# Patient Record
Sex: Female | Born: 1970 | Race: White | Hispanic: No | Marital: Married
Health system: Midwestern US, Community
[De-identification: ages and names within clinical notes are randomized; demographics above are authoritative.]

## PROBLEM LIST (undated history)

## (undated) DIAGNOSIS — E282 Polycystic ovarian syndrome: Secondary | ICD-10-CM

## (undated) DIAGNOSIS — G8929 Other chronic pain: Secondary | ICD-10-CM

## (undated) DIAGNOSIS — G4700 Insomnia, unspecified: Secondary | ICD-10-CM

## (undated) DIAGNOSIS — K219 Gastro-esophageal reflux disease without esophagitis: Secondary | ICD-10-CM

## (undated) DIAGNOSIS — N200 Calculus of kidney: Secondary | ICD-10-CM

## (undated) DIAGNOSIS — E039 Hypothyroidism, unspecified: Secondary | ICD-10-CM

## (undated) DIAGNOSIS — R519 Headache, unspecified: Secondary | ICD-10-CM

## (undated) DIAGNOSIS — C50919 Malignant neoplasm of unspecified site of unspecified female breast: Secondary | ICD-10-CM

## (undated) DIAGNOSIS — R51 Headache: Secondary | ICD-10-CM

## (undated) DIAGNOSIS — N879 Dysplasia of cervix uteri, unspecified: Secondary | ICD-10-CM

## (undated) DIAGNOSIS — C801 Malignant (primary) neoplasm, unspecified: Secondary | ICD-10-CM

## (undated) DIAGNOSIS — Z8489 Family history of other specified conditions: Secondary | ICD-10-CM

## (undated) DIAGNOSIS — Z170 Estrogen receptor positive status [ER+]: Secondary | ICD-10-CM

## (undated) DIAGNOSIS — E1165 Type 2 diabetes mellitus with hyperglycemia: Principal | ICD-10-CM

## (undated) DIAGNOSIS — R059 Cough, unspecified: Principal | ICD-10-CM

## (undated) DIAGNOSIS — L680 Hirsutism: Secondary | ICD-10-CM

## (undated) DIAGNOSIS — C50911 Malignant neoplasm of unspecified site of right female breast: Principal | ICD-10-CM

## (undated) DIAGNOSIS — R079 Chest pain, unspecified: Secondary | ICD-10-CM

## (undated) DIAGNOSIS — K2970 Gastritis, unspecified, without bleeding: Secondary | ICD-10-CM

## (undated) DIAGNOSIS — N858 Other specified noninflammatory disorders of uterus: Secondary | ICD-10-CM

## (undated) DIAGNOSIS — R739 Hyperglycemia, unspecified: Secondary | ICD-10-CM

## (undated) HISTORY — DX: Headache: R51

## (undated) HISTORY — DX: Gastro-esophageal reflux disease without esophagitis: K21.9

## (undated) HISTORY — PX: COLPOSCOPY: SHX161

## (undated) HISTORY — PX: BREAST BIOPSY: SHX20

## (undated) HISTORY — PX: EYE SURGERY: SHX253

## (undated) HISTORY — DX: Dysplasia of cervix uteri, unspecified: N87.9

## (undated) HISTORY — DX: Headache, unspecified: R51.9

## (undated) HISTORY — DX: Hypothyroidism, unspecified: E03.9

## (undated) HISTORY — DX: Polycystic ovarian syndrome: E28.2

## (undated) HISTORY — DX: Other chronic pain: G89.29

---

## 2000-09-14 HISTORY — PX: CERVICAL BIOPSY  W/ LOOP ELECTRODE EXCISION: SUR135

## 2003-09-23 ENCOUNTER — Emergency Department (HOSPITAL_COMMUNITY): Admission: EM | Admit: 2003-09-23 | Discharge: 2003-09-23 | Payer: Self-pay | Admitting: Emergency Medicine

## 2003-09-25 ENCOUNTER — Emergency Department (HOSPITAL_COMMUNITY): Admission: EM | Admit: 2003-09-25 | Discharge: 2003-09-25 | Payer: Self-pay

## 2004-02-01 ENCOUNTER — Other Ambulatory Visit: Admission: RE | Admit: 2004-02-01 | Discharge: 2004-02-01 | Payer: Self-pay | Admitting: Obstetrics and Gynecology

## 2004-12-29 ENCOUNTER — Emergency Department (HOSPITAL_COMMUNITY): Admission: EM | Admit: 2004-12-29 | Discharge: 2004-12-29 | Payer: Self-pay | Admitting: Emergency Medicine

## 2005-02-03 ENCOUNTER — Other Ambulatory Visit: Admission: RE | Admit: 2005-02-03 | Discharge: 2005-02-03 | Payer: Self-pay | Admitting: Obstetrics and Gynecology

## 2006-02-11 ENCOUNTER — Other Ambulatory Visit: Admission: RE | Admit: 2006-02-11 | Discharge: 2006-02-11 | Payer: Self-pay | Admitting: Obstetrics and Gynecology

## 2006-06-28 ENCOUNTER — Encounter: Admission: RE | Admit: 2006-06-28 | Discharge: 2006-06-28 | Payer: Self-pay | Admitting: Gastroenterology

## 2007-02-25 ENCOUNTER — Other Ambulatory Visit: Admission: RE | Admit: 2007-02-25 | Discharge: 2007-02-25 | Payer: Self-pay | Admitting: Addiction Medicine

## 2007-06-29 ENCOUNTER — Encounter: Admission: RE | Admit: 2007-06-29 | Discharge: 2007-06-29 | Payer: Self-pay | Admitting: Endocrinology

## 2007-10-21 ENCOUNTER — Encounter: Admission: RE | Admit: 2007-10-21 | Discharge: 2007-10-21 | Payer: Self-pay | Admitting: Obstetrics and Gynecology

## 2008-02-28 ENCOUNTER — Other Ambulatory Visit: Admission: RE | Admit: 2008-02-28 | Discharge: 2008-02-28 | Payer: Self-pay | Admitting: Obstetrics and Gynecology

## 2008-10-11 ENCOUNTER — Ambulatory Visit: Payer: Self-pay | Admitting: Obstetrics and Gynecology

## 2008-10-12 ENCOUNTER — Ambulatory Visit: Payer: Self-pay | Admitting: Obstetrics and Gynecology

## 2009-03-07 ENCOUNTER — Ambulatory Visit: Payer: Self-pay | Admitting: Obstetrics and Gynecology

## 2009-03-07 ENCOUNTER — Other Ambulatory Visit: Admission: RE | Admit: 2009-03-07 | Discharge: 2009-03-07 | Payer: Self-pay | Admitting: Obstetrics and Gynecology

## 2009-03-07 ENCOUNTER — Encounter: Payer: Self-pay | Admitting: Obstetrics and Gynecology

## 2010-03-21 ENCOUNTER — Other Ambulatory Visit: Admission: RE | Admit: 2010-03-21 | Discharge: 2010-03-21 | Payer: Self-pay | Admitting: Obstetrics and Gynecology

## 2010-03-21 ENCOUNTER — Ambulatory Visit: Payer: Self-pay | Admitting: Obstetrics and Gynecology

## 2010-10-05 ENCOUNTER — Encounter: Payer: Self-pay | Admitting: Endocrinology

## 2010-12-25 ENCOUNTER — Other Ambulatory Visit: Payer: Self-pay | Admitting: Family Medicine

## 2010-12-26 ENCOUNTER — Ambulatory Visit
Admission: RE | Admit: 2010-12-26 | Discharge: 2010-12-26 | Disposition: A | Discharge status: Home / Self Care | Payer: BC Managed Care – PPO | Source: Ambulatory Visit | Attending: Family Medicine | Admitting: Family Medicine

## 2011-03-24 ENCOUNTER — Encounter (INDEPENDENT_AMBULATORY_CARE_PROVIDER_SITE_OTHER): Payer: BC Managed Care – PPO | Admitting: Obstetrics and Gynecology

## 2011-03-24 ENCOUNTER — Other Ambulatory Visit: Payer: Self-pay | Admitting: Obstetrics and Gynecology

## 2011-03-24 ENCOUNTER — Other Ambulatory Visit (HOSPITAL_COMMUNITY)
Admission: RE | Admit: 2011-03-24 | Discharge: 2011-03-24 | Disposition: A | Discharge status: Home / Self Care | Payer: BC Managed Care – PPO | Source: Ambulatory Visit | Attending: Obstetrics and Gynecology | Admitting: Obstetrics and Gynecology

## 2011-03-24 DIAGNOSIS — Z01419 Encounter for gynecological examination (general) (routine) without abnormal findings: Secondary | ICD-10-CM

## 2011-03-24 DIAGNOSIS — B373 Candidiasis of vulva and vagina: Secondary | ICD-10-CM

## 2011-03-24 DIAGNOSIS — N912 Amenorrhea, unspecified: Secondary | ICD-10-CM

## 2011-03-24 DIAGNOSIS — Z124 Encounter for screening for malignant neoplasm of cervix: Secondary | ICD-10-CM | POA: Insufficient documentation

## 2011-03-27 ENCOUNTER — Other Ambulatory Visit: Payer: BC Managed Care – PPO

## 2011-03-27 ENCOUNTER — Ambulatory Visit (INDEPENDENT_AMBULATORY_CARE_PROVIDER_SITE_OTHER): Payer: BC Managed Care – PPO | Admitting: Obstetrics and Gynecology

## 2011-03-27 DIAGNOSIS — B373 Candidiasis of vulva and vagina: Secondary | ICD-10-CM

## 2011-03-27 DIAGNOSIS — N912 Amenorrhea, unspecified: Secondary | ICD-10-CM

## 2011-03-27 DIAGNOSIS — N949 Unspecified condition associated with female genital organs and menstrual cycle: Secondary | ICD-10-CM

## 2011-03-27 DIAGNOSIS — N915 Oligomenorrhea, unspecified: Secondary | ICD-10-CM

## 2011-04-20 ENCOUNTER — Other Ambulatory Visit: Payer: Self-pay | Admitting: *Deleted

## 2011-04-20 DIAGNOSIS — R609 Edema, unspecified: Secondary | ICD-10-CM

## 2011-04-20 MED ORDER — SPIRONOLACTONE 50 MG PO TABS: ORAL_TABLET | ORAL | Status: DC

## 2011-09-29 ENCOUNTER — Encounter: Payer: Self-pay | Admitting: Pulmonary Disease

## 2011-09-30 ENCOUNTER — Encounter: Payer: Self-pay | Admitting: Pulmonary Disease

## 2011-09-30 ENCOUNTER — Ambulatory Visit (INDEPENDENT_AMBULATORY_CARE_PROVIDER_SITE_OTHER): Payer: BC Managed Care – PPO | Admitting: Pulmonary Disease

## 2011-09-30 VITALS — BP 140/92 | HR 103 | Temp 98.4°F | Ht 62.0 in | Wt 213.0 lb

## 2011-09-30 DIAGNOSIS — R053 Chronic cough: Secondary | ICD-10-CM | POA: Insufficient documentation

## 2011-09-30 DIAGNOSIS — R059 Cough, unspecified: Secondary | ICD-10-CM

## 2011-09-30 DIAGNOSIS — R05 Cough: Secondary | ICD-10-CM

## 2011-09-30 MED ORDER — BENZONATATE 100 MG PO CAPS: 200.0000 mg | ORAL_CAPSULE | Freq: Four times a day (QID) | ORAL | Status: AC | PRN

## 2011-09-30 MED ORDER — HYDROCOD POLST-CPM POLST ER 10-8 MG PO CP12: 1.0000 | ORAL_CAPSULE | Freq: Two times a day (BID) | ORAL | Status: DC | PRN

## 2011-09-30 NOTE — Patient Instructions (Signed)
Will treat with the cyclical cough protocol.  See handout. No throat clearing, hard candy (no menthol or mint), minimal voice use, etc. Please call us about 3 days after protocol completed, to give update. Remember, once protocol is done, need to limit voice use as much as possible

## 2011-09-30 NOTE — Assessment & Plan Note (Signed)
The patient is describing a classic cyclical cough.  There is nothing to suggest cough variant asthma, and she really does not have a lot of postnasal drip or reflux.  These factors could be contributing to the cough, but I do not think is the main driving force.  Treatment for this will require total cough suppression with narcotics, complete voice rest, and behavioral therapies which will limit stimulation to the back of the throat.  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  I think this is one reason why this has not improved over time.  We'll start the patient on the cyclical cough protocol, and have her call me with her progress after this is completed.

## 2011-09-30 NOTE — Progress Notes (Signed)
  Subjective:    Patient ID: Joan Valdez, female    DOB: 01/02/1971, 41 y.o.   MRN: [id number]  HPI The patient is a 41 year old female who is referred today for a chronic cough.  She was in her usual state of health until November of last year, when she awakened from sleep one day with chest discomfort and cough.  She had no upper respiratory infection symptoms, and no mucus production.  She never really felt sick.  The cough then escalated and became very severe, and developed cough paroxysms.  She was treated with a course of Levaquin, and then another antibiotic with some improvement.  She was tried on a steroid inhaler, topical nasal steroids, and b.i.d. Proton pump inhibitor for approximately 4 weeks.  She had no improvement with this.  She has had a Bordetella screen done which was unremarkable, and her chest x-ray in December was clear.  She has had from a tree in the distant past which was normal.  The patient's cough is dry and hacking in nature, and she describes a classic globus sensation.  She has had continuous throat clearing that is from a tickle, as well as a lot of hoarseness.  The cough is definitely worse with increased conversation.  The patient most recently has had some postnasal drip, but it has not been significant.  She denies any significant reflux symptoms, but has had some regurgitation at times during her cough paroxysms.   Review of Systems  Constitutional: Negative for fever and unexpected weight change.  HENT: Negative for ear pain, nosebleeds, congestion, sore throat, rhinorrhea, sneezing, trouble swallowing, dental problem, postnasal drip and sinus pressure.   Eyes: Negative for redness and itching.  Respiratory: Positive for cough. Negative for chest tightness, shortness of breath and wheezing.   Cardiovascular: Negative for palpitations and leg swelling.  Gastrointestinal: Negative for nausea and vomiting.  Genitourinary: Negative for dysuria.  Musculoskeletal:  Negative for joint swelling.  Skin: Negative for rash.  Neurological: Positive for headaches.  Hematological: Does not bruise/bleed easily.  Psychiatric/Behavioral: Negative for dysphoric mood. The patient is not nervous/anxious.        Objective:   Physical Exam Constitutional:  Overweight female, no acute distress  HENT:  Nares patent without discharge  Oropharynx without exudate, palate and uvula are normal  Eyes:  Perrla, eomi, no scleral icterus  Neck:  No JVD, no TMG  Cardiovascular:  Normal rate, regular rhythm, no rubs or gallops.  No murmurs        Intact distal pulses  Pulmonary :  Normal breath sounds, no stridor or respiratory distress   No rales, rhonchi, or wheezing  Abdominal:  Soft, nondistended, bowel sounds present.  No tenderness noted.   Musculoskeletal:  No lower extremity edema noted.  Lymph Nodes:  No cervical lymphadenopathy noted  Skin:  No cyanosis noted  Neurologic:  Alert, appropriate, moves all 4 extremities without obvious deficit.         Assessment & Plan:

## 2011-10-07 ENCOUNTER — Telehealth: Payer: Self-pay | Admitting: Pulmonary Disease

## 2011-10-07 NOTE — Telephone Encounter (Signed)
I spoke with the pt and she states she finished the 3 days of rest on Monday fro cyclical cough and was told to call with an update. She states on Monday she would rate her cough at 90% better, but the longer she has been off the complete voice rest her cough is gradually getting worse again. She states she would rate it today at 70% better, which I advised was still a great improvement.  She states she is still taking tessalon perles as directed, resting her voice as much as possible, keeping hard candy in her mouth, and all the other cyclical cough instructions. She wanted to let [facility] know where she was and if he has any other recs at this time.  Please advise. Carron Curie, CMA Allergies  Allergen Reactions  . Amoxicillin Swelling    TONGUE/THROAT SWELLING  . Bactrim     TONGUE/THROAT SWELLING.  . Erythromycin     TONGUE/THROAT SWELLING.  . Imitrex ([employer])   . Keflex     TONGUE/THROAT SWELLING.  Marland Kitchen Penicillins     TONGUE/THROAT SWELLING.  . Sulfa Antibiotics     TONGUE/THROAT SWELLING.  Marland Kitchen Zithromax (Azithromycin Dihydrate)     TONGUE/THROAT SWELLING.

## 2011-10-07 NOTE — Telephone Encounter (Signed)
Let her know this is very good improvement in a short period of time.  We had discussed how hard this will be with her occupation.  She needs to continue exactly what she is doing now, and see how things go for the next 2-3 weeks.  The longer she can keep her cough from escalating, the better it should get.  Give [place] feedback again in 2-3 weeks,  But call if it suddenly begins to increase again.

## 2011-10-08 NOTE — Telephone Encounter (Signed)
I spoke with pt and she states that at first she was 90% better, then the second day she was 70% better, and today she is back down to 50%. She states she is afraid she is going down hill fast. Pt is still using the tessalon pearls, trying to rest her voice as much as possible, using hard candy to bathe the back of her throat, she is drinking water when she feels the urge to cough and she is not clearing her throat. Pt is requesting recs from Dr. Shelle Iron. Please advise thanks

## 2011-10-08 NOTE — Telephone Encounter (Signed)
The only thing we can do is to try tramadol during the day instead of tessalon pearls, but with the understanding it may make her sleepy or feel funny.  If she wishes to do this, tramadol 50mg  one every 6 hrs as needed for cough. (30, one fill)  The other thing we can do is to do the cyclical cough protocol again with 3 days of voice rest.  Let me know what she wishes to do.

## 2011-10-08 NOTE — Telephone Encounter (Signed)
I spoke with pt and she states she is going to try the cyclical cough protocol again w/ 3 days voice rest. Pt states she has plenty of tessalon pearls left but will need rx for the tussicaps. Pt aware KC is out of the office until tomorrow and was fine with it being addressed then. Please advise the quantity for pt's tussicaps, Dr. Shelle Iron, thanks  [facility] [place]

## 2011-10-08 NOTE — Telephone Encounter (Signed)
Pt called back. Joan Valdez

## 2011-10-09 MED ORDER — HYDROCOD POLST-CPM POLST ER 10-8 MG PO CP12: 1.0000 | ORAL_CAPSULE | Freq: Two times a day (BID) | ORAL | Status: DC | PRN

## 2011-10-09 NOTE — Telephone Encounter (Signed)
Pt called to make sure rx will be called in asap this morning (due to weather). Pt # [id number]. Joan Valdez

## 2011-10-09 NOTE — Telephone Encounter (Signed)
Ok to call in tussicaps 8mg  and put on prescription q12h as needed.  #12, no fills But, remind her to take according to protocol, not what is on the prescription.  Continue behavioral treatments as well.  Give me feedback after she has been off meds for a week or so.

## 2011-10-09 NOTE — Telephone Encounter (Signed)
Called spoke with patient, advised of KC's recs regarding the tussicaps and to call with update after being off meds x1 week or so.  Pt verbalized her understanding.  rx telephoned to verified pharmacy.  Nothing further needed - will sign off.

## 2011-10-19 ENCOUNTER — Telehealth: Payer: Self-pay | Admitting: Pulmonary Disease

## 2011-10-19 MED ORDER — BENZONATATE 100 MG PO CAPS: 100.0000 mg | ORAL_CAPSULE | Freq: Four times a day (QID) | ORAL | Status: AC | PRN

## 2011-10-19 NOTE — Telephone Encounter (Signed)
Called and spoke with pt. Pt states she was to call and give update on her cough.  She states she is doing "much better."  States cough is 75% to 80% better. Pt finished Tussicaps and only has approx 3 tabs left of the [employer].  Pt states she is still using the Tessalon perles to help with her cough and would like an rx sent to pharmacy for this. Pt states she is trying to limit voice as much as possible and is still sucking on hard candy to bathe back of throat.  Pt states cough is non productive.  KC, please advise if ok to refill Tessalon perles.  Thanks!

## 2011-10-19 NOTE — Telephone Encounter (Signed)
Called and spoke with pt. Pt aware of KC's response/ recs.  And rx sent to pharmacy.   

## 2011-10-19 NOTE — Telephone Encounter (Signed)
Ok to fill tessalon pearls. She is to call if the cough does not totally resolve.

## 2011-10-21 ENCOUNTER — Emergency Department (HOSPITAL_COMMUNITY)
Admission: EM | Admit: 2011-10-21 | Discharge: 2011-10-21 | Disposition: A | Discharge status: Home / Self Care | Payer: BC Managed Care – PPO | Attending: Emergency Medicine | Admitting: Emergency Medicine

## 2011-10-21 ENCOUNTER — Emergency Department (HOSPITAL_COMMUNITY)
Admission: EM | Admit: 2011-10-21 | Discharge: 2011-10-21 | Disposition: A | Discharge status: Nursing Home (Includes ICF) | Payer: BC Managed Care – PPO | Source: Home / Self Care

## 2011-10-21 ENCOUNTER — Encounter (HOSPITAL_COMMUNITY): Payer: Self-pay | Admitting: *Deleted

## 2011-10-21 ENCOUNTER — Encounter (HOSPITAL_COMMUNITY): Payer: Self-pay | Admitting: Emergency Medicine

## 2011-10-21 DIAGNOSIS — K5289 Other specified noninfective gastroenteritis and colitis: Secondary | ICD-10-CM | POA: Insufficient documentation

## 2011-10-21 DIAGNOSIS — Z79899 Other long term (current) drug therapy: Secondary | ICD-10-CM | POA: Insufficient documentation

## 2011-10-21 DIAGNOSIS — R1915 Other abnormal bowel sounds: Secondary | ICD-10-CM | POA: Insufficient documentation

## 2011-10-21 DIAGNOSIS — E860 Dehydration: Secondary | ICD-10-CM

## 2011-10-21 DIAGNOSIS — K529 Noninfective gastroenteritis and colitis, unspecified: Secondary | ICD-10-CM

## 2011-10-21 DIAGNOSIS — R197 Diarrhea, unspecified: Secondary | ICD-10-CM | POA: Insufficient documentation

## 2011-10-21 DIAGNOSIS — R109 Unspecified abdominal pain: Secondary | ICD-10-CM | POA: Insufficient documentation

## 2011-10-21 DIAGNOSIS — E039 Hypothyroidism, unspecified: Secondary | ICD-10-CM | POA: Insufficient documentation

## 2011-10-21 DIAGNOSIS — R1110 Vomiting, unspecified: Secondary | ICD-10-CM | POA: Insufficient documentation

## 2011-10-21 LAB — URINALYSIS, ROUTINE W REFLEX MICROSCOPIC
Glucose, UA: NEGATIVE mg/dL
Leukocytes, UA: NEGATIVE
Protein, ur: NEGATIVE mg/dL
Specific Gravity, Urine: 1.026 (ref 1.005–1.030)
pH: 5.5 (ref 5.0–8.0)

## 2011-10-21 LAB — POCT URINALYSIS DIP (DEVICE)
Glucose, UA: NEGATIVE mg/dL
Leukocytes, UA: NEGATIVE
Nitrite: NEGATIVE
Specific Gravity, Urine: 1.02 (ref 1.005–1.030)
Urobilinogen, UA: 0.2 mg/dL (ref 0.0–1.0)

## 2011-10-21 LAB — BASIC METABOLIC PANEL
CO2: 20 mEq/L (ref 19–32)
Chloride: 103 mEq/L (ref 96–112)
Potassium: 3.6 mEq/L (ref 3.5–5.1)
Sodium: 138 mEq/L (ref 135–145)

## 2011-10-21 LAB — CBC
HCT: 46.1 % — ABNORMAL HIGH (ref 36.0–46.0)
Platelets: 204 10*3/uL (ref 150–400)
RBC: 5.42 MIL/uL — ABNORMAL HIGH (ref 3.87–5.11)
RDW: 12.5 % (ref 11.5–15.5)
WBC: 8.9 10*3/uL (ref 4.0–10.5)

## 2011-10-21 LAB — POCT PREGNANCY, URINE
Preg Test, Ur: NEGATIVE
Preg Test, Ur: NEGATIVE

## 2011-10-21 LAB — DIFFERENTIAL
Basophils Absolute: 0 10*3/uL (ref 0.0–0.1)
Lymphocytes Relative: 9 % — ABNORMAL LOW (ref 12–46)
Neutro Abs: 7.6 10*3/uL (ref 1.7–7.7)

## 2011-10-21 MED ORDER — SODIUM CHLORIDE 0.9 % IV SOLN: INTRAVENOUS | Status: DC

## 2011-10-21 MED ORDER — ONDANSETRON 4 MG PO TBDP
8.0000 mg | ORAL_TABLET | Freq: Once | ORAL | Status: AC
  Administered 2011-10-21: 8 mg via ORAL

## 2011-10-21 MED ORDER — ONDANSETRON HCL 4 MG/2ML IJ SOLN
4.0000 mg | Freq: Once | INTRAMUSCULAR | Status: AC
  Administered 2011-10-21: 4 mg via INTRAVENOUS
  Filled 2011-10-21: qty 2

## 2011-10-21 MED ORDER — SODIUM CHLORIDE 0.9 % IV BOLUS (SEPSIS)
1000.0000 mL | Freq: Once | INTRAVENOUS | Status: AC
  Administered 2011-10-21: 1000 mL via INTRAVENOUS

## 2011-10-21 MED ORDER — ONDANSETRON HCL 4 MG PO TABS: 4.0000 mg | ORAL_TABLET | Freq: Four times a day (QID) | ORAL | Status: AC

## 2011-10-21 MED ORDER — ONDANSETRON 4 MG PO TBDP
ORAL_TABLET | ORAL | Status: AC
  Filled 2011-10-21: qty 2

## 2011-10-21 NOTE — ED Provider Notes (Signed)
History     CSN: [id number]  Arrival date & time 10/21/11  9562   None     Chief Complaint  Patient presents with  . GI Problem  . Emesis    (Consider location/radiation/quality/duration/timing/severity/associated sxs/prior treatment) HPI Comments: Pt states she awoke at approx 11:00 pm last night from sleep with N/V/D and stomach cramping pain. She has had at least 20-30 episodes of vomiting and diarrhea simultaneously. She has been unable to drink fluids due to nausea and vomiting. She is uncertain her last void. The diarrhea is watery and rust colored. No recent abx or travel. 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 She also c/o HA and dizziness.    Past Medical History  Diagnosis Date  . PCO (polycystic ovaries)   . Hypothyroid   . Allergic rhinitis   . Chronic headache     Past Surgical History  Procedure Date  . Cesarean section 2001  . Cervical biopsy  w/ loop electrode excision     Family History  Problem Relation Age of Onset  . Hypertension Mother   . Diabetes Paternal Grandmother   . Hypertension Paternal Grandmother   . Heart disease Maternal Grandmother   . Rheum arthritis Paternal Grandmother     History  Substance Use Topics  . Smoking status: Never Smoker   . Smokeless tobacco: Not on file  . Alcohol Use: Yes     socially.    OB History    Grav Para Term Preterm Abortions TAB SAB Ect Mult Living                  Review of Systems  Constitutional: Positive for appetite change. Negative for fever and chills.  Gastrointestinal: Positive for nausea, vomiting, abdominal pain and diarrhea. Negative for constipation.  Genitourinary: Positive for decreased urine volume. Negative for dysuria and frequency.  Neurological: Positive for dizziness and headaches.    Allergies  Amoxicillin; Bactrim; Erythromycin; Imitrex; Keflex; Penicillins; Sulfa antibiotics; and Zithromax  Home Medications   Current Outpatient Rx  Name Route Sig  Dispense Refill  . LEVOTHYROXINE SODIUM 100 MCG PO TABS Oral Take 100 mcg by mouth daily.      Marland Kitchen LIOTHYRONINE SODIUM 25 MCG PO TABS Oral Take 12.5 mcg by mouth 2 (two) times daily.     Marland Kitchen OMEPRAZOLE 20 MG PO CPDR Oral Take 20 mg by mouth daily.      Marland Kitchen BENZONATATE 100 MG PO CAPS Oral Take 1 capsule (100 mg total) by mouth every 6 (six) hours as needed for cough. 30 capsule 0  . DESOGESTREL-ETHINYL ESTRADIOL 0.15-0.02/0.01 MG (21/5) PO TABS Oral Take 1 tablet by mouth daily.      Marland Kitchen HYDROCOD POLST-CPM POLST ER 10-8 MG PO CP12 Oral Take 1 capsule by mouth every 12 (twelve) hours as needed. 12 each 0  . SPIRONOLACTONE 50 MG PO TABS  Take one tablet twice a day 60 tablet 12  . TOPIRAMATE 50 MG PO TABS Oral Take 75 mg by mouth daily.      BP 132/88  Pulse 142  Temp(Src) 98.2 F (36.8 C) (Oral)  Resp 20  SpO2 98%  Physical Exam  Constitutional: She appears well-developed and well-nourished. No distress.  HENT:  Head: Normocephalic and atraumatic.  Right Ear: Tympanic membrane, external ear and ear canal normal.  Left Ear: Tympanic membrane, external ear and ear canal normal.  Nose: Nose normal.  Mouth/Throat: Uvula is midline and oropharynx is clear and moist. Mucous membranes  are dry. No oropharyngeal exudate, posterior oropharyngeal edema or posterior oropharyngeal erythema.  Neck: Neck supple.  Cardiovascular: Regular rhythm and normal heart sounds.  Tachycardia present.   Pulmonary/Chest: Effort normal and breath sounds normal. No respiratory distress.  Abdominal: Normal appearance and bowel sounds are normal. She exhibits no mass. There is no hepatosplenomegaly. There is tenderness (mild diffuse TTP). There is no rebound, no guarding and no CVA tenderness.  Lymphadenopathy:    She has no cervical adenopathy.  Neurological: She is alert.  Skin: Skin is warm and dry.  Psychiatric: She has a normal mood and affect.    ED Course  Procedures (including critical care time)  Labs  Reviewed  POCT URINALYSIS DIP (DEVICE) - Abnormal; Notable for the following:    Bilirubin Urine SMALL (*)    Ketones, ur 15 (*)    All other components within normal limits  POCT PREGNANCY, URINE  POCT PREGNANCY, URINE   No results found.   1. Dehydration   2. Gastroenteritis       MDM  Pt transferred to [facility].         Melody Comas, [place] 10/21/11 [phone]

## 2011-10-21 NOTE — ED Notes (Signed)
Pt states n/v/d started at 2300 last night. States "I threw up every 10 minutes". Has not thrown up since 0600 today. [facility] and given Zofran 8mg  PO, pt states did not help with the nausea. Pt is a 2nd grade teacher.

## 2011-10-21 NOTE — ED Notes (Signed)
Pt reports n/v/d associated with generalized abdominal pain that started last night. Reports unable to keep anything down. States she feels very thirsty. Pt was sent from urgent care.

## 2011-10-21 NOTE — ED Provider Notes (Signed)
History     CSN: [id number]  Arrival date & time 10/21/11  4098   First MD Initiated Contact with Patient 10/21/11 1058      Chief Complaint  Patient presents with  . Emesis  . Diarrhea  . Abdominal Pain    (Consider location/radiation/quality/duration/timing/severity/associated sxs/prior treatment) HPI Joan Valdez is a 41 y.o. female who has been ill since yesterday with vomiting and diarrhea. The emesis is persistent, and appears greenish in color, and is liquid in consistency. Her stool is dark green color, but no evidence of blood. She's not been having a fever. 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 She denies chest pain, weakness, dizziness.      Past Medical History  Diagnosis Date  . PCO (polycystic ovaries)   . Hypothyroid   . Allergic rhinitis   . Chronic headache     Past Surgical History  Procedure Date  . Cesarean section 2001  . Cervical biopsy  w/ loop electrode excision     Family History  Problem Relation Age of Onset  . Hypertension Mother   . Diabetes Paternal Grandmother   . Hypertension Paternal Grandmother   . Heart disease Maternal Grandmother   . Rheum arthritis Paternal Grandmother     History  Substance Use Topics  . Smoking status: Never Smoker   . Smokeless tobacco: Not on file  . Alcohol Use: Yes     socially.    OB History    Grav Para Term Preterm Abortions TAB SAB Ect Mult Living                  Review of Systems  All other systems reviewed and are negative.    Allergies  Imitrex; Amoxicillin; Bactrim; Erythromycin; Keflex; Penicillins; Strawberry; Sulfa antibiotics; and Zithromax  Home Medications   Current Outpatient Rx  Name Route Sig Dispense Refill  . BENZONATATE 100 MG PO CAPS Oral Take 1 capsule (100 mg total) by mouth every 6 (six) hours as needed for cough. 30 capsule 0  . DESOGESTREL-ETHINYL ESTRADIOL 0.15-0.02/0.01 MG (21/5) PO TABS Oral Take 1 tablet by mouth daily.     Marland Kitchen  DIPHENHYDRAMINE HCL 25 MG PO TABS Oral Take 50 mg by mouth every 6 (six) hours as needed. Sinus headache    . HYDROCOD POLST-CPM POLST ER 10-8 MG PO CP12 Oral Take 1 capsule by mouth every 12 (twelve) hours as needed. 12 each 0  . IBUPROFEN 200 MG PO TABS Oral Take 400 mg by mouth every 8 (eight) hours as needed. For pain    . LEVOTHYROXINE SODIUM 100 MCG PO TABS Oral Take 100 mcg by mouth daily.     Marland Kitchen LIOTHYRONINE SODIUM 25 MCG PO TABS Oral Take 12.5 mcg by mouth 2 (two) times daily.     Marland Kitchen OMEPRAZOLE 20 MG PO CPDR Oral Take 20 mg by mouth daily.      Marland Kitchen SPIRONOLACTONE 50 MG PO TABS Oral Take 50 mg by mouth 2 (two) times daily. Take one tablet twice a day    . TOPIRAMATE 50 MG PO TABS Oral Take 75 mg by mouth daily.    Marland Kitchen ONDANSETRON HCL 4 MG PO TABS Oral Take 1 tablet (4 mg total) by mouth every 6 (six) hours. 12 tablet 0    BP 112/77  Pulse 107  Temp(Src) 99.4 F (37.4 C) (Oral)  Resp 20  SpO2 100%  Physical Exam  Nursing note and vitals reviewed. Constitutional: She is oriented to  person, place, and time. She appears well-developed and well-nourished.  HENT:  Head: Normocephalic and atraumatic.  Eyes: Conjunctivae and EOM are normal. Pupils are equal, round, and reactive to light.  Neck: Normal range of motion and phonation normal. Neck supple.  Cardiovascular: Normal rate, regular rhythm and intact distal pulses.   Pulmonary/Chest: Effort normal and breath sounds normal. She exhibits no tenderness.  Abdominal: Soft. She exhibits no distension. There is no tenderness. There is no guarding.       Hyperactive bowel sounds  Musculoskeletal: Normal range of motion.  Neurological: She is alert and oriented to person, place, and time. She has normal strength. She exhibits normal muscle tone.  Skin: Skin is warm and dry.  Psychiatric: She has a normal mood and affect. Her behavior is normal. Judgment and thought content normal.    ED Course  Procedures (including critical care  time)  Emergency department treatment: IV fluids, bolus, and drip. IV Zofran.  4:19 PM Reevaluation with update and discussion. After initial assessment and treatment, an updated evaluation reveals  she is tolerating oral fluids. She has not vomited since 7 AM. . Aubreyanna Dorrough L      Labs Reviewed  CBC - Abnormal; Notable for the following:    RBC 5.42 (*)    Hemoglobin 17.0 (*)    HCT 46.1 (*)    MCHC 36.9 (*)    All other components within normal limits  DIFFERENTIAL - Abnormal; Notable for the following:    Neutrophils Relative 86 (*)    Lymphocytes Relative 9 (*)    All other components within normal limits  BASIC METABOLIC PANEL - Abnormal; Notable for the following:    Glucose, Bld 105 (*)    All other components within normal limits  URINALYSIS, ROUTINE W REFLEX MICROSCOPIC - Abnormal; Notable for the following:    Bilirubin Urine SMALL (*)    Ketones, ur 40 (*)    All other components within normal limits  URINE CULTURE   No results found.   1. Gastroenteritis       MDM   Labs c/w mild dehydration. She is improved in the ED with IVF and Zofran. Suspect acute Gastroenteritis.           Flint Melter, MD 10/21/11 1620

## 2011-10-21 NOTE — ED Notes (Signed)
PT HERE WITH POSS STOMACH VIRUS THAT STARTED LAST NIGHT WITH CONSTANT VOMITING AND DIARRHEA AT THE SAME TIME AND ABD CRAMPING.LAST EMESIS 4 HRS AGO.PT UNABLE TO KEEP FLUIDS OR FOOD DOWN.WEAKNESS ALSO REPORTED.AFEBRILE

## 2011-10-23 LAB — URINE CULTURE
Colony Count: >=100000
Culture  Setup Time: 201302061346

## 2011-10-26 NOTE — ED Provider Notes (Signed)
Medical screening examination/treatment/procedure(s) were performed by non-physician practitioner and as supervising physician I was immediately available for consultation/collaboration.  LANEY,RONNIE   Ronnie Laney, MD 10/26/11 1434 

## 2012-03-22 ENCOUNTER — Other Ambulatory Visit: Payer: Self-pay | Admitting: Obstetrics and Gynecology

## 2012-03-24 ENCOUNTER — Ambulatory Visit (INDEPENDENT_AMBULATORY_CARE_PROVIDER_SITE_OTHER): Payer: BC Managed Care – PPO | Admitting: Obstetrics and Gynecology

## 2012-03-24 ENCOUNTER — Encounter: Payer: Self-pay | Admitting: Obstetrics and Gynecology

## 2012-03-24 VITALS — BP 124/78 | Ht 63.0 in | Wt 204.0 lb

## 2012-03-24 DIAGNOSIS — K219 Gastro-esophageal reflux disease without esophagitis: Secondary | ICD-10-CM | POA: Insufficient documentation

## 2012-03-24 DIAGNOSIS — N879 Dysplasia of cervix uteri, unspecified: Secondary | ICD-10-CM | POA: Insufficient documentation

## 2012-03-24 DIAGNOSIS — Z01419 Encounter for gynecological examination (general) (routine) without abnormal findings: Secondary | ICD-10-CM

## 2012-03-24 MED ORDER — SPIRONOLACTONE 50 MG PO TABS: 50.0000 mg | ORAL_TABLET | Freq: Two times a day (BID) | ORAL | Status: DC

## 2012-03-24 MED ORDER — SPIRONOLACTONE 50 MG PO TABS: ORAL_TABLET | ORAL | Status: DC

## 2012-03-24 MED ORDER — MIRCETTE 0.15-0.02/0.01 MG (21/5) PO TABS: 1.0000 | ORAL_TABLET | Freq: Every day | ORAL | Status: DC

## 2012-03-24 NOTE — Progress Notes (Signed)
Patient came to see me today for her annual GYN exam. We are treating her with birth control pills for polycystic ovarian syndrome, contraception, and hair growth. She is also using spironolactone 100 mgm daily. She is still having a little bit of hair growth. She sees Dr. Juleen China who  Treats  her hypothyroidism. Last year she missed some cycles on her pills and had a normal FSH of 3. She started to have regular cycles again but has not had any withdrawal the last 2 cycles. She had a LEEP for cervical dysplasia in 2002. She has had normal Pap smears since then with her last Pap July, 2012. She is having no pelvic pain.  Physical examination: kim Julian Reil present. HEENT within normal limits. Neck: Thyroid not large. No masses. Supraclavicular nodes: not enlarged. Breasts: Examined in both sitting and lying  position. No skin changes and no masses. Abdomen: Soft no guarding rebound or masses or hernia. Pelvic: External: Within normal limits. BUS: Within normal limits. Vaginal:within normal limits. Good estrogen effect. No evidence of cystocele rectocele or enterocele. Cervix: clean. Uterus: Normal size and shape. Adnexa: No masses. Rectovaginal exam: Confirmatory and negative. Extremities: Within normal limits.  Assessment: #1. Cervical dysplasia #2. Recent amenorrhea #3. Polycystic ovarian syndrome  Plan: Continue Mircette. Patient will let me know if she continues to miss her cycle. Increased her spironolactone to  150 mg daily due to some hair growth on her face.The new Pap smear guidelines were discussed with the patient. No pap done.

## 2012-03-24 NOTE — Patient Instructions (Signed)
Call me if you miss anymore periods.

## 2012-03-25 LAB — URINALYSIS W MICROSCOPIC + REFLEX CULTURE
Bilirubin Urine: NEGATIVE
Casts: NONE SEEN
Crystals: NONE SEEN
Glucose, UA: NEGATIVE mg/dL
Hgb urine dipstick: NEGATIVE
Ketones, ur: NEGATIVE mg/dL
Leukocytes, UA: NEGATIVE
Nitrite: NEGATIVE
Protein, ur: NEGATIVE mg/dL
Specific Gravity, Urine: 1.026 (ref 1.005–1.030)
Urobilinogen, UA: 0.2 mg/dL (ref 0.0–1.0)
pH: 5.5 (ref 5.0–8.0)

## 2012-03-26 LAB — URINE CULTURE

## 2012-05-15 ENCOUNTER — Other Ambulatory Visit: Payer: Self-pay | Admitting: Obstetrics and Gynecology

## 2012-09-14 DIAGNOSIS — N200 Calculus of kidney: Secondary | ICD-10-CM

## 2012-09-14 HISTORY — DX: Calculus of kidney: N20.0

## 2012-12-12 ENCOUNTER — Encounter: Payer: Self-pay | Admitting: Family Medicine

## 2012-12-12 ENCOUNTER — Other Ambulatory Visit: Payer: Self-pay | Admitting: Family Medicine

## 2012-12-12 ENCOUNTER — Ambulatory Visit
Admission: RE | Admit: 2012-12-12 | Discharge: 2012-12-12 | Disposition: A | Discharge status: Home / Self Care | Payer: BC Managed Care – PPO | Source: Ambulatory Visit | Attending: Family Medicine | Admitting: Family Medicine

## 2012-12-12 ENCOUNTER — Ambulatory Visit (INDEPENDENT_AMBULATORY_CARE_PROVIDER_SITE_OTHER): Payer: BC Managed Care – PPO | Admitting: Family Medicine

## 2012-12-12 VITALS — BP 128/74 | HR 90 | Temp 98.7°F | Resp 16 | Wt 210.0 lb

## 2012-12-12 DIAGNOSIS — M549 Dorsalgia, unspecified: Secondary | ICD-10-CM

## 2012-12-12 DIAGNOSIS — M546 Pain in thoracic spine: Secondary | ICD-10-CM

## 2012-12-12 DIAGNOSIS — R0781 Pleurodynia: Secondary | ICD-10-CM

## 2012-12-12 DIAGNOSIS — R079 Chest pain, unspecified: Secondary | ICD-10-CM

## 2012-12-12 MED ORDER — HYDROCORTISONE 2.5 % RE CREA: TOPICAL_CREAM | Freq: Two times a day (BID) | RECTAL | Status: DC

## 2012-12-12 MED ORDER — OXYCODONE-ACETAMINOPHEN 5-325 MG PO TABS: 1.0000 | ORAL_TABLET | Freq: Three times a day (TID) | ORAL | Status: DC | PRN

## 2012-12-12 MED ORDER — CARISOPRODOL 350 MG PO TABS: 350.0000 mg | ORAL_TABLET | Freq: Four times a day (QID) | ORAL | Status: DC | PRN

## 2012-12-12 NOTE — Progress Notes (Signed)
Subjective:     Patient ID: Joan Valdez, female   DOB: 1971/08/28, 41 y.o.   MRN: [id number]  HPI 42 year old here for followup of her mid to lower back pain she was originally seen in March 10. She had one week of pleuritic pain in her left lower ribs to left-sided lower back. This pain followed an upper respiratory infection.  I assumed it was an intercostal muscle strain due to coughing. She was prescribed soma and Percocet. She states now that she is no better. She is an almost constant pain. It is worsened by movement. Any bending or twisting makes it worse. She denies any fever or pleurisy at the present time. She denies any hematuria, dysuria, frequency, urgency, nausea vomiting or diarrhea. Past Medical History  Diagnosis Date  . PCO (polycystic ovaries)   . Hypothyroid   . Allergic rhinitis   . Chronic headache   . Acid reflux   . Cervical dysplasia    Current Outpatient Prescriptions on File Prior to Visit  Medication Sig Dispense Refill  . Cetirizine HCl (ZYRTEC ALLERGY PO) Take by mouth.      . diphenhydrAMINE (BENADRYL) 25 MG tablet Take 50 mg by mouth every 6 (six) hours as needed. Sinus headache      . Eflornithine HCl (VANIQA) 13.9 % cream Apply topically 2 (two) times daily with a meal.      . ibuprofen (ADVIL,MOTRIN) 200 MG tablet Take 400 mg by mouth every 8 (eight) hours as needed. For pain      . levothyroxine (SYNTHROID, LEVOTHROID) 100 MCG tablet Take 100 mcg by mouth daily.       Marland Kitchen liothyronine (CYTOMEL) 25 MCG tablet Take 12.5 mcg by mouth 2 (two) times daily.       Marland Kitchen MIRCETTE 0.15-0.02/0.01 MG (21/5) tablet Take 1 tablet by mouth daily.  28 tablet  15  . mometasone (NASONEX) 50 MCG/ACT nasal spray Place 2 sprays into the nose daily.      Marland Kitchen spironolactone (ALDACTONE) 50 MG tablet TAKE 1 TABLET BY MOUTH TWICE DAILY  60 tablet  10   No current facility-administered medications on file prior to visit.     Review of Systems Review of systems is otherwise  negative    Objective:   Physical Exam  Cardiovascular: Normal rate, regular rhythm and normal heart sounds.   No murmur heard. Pulmonary/Chest: Effort normal and breath sounds normal. No respiratory distress. She has no wheezes.  Musculoskeletal:       Thoracic back: She exhibits decreased range of motion, tenderness and spasm.       Back:   pain is located in the area circled on the diagram. It is tender to palpation there. Pain is over the lower ribs as well as the mid left-back. She has normal reflexes and negative straight leg raise     Assessment:     Low back pain Rib pain    Plan:     I still suspect an intercostal muscle strain versus a lumbar paraspinal muscle strain. We'll obtain a chest x-ray and T-spine x-ray to rule out vertebral or rib pathology.  If x-rays are negative we'll proceed with a physical therapy consult. I refilled her Percocet 5/325 and soma 350 mg.  I gave her 30 tablets of each. Also gave her prescription for Anusol cream due to some hemorrhoids and she's having.

## 2012-12-27 ENCOUNTER — Ambulatory Visit: Payer: BC Managed Care – PPO | Attending: Family Medicine | Admitting: Physical Therapy

## 2012-12-27 DIAGNOSIS — R293 Abnormal posture: Secondary | ICD-10-CM | POA: Insufficient documentation

## 2012-12-27 DIAGNOSIS — M546 Pain in thoracic spine: Secondary | ICD-10-CM | POA: Insufficient documentation

## 2012-12-27 DIAGNOSIS — R079 Chest pain, unspecified: Secondary | ICD-10-CM | POA: Insufficient documentation

## 2012-12-27 DIAGNOSIS — IMO0001 Reserved for inherently not codable concepts without codable children: Secondary | ICD-10-CM | POA: Insufficient documentation

## 2013-01-11 ENCOUNTER — Other Ambulatory Visit: Payer: Self-pay | Admitting: Family Medicine

## 2013-01-12 ENCOUNTER — Ambulatory Visit: Payer: BC Managed Care – PPO | Attending: Family Medicine | Admitting: Physical Therapy

## 2013-01-12 DIAGNOSIS — M546 Pain in thoracic spine: Secondary | ICD-10-CM | POA: Insufficient documentation

## 2013-01-12 DIAGNOSIS — R079 Chest pain, unspecified: Secondary | ICD-10-CM | POA: Insufficient documentation

## 2013-01-12 DIAGNOSIS — R293 Abnormal posture: Secondary | ICD-10-CM | POA: Insufficient documentation

## 2013-01-12 DIAGNOSIS — IMO0001 Reserved for inherently not codable concepts without codable children: Secondary | ICD-10-CM | POA: Insufficient documentation

## 2013-01-12 MED ORDER — CARISOPRODOL 350 MG PO TABS: 350.0000 mg | ORAL_TABLET | Freq: Three times a day (TID) | ORAL | Status: DC | PRN

## 2013-01-12 NOTE — Telephone Encounter (Signed)
?   OK to Refill  

## 2013-01-12 NOTE — Telephone Encounter (Signed)
Rx Refilled  

## 2013-01-12 NOTE — Telephone Encounter (Signed)
Ok to refill 

## 2013-01-19 ENCOUNTER — Ambulatory Visit: Payer: BC Managed Care – PPO | Admitting: Physical Therapy

## 2013-01-25 ENCOUNTER — Encounter: Payer: BC Managed Care – PPO | Admitting: Physical Therapy

## 2013-01-25 ENCOUNTER — Ambulatory Visit: Payer: BC Managed Care – PPO | Admitting: Physical Therapy

## 2013-02-16 ENCOUNTER — Ambulatory Visit: Payer: BC Managed Care – PPO | Admitting: Physician Assistant

## 2013-02-16 ENCOUNTER — Ambulatory Visit: Payer: BC Managed Care – PPO | Admitting: Family Medicine

## 2013-02-21 ENCOUNTER — Encounter: Payer: Self-pay | Admitting: Family Medicine

## 2013-02-21 ENCOUNTER — Ambulatory Visit (INDEPENDENT_AMBULATORY_CARE_PROVIDER_SITE_OTHER): Payer: BC Managed Care – PPO | Admitting: Family Medicine

## 2013-02-21 VITALS — BP 120/80 | HR 96 | Temp 98.0°F | Resp 18 | Wt 206.0 lb

## 2013-02-21 DIAGNOSIS — R319 Hematuria, unspecified: Secondary | ICD-10-CM

## 2013-02-21 LAB — URINALYSIS, ROUTINE W REFLEX MICROSCOPIC
Bilirubin Urine: NEGATIVE
Glucose, UA: NEGATIVE mg/dL
Protein, ur: NEGATIVE mg/dL
Specific Gravity, Urine: 1.025 (ref 1.005–1.030)
pH: 6 (ref 5.0–8.0)

## 2013-02-21 MED ORDER — HYDROCODONE-ACETAMINOPHEN 10-325 MG PO TABS: 1.0000 | ORAL_TABLET | Freq: Three times a day (TID) | ORAL | Status: DC | PRN

## 2013-02-21 MED ORDER — EFLORNITHINE HCL 13.9 % EX CREA: TOPICAL_CREAM | CUTANEOUS | Status: DC

## 2013-02-21 NOTE — Progress Notes (Signed)
Subjective:    Patient ID: Joan Valdez, female    DOB: January 07, 1971, 42 y.o.   MRN: [id number]  HPI One week ago the patient had an episode of hematuria. She was having some left flank pain at the time. She experienced dysuria at the moment when it happened. Since then she continues to have occasional left flank pain but she has not witnessed any further episodes of hematuria. She denies dysuria, frequency, urgency, or fevers. She denies nausea vomiting. Past Medical History  Diagnosis Date  . PCO (polycystic ovaries)   . Hypothyroid   . Allergic rhinitis   . Chronic headache   . Acid reflux   . Cervical dysplasia    Current Outpatient Prescriptions on File Prior to Visit  Medication Sig Dispense Refill  . Cetirizine HCl (ZYRTEC ALLERGY PO) Take by mouth.      . dexlansoprazole (DEXILANT) 60 MG capsule Take 60 mg by mouth daily.      . diphenhydrAMINE (BENADRYL) 25 MG tablet Take 50 mg by mouth every 6 (six) hours as needed. Sinus headache      . ibuprofen (ADVIL,MOTRIN) 200 MG tablet Take 400 mg by mouth every 8 (eight) hours as needed. For pain      . levothyroxine (SYNTHROID, LEVOTHROID) 100 MCG tablet Take 100 mcg by mouth daily.       Marland Kitchen liothyronine (CYTOMEL) 25 MCG tablet Take 12.5 mcg by mouth 2 (two) times daily.       Marland Kitchen MIRCETTE 0.15-0.02/0.01 MG (21/5) tablet Take 1 tablet by mouth daily.  28 tablet  15  . mometasone (NASONEX) 50 MCG/ACT nasal spray Place 2 sprays into the nose daily.      . ranitidine (ZANTAC) 300 MG tablet Take 300 mg by mouth at bedtime.      Marland Kitchen spironolactone (ALDACTONE) 50 MG tablet TAKE 1 TABLET BY MOUTH TWICE DAILY  60 tablet  10  . topiramate (TOPAMAX) 200 MG tablet Take 200 mg by mouth daily.       No current facility-administered medications on file prior to visit.   Allergies  Allergen Reactions  . Imitrex (Sumatriptan Base) Anaphylaxis  . Amoxicillin Hives  . Bactrim Hives  . Cephalexin Hives  . Erythromycin Hives  . Penicillins Hives  .  Strawberry Hives  . Sulfa Antibiotics Hives  . Zithromax (Azithromycin Dihydrate) Hives   History   Social History  . Marital Status: Married    Spouse Name: N/A    Number of Children: Y  . Years of Education: N/A   Occupational History  . teacher    Social History Main Topics  . Smoking status: Never Smoker   . Smokeless tobacco: Not on file  . Alcohol Use: 1.0 oz/week    2 drink(s) per week     Comment: socially.  . Drug Use: No  . Sexually Active: Yes    Birth Control/ Protection: Pill   Other Topics Concern  . Not on file   Social History Narrative  . No narrative on file      Review of Systems  All other systems reviewed and are negative.       Objective:   Physical Exam  Vitals reviewed. Cardiovascular: Normal rate, regular rhythm and normal heart sounds.   No murmur heard. Pulmonary/Chest: Effort normal and breath sounds normal. No respiratory distress. She has no wheezes. She has no rales.  Abdominal: Soft. Bowel sounds are normal. She exhibits no distension. There is no tenderness. There is no rebound.  Assessment & Plan:  1. Hematuria Urinalysis reveals no evidence of a urinary tract infection. I suspect she may have passed or is in the process of passing a small kidney stone. I gave her a prescription for Vicodin to use if needed for pain. I recommended she increase her fluid intake substantially. If hematuria returns or her pain worsen I would get a CT to evaluate further. - Urinalysis, Routine w reflex microscopic - HYDROcodone-acetaminophen (NORCO) 10-325 MG per tablet; Take 1 tablet by mouth every 8 (eight) hours as needed for pain.  Dispense: 30 tablet; Refill: 0

## 2013-03-10 ENCOUNTER — Other Ambulatory Visit: Payer: Self-pay | Admitting: Family Medicine

## 2013-03-28 ENCOUNTER — Encounter: Payer: Self-pay | Admitting: Family Medicine

## 2013-03-28 ENCOUNTER — Ambulatory Visit (INDEPENDENT_AMBULATORY_CARE_PROVIDER_SITE_OTHER): Payer: BC Managed Care – PPO | Admitting: Gynecology

## 2013-03-28 ENCOUNTER — Encounter: Payer: Self-pay | Admitting: Obstetrics and Gynecology

## 2013-03-28 ENCOUNTER — Encounter: Payer: Self-pay | Admitting: Gynecology

## 2013-03-28 ENCOUNTER — Ambulatory Visit (INDEPENDENT_AMBULATORY_CARE_PROVIDER_SITE_OTHER): Payer: BC Managed Care – PPO | Admitting: Family Medicine

## 2013-03-28 VITALS — BP 120/88 | HR 92 | Temp 98.4°F | Resp 18 | Wt 206.0 lb

## 2013-03-28 VITALS — BP 130/80 | Ht 63.0 in | Wt 206.0 lb

## 2013-03-28 DIAGNOSIS — M25572 Pain in left ankle and joints of left foot: Secondary | ICD-10-CM

## 2013-03-28 DIAGNOSIS — Z01419 Encounter for gynecological examination (general) (routine) without abnormal findings: Secondary | ICD-10-CM

## 2013-03-28 DIAGNOSIS — E282 Polycystic ovarian syndrome: Secondary | ICD-10-CM

## 2013-03-28 DIAGNOSIS — M25579 Pain in unspecified ankle and joints of unspecified foot: Secondary | ICD-10-CM

## 2013-03-28 MED ORDER — MIRCETTE 0.15-0.02/0.01 MG (21/5) PO TABS: 1.0000 | ORAL_TABLET | Freq: Every day | ORAL | Status: DC

## 2013-03-28 NOTE — Patient Instructions (Addendum)
Call to Schedule your mammogram this coming fall as you turn 42.  Facilities in [place]: 1)  [facility], [place] Phone: [phone] 2)  [facility], [address] [id number] Phone: [phone] 3)  Dr. Yolanda Bonine at [facility] [address] Phone: [phone]     Mammogram A mammogram is an X-ray test to find changes in a woman's breast. You should get a mammogram if:  You are 22 years of age or older  You have risk factors.   Your doctor recommends that you have one.  BEFORE THE TEST  Do not schedule the test the week before your period, especially if your breasts are sore during this time.  On the day of your mammogram:  Wash your breasts and armpits well. After washing, do not put on any deodorant or talcum powder on until after your test.   Eat and drink as you usually do.   Take your medicines as usual.   If you are diabetic and take insulin, make sure you:   Eat before coming for your test.   Take your insulin as usual.   If you cannot keep your appointment, call before the appointment to cancel. Schedule another appointment.  TEST  You will need to undress from the waist up. You will put on a hospital gown.   Your breast will be put on the mammogram machine, and it will press firmly on your breast with a piece of plastic called a compression paddle. This will make your breast flatter so that the machine can X-ray all parts of your breast.   Both breasts will be X-rayed. Each breast will be X-rayed from above and from the side. An X-ray might need to be taken again if the picture is not good enough.   The mammogram will last about 15 to 30 minutes.  AFTER THE TEST Finding out the results of your test Ask when your test results will be ready. Make sure you get your test results.  Document Released: 11/27/2008 Document Revised: 08/20/2011 Document Reviewed:  11/27/2008 [facility] Patient Information 2012 [place], [place].

## 2013-03-28 NOTE — Progress Notes (Signed)
Subjective:    Patient ID: Joan Valdez, female    DOB: April 02, 1971, 42 y.o.   MRN: [id number]  HPI Patient comes in with an interesting problem.  She has a painful area on the plantar aspect of her left foot.  It is in the middle of her arch there is a thickened area of skin.  Visibly the skin appears normal.  However the skin does feel thickened in that area.  The dermal ridges and lines are still present.  There are no capillary hemorrhages.  There is no evidence of foreign body or trauma.  This is NOT a plantar wart or corn.  The patient states that the area is tender to the touch. It has been there for 2 weeks. Past Medical History  Diagnosis Date  . PCO (polycystic ovaries)   . Hypothyroid   . Allergic rhinitis   . Chronic headache   . Acid reflux   . Cervical dysplasia    Current Outpatient Prescriptions on File Prior to Visit  Medication Sig Dispense Refill  . Cetirizine HCl (ZYRTEC ALLERGY PO) Take by mouth.      . dexlansoprazole (DEXILANT) 60 MG capsule Take 60 mg by mouth daily.      Marland Kitchen dihydroergotamine (MIGRANAL) 4 MG/ML nasal spray Place 1 spray into the nose as needed for migraine. Use in one nostril as directed.  No more than 4 sprays in one hour      . diphenhydrAMINE (BENADRYL) 25 MG tablet Take 50 mg by mouth every 6 (six) hours as needed. Sinus headache      . Eflornithine HCl (VANIQA) 13.9 % cream Apply pea-sized amount as directed  30 g  5  . ibuprofen (ADVIL,MOTRIN) 200 MG tablet Take 400 mg by mouth every 8 (eight) hours as needed. For pain      . levothyroxine (SYNTHROID, LEVOTHROID) 100 MCG tablet Take 100 mcg by mouth daily.       Marland Kitchen liothyronine (CYTOMEL) 25 MCG tablet Take 12.5 mcg by mouth 2 (two) times daily.       Marland Kitchen MIRCETTE 0.15-0.02/0.01 MG (21/5) tablet Take 1 tablet by mouth daily.  28 tablet  12  . mometasone (NASONEX) 50 MCG/ACT nasal spray Place 2 sprays into the nose daily.      . ranitidine (ZANTAC) 300 MG tablet Take 300 mg by mouth at bedtime.       . topiramate (TOPAMAX) 200 MG tablet Take 200 mg by mouth daily.       No current facility-administered medications on file prior to visit.   Allergies  Allergen Reactions  . Imitrex (Sumatriptan Base) Anaphylaxis  . Amoxicillin Hives  . Bactrim Hives  . Cephalexin Hives  . Erythromycin Hives  . Penicillins Hives  . Strawberry Hives  . Sulfa Antibiotics Hives  . Zithromax (Azithromycin Dihydrate) Hives   History   Social History  . Marital Status: Married    Spouse Name: N/A    Number of Children: Y  . Years of Education: N/A   Occupational History  . teacher    Social History Main Topics  . Smoking status: Never Smoker   . Smokeless tobacco: Not on file  . Alcohol Use: 1.0 oz/week    2 drink(s) per week     Comment: socially.  . Drug Use: No  . Sexually Active: Yes    Birth Control/ Protection: Pill   Other Topics Concern  . Not on file   Social History Narrative  . No narrative on file  Review of Systems  All other systems reviewed and are negative.       Objective:   Physical Exam  Vitals reviewed. Cardiovascular: Normal rate and regular rhythm.   Pulmonary/Chest: Effort normal and breath sounds normal.  Skin: Skin is warm. No rash noted. No erythema. No pallor.   1 cm area of thickened tender skin on the plantar aspect of her left foot in the middle of her arch.        Assessment & Plan:  1. Pain in joint, ankle and foot, left I am not sure what this is.  I believe it is likely a type of callus due to mechanical friction rather than a plantar wart.  I recommended flector patch.  I gave her a sample of 2 patches.  She is to cut the patch into small pieces they will only cover the tender area. She is to wear the patch for 12 hours and then replace. These patches should be enough for one week of therapy. If the pain and irritation goes away no further treatment is necessary. If the pain persists I would get an x-ray to rule out foreign body  versus podiatry consult.

## 2013-03-28 NOTE — Progress Notes (Signed)
Joan Valdez 09-17-1970 [id number]        42 y.o.  G1P1001 for annual exam.  Former patient of Dr. Eda Paschal. Doing well without complaints. Several issues noted below.  Past medical history,surgical history, medications, allergies, family history and social history were all reviewed and documented in the EPIC chart.  ROS:  Performed and pertinent positives and negatives are included in the history, assessment and plan .  Exam: Kim assistant Filed Vitals:   03/28/13 0934  BP: 130/80  Height: 5\' 3"  (1.6 m)  Weight: 206 lb (93.441 kg)   General appearance  Normal Skin grossly normal Head/Neck normal with no cervical or supraclavicular adenopathy thyroid normal Lungs  clear Cardiac RR, without RMG Abdominal  soft, nontender, without masses, organomegaly or hernia Breasts  examined lying and sitting without masses, retractions, discharge or axillary adenopathy. Pelvic  Ext/BUS/vagina  normal   Cervix  normal   Uterus  anteverted, normal size, shape and contour, midline and mobile nontender   Adnexa  Without masses or tenderness    Anus and perineum  normal   Rectovaginal  normal sphincter tone without palpated masses or tenderness.    Assessment/Plan:  42 y.o. G7P1001 female for annual exam.   1. PCOS. On Mircette BCPs doing well. Discussed possible increased risk of thrombosis such as stroke heart attack DVT with drospirenone containing products. Patient understands accepts and was to continue. 2. Hirsutism. Had spironolactone increased to 150 mg daily. Has not really noticed any difference. Recommended she go ahead and stop this for now and we'll see how she does. No significant overt hirsutism noted.  3. Pap smear 2012. History of LEEP 2002 with normal Pap smears afterwards. No Pap smear done today. Plan repeat next year at three-year interval. 4. Mammography. Recommended mammogram this coming fall as she turns 42. SBE monthly reviewed. 5. Health maintenance. Patient has all  of her blood work done through her primary physician's office. Followup in one year, sooner as needed.   Note: This document was prepared with digital dictation and possible smart phrase technology. Any transcriptional errors that result from this process are unintentional.   Dara Lords MD, 10:03 AM 03/28/2013

## 2013-04-11 ENCOUNTER — Other Ambulatory Visit: Payer: Self-pay | Admitting: Obstetrics and Gynecology

## 2013-05-20 ENCOUNTER — Other Ambulatory Visit: Payer: Self-pay | Admitting: Family Medicine

## 2013-05-26 ENCOUNTER — Telehealth: Payer: Self-pay | Admitting: Family Medicine

## 2013-05-26 DIAGNOSIS — G43909 Migraine, unspecified, not intractable, without status migrainosus: Secondary | ICD-10-CM

## 2013-05-26 MED ORDER — TOPIRAMATE 100 MG PO TABS: ORAL_TABLET | ORAL | Status: DC

## 2013-05-26 NOTE — Telephone Encounter (Signed)
Rx Refilled  

## 2013-05-26 NOTE — Telephone Encounter (Signed)
Topamax 100 mg 2 QHS #60

## 2013-06-28 ENCOUNTER — Ambulatory Visit (INDEPENDENT_AMBULATORY_CARE_PROVIDER_SITE_OTHER): Payer: BC Managed Care – PPO | Admitting: Family Medicine

## 2013-06-28 DIAGNOSIS — Z23 Encounter for immunization: Secondary | ICD-10-CM

## 2013-07-03 ENCOUNTER — Other Ambulatory Visit: Payer: Self-pay

## 2013-07-03 DIAGNOSIS — Z1231 Encounter for screening mammogram for malignant neoplasm of breast: Secondary | ICD-10-CM

## 2013-07-19 ENCOUNTER — Ambulatory Visit
Admission: RE | Admit: 2013-07-19 | Discharge: 2013-07-19 | Disposition: A | Discharge status: Home / Self Care | Payer: BC Managed Care – PPO | Source: Ambulatory Visit

## 2013-07-19 DIAGNOSIS — Z1231 Encounter for screening mammogram for malignant neoplasm of breast: Secondary | ICD-10-CM

## 2013-07-24 ENCOUNTER — Ambulatory Visit (INDEPENDENT_AMBULATORY_CARE_PROVIDER_SITE_OTHER): Payer: BC Managed Care – PPO | Admitting: Family Medicine

## 2013-07-24 ENCOUNTER — Encounter: Payer: Self-pay | Admitting: Family Medicine

## 2013-07-24 VITALS — BP 120/80 | HR 78 | Temp 99.4°F | Resp 18 | Ht 62.0 in | Wt 217.0 lb

## 2013-07-24 DIAGNOSIS — J069 Acute upper respiratory infection, unspecified: Secondary | ICD-10-CM | POA: Insufficient documentation

## 2013-07-24 MED ORDER — LEVOFLOXACIN 500 MG PO TABS: 500.0000 mg | ORAL_TABLET | Freq: Every day | ORAL | Status: DC

## 2013-07-24 MED ORDER — HYDROCOD POLST-CHLORPHEN POLST 10-8 MG/5ML PO LQCR: 5.0000 mL | Freq: Two times a day (BID) | ORAL | Status: DC | PRN

## 2013-07-24 NOTE — Progress Notes (Signed)
  Subjective:    Patient ID: Joan Valdez, female    DOB: [birth year], 42 y.o.   MRN: [id number]  HPI Pt here with cough with minimal production, low grade fever, sorethroat, body aches for past 5 days. OTC meds taken for cough with minimal improvement. Works as a early Journalist, newspaper.  Taking her regular allergy medications including nasonex. Feels her symptoms are worsening.     Review of Systems  GEN- denies fatigue, fever, weight loss,weakness, recent illness HEENT- denies eye drainage, change in vision,+ nasal discharge, CVS- denies chest pain, palpitations RESP- denies SOB,+ cough, wheeze ABD- denies N/V, change in stools, abd pain Neuro- denies headache, dizziness, syncope, seizure activity      Objective:   Physical Exam GEN- NAD, alert and oriented x3 HEENT- PERRL, EOMI, non injected sclera, pink conjunctiva, MMM, oropharynx mild injection, TM clear bilat no effusion, no maxillary sinus tenderness, + Nasal drainage  Neck- Supple, no LAD CVS- RRR, no murmur RESP-CTAB, upper airway congestion EXT- No edema Pulses- Radial 2+         Assessment & Plan:

## 2013-07-24 NOTE — Patient Instructions (Signed)
Continue flonase Mucinex during the day Cough medication at bedtime Levaquin , start if not improved by Wed or Thursday F/u as needed

## 2013-07-24 NOTE — Assessment & Plan Note (Signed)
Discussed likley viral etiology , she has been progressively worsening, advised not to start antibiotic unless not improved by Thurs/Friday Cough medication given

## 2013-07-31 ENCOUNTER — Other Ambulatory Visit: Payer: Self-pay | Admitting: Gynecology

## 2013-07-31 DIAGNOSIS — R928 Other abnormal and inconclusive findings on diagnostic imaging of breast: Secondary | ICD-10-CM

## 2013-08-01 ENCOUNTER — Telehealth: Payer: Self-pay | Admitting: Family Medicine

## 2013-08-01 NOTE — Telephone Encounter (Signed)
LMTRC

## 2013-08-01 NOTE — Telephone Encounter (Signed)
Please call and triage her. She is on a very broad spectrum antibiotic, therefore I would not change this. Cough can linger for a couple of weeks Make sure she is taking mucniex as well.

## 2013-08-01 NOTE — Telephone Encounter (Signed)
Patient was seen on Monday for a URI and the medicine is not helping . She was prescribe an antibiotic and cough medicine  Is not seem to be doing anything.    [facility]  [address]  [place]

## 2013-08-02 MED ORDER — METHYLPREDNISOLONE 4 MG PO KIT: PACK | ORAL | Status: DC

## 2013-08-02 MED ORDER — HYDROCOD POLST-CHLORPHEN POLST 10-8 MG/5ML PO LQCR: 5.0000 mL | Freq: Two times a day (BID) | ORAL | Status: DC | PRN

## 2013-08-02 NOTE — Telephone Encounter (Signed)
Patient office with her daughter today. Still has a lot of congestion and pain in her sinus region she also still has some cough. She is out of her cough medication which was helping.Addl Medrol Dosepak for inflammation which will help the sinuses, cough medicine was refilled

## 2013-08-02 NOTE — Telephone Encounter (Signed)
Pt aware, in office and still congested feels like how she did when she came in for her appt.

## 2013-08-14 HISTORY — PX: PORTACATH PLACEMENT: SHX2246

## 2013-08-16 ENCOUNTER — Ambulatory Visit
Admission: RE | Admit: 2013-08-16 | Discharge: 2013-08-16 | Disposition: A | Discharge status: Home / Self Care | Payer: BC Managed Care – PPO | Source: Ambulatory Visit | Attending: Gynecology | Admitting: Gynecology

## 2013-08-16 ENCOUNTER — Other Ambulatory Visit: Payer: Self-pay | Admitting: Gynecology

## 2013-08-16 ENCOUNTER — Telehealth: Payer: Self-pay | Admitting: *Deleted

## 2013-08-16 DIAGNOSIS — R928 Other abnormal and inconclusive findings on diagnostic imaging of breast: Secondary | ICD-10-CM

## 2013-08-16 DIAGNOSIS — C50919 Malignant neoplasm of unspecified site of unspecified female breast: Secondary | ICD-10-CM

## 2013-08-16 DIAGNOSIS — N63 Unspecified lump in unspecified breast: Secondary | ICD-10-CM

## 2013-08-16 HISTORY — DX: Malignant neoplasm of unspecified site of unspecified female breast: C50.919

## 2013-08-16 NOTE — Telephone Encounter (Signed)
Dr.Eagle called from breast center to inform you the breast result for this patient see report for today.

## 2013-08-17 ENCOUNTER — Ambulatory Visit
Admission: RE | Admit: 2013-08-17 | Discharge: 2013-08-17 | Disposition: A | Discharge status: Home / Self Care | Payer: BC Managed Care – PPO | Source: Ambulatory Visit | Attending: Gynecology | Admitting: Gynecology

## 2013-08-17 ENCOUNTER — Other Ambulatory Visit: Payer: Self-pay | Admitting: Gynecology

## 2013-08-17 DIAGNOSIS — D0510 Intraductal carcinoma in situ of unspecified breast: Secondary | ICD-10-CM

## 2013-08-17 DIAGNOSIS — N63 Unspecified lump in unspecified breast: Secondary | ICD-10-CM

## 2013-08-18 ENCOUNTER — Telehealth: Payer: Self-pay

## 2013-08-18 ENCOUNTER — Encounter: Payer: Self-pay | Admitting: Family Medicine

## 2013-08-18 NOTE — Telephone Encounter (Signed)
I spoke with [place] at [facility] that handle Prior Auth for [employer].  Auth # was provided for Bilat Br MRI with and w/o contrast. Auth# is [id number] and is good from today until Sep 16 2013. Note added to appt line.

## 2013-08-24 ENCOUNTER — Telehealth (INDEPENDENT_AMBULATORY_CARE_PROVIDER_SITE_OTHER): Payer: Self-pay | Admitting: General Surgery

## 2013-08-24 ENCOUNTER — Encounter (INDEPENDENT_AMBULATORY_CARE_PROVIDER_SITE_OTHER): Payer: Self-pay | Admitting: General Surgery

## 2013-08-24 ENCOUNTER — Ambulatory Visit (INDEPENDENT_AMBULATORY_CARE_PROVIDER_SITE_OTHER): Payer: BC Managed Care – PPO | Admitting: General Surgery

## 2013-08-24 VITALS — BP 135/84 | HR 71 | Temp 98.0°F | Resp 16 | Ht 62.0 in | Wt 214.6 lb

## 2013-08-24 DIAGNOSIS — C50519 Malignant neoplasm of lower-outer quadrant of unspecified female breast: Secondary | ICD-10-CM

## 2013-08-24 DIAGNOSIS — C50511 Malignant neoplasm of lower-outer quadrant of right female breast: Secondary | ICD-10-CM

## 2013-08-24 NOTE — Telephone Encounter (Signed)
Patient met with surgery scheduling, went over financial responsibilities, patient will call back

## 2013-08-24 NOTE — Progress Notes (Signed)
Patient ID: Joan Valdez, female   DOB: 12/30/1970, 42 y.o.   MRN: [id number]  No chief complaint on file.   HPI Joan Valdez is a 42 y.o. female.  We are asked to see the patient in consultation by Dr. Eagle to evaluate her for right breast cancer. The patient is a 42-year-old white female who recently went for her first routine screening mammogram. She has not been doing regular self checks. A large mass was seen in the 6:00 position of the right breast. It measured approximately 3 cm for the primary mass with another 2 cm of calcification coming off anteriorly. She also had an abnormal lymph node in the right axilla that was biopsied. Both of these came back as breast cancer. She is scheduled for her MRI tomorrow. Her main complaint is that she has had a cough for the last 2 years that is gradually getting worse. She has never been evaluated with chest x-ray. HPI  Past Medical History  Diagnosis Date  . PCO (polycystic ovaries)   . Hypothyroid   . Allergic rhinitis   . Chronic headache   . Acid reflux   . Cervical dysplasia     Past Surgical History  Procedure Laterality Date  . Cesarean section  2001  . Colposcopy    . Cervical biopsy  w/ loop electrode excision  2002    Family History  Problem Relation Age of Onset  . Hypertension Mother   . Migraines Mother   . Diabetes Paternal Grandmother   . Hypertension Paternal Grandmother   . Rheum arthritis Paternal Grandmother   . Heart disease Maternal Grandmother     Social History History  Substance Use Topics  . Smoking status: Never Smoker   . Smokeless tobacco: Not on file  . Alcohol Use: 1.0 oz/week    2 drink(s) per week     Comment: socially.    Allergies  Allergen Reactions  . Imitrex [Sumatriptan Base] Anaphylaxis  . Amoxicillin Hives  . Bactrim Hives  . Cephalexin Hives  . Erythromycin Hives  . Penicillins Hives  . Strawberry Hives  . Sulfa Antibiotics Hives  . Zithromax [Azithromycin Dihydrate]  Hives    Current Outpatient Prescriptions  Medication Sig Dispense Refill  . Cetirizine HCl (ZYRTEC ALLERGY PO) Take by mouth.      . chlorpheniramine-HYDROcodone (TUSSIONEX PENNKINETIC ER) 10-8 MG/5ML LQCR Take 5 mLs by mouth every 12 (twelve) hours as needed for cough.  180 mL  0  . dexlansoprazole (DEXILANT) 60 MG capsule Take 60 mg by mouth daily.      . dihydroergotamine (MIGRANAL) 4 MG/ML nasal spray Place 1 spray into the nose as needed for migraine. Use in one nostril as directed.  No more than 4 sprays in one hour      . diphenhydrAMINE (BENADRYL) 25 MG tablet Take 50 mg by mouth every 6 (six) hours as needed. Sinus headache      . Eflornithine HCl (VANIQA) 13.9 % cream Apply pea-sized amount as directed  30 g  5  . ibuprofen (ADVIL,MOTRIN) 200 MG tablet Take 400 mg by mouth every 8 (eight) hours as needed. For pain      . levofloxacin (LEVAQUIN) 500 MG tablet Take 1 tablet (500 mg total) by mouth daily.  7 tablet  0  . levothyroxine (SYNTHROID, LEVOTHROID) 100 MCG tablet Take 100 mcg by mouth daily.       . liothyronine (CYTOMEL) 25 MCG tablet Take 12.5 mcg by mouth 2 (two) times daily.       .   methylPREDNISolone (MEDROL DOSEPAK) 4 MG tablet follow package directions  21 tablet  0  . MIRCETTE 0.15-0.02/0.01 MG (21/5) tablet TAKE 1 TABLET BY MOUTH DAILY.  28 tablet  12  . mometasone (NASONEX) 50 MCG/ACT nasal spray Place 2 sprays into the nose daily.      . ranitidine (ZANTAC) 300 MG tablet Take 300 mg by mouth at bedtime.      . topiramate (TOPAMAX) 100 MG tablet TAKE 2 TABLETS BY MOUTH AT BEDTIME  60 tablet  4   No current facility-administered medications for this visit.    Review of Systems Review of Systems  Constitutional: Negative.   HENT: Negative.   Eyes: Negative.   Respiratory: Positive for cough.   Cardiovascular: Negative.   Gastrointestinal: Negative.   Endocrine: Negative.   Genitourinary: Negative.   Musculoskeletal: Negative.   Skin: Negative.    Allergic/Immunologic: Negative.   Neurological: Negative.   Hematological: Negative.   Psychiatric/Behavioral: Negative.     Blood pressure 135/84, pulse 71, temperature 98 F (36.7 C), temperature source Temporal, resp. rate 16, height 5' 2" (1.575 m), weight 214 lb 9.6 oz (97.342 kg).  Physical Exam Physical Exam  Constitutional: She is oriented to person, place, and time. She appears well-developed and well-nourished.  HENT:  Head: Normocephalic and atraumatic.  Eyes: Conjunctivae and EOM are normal. Pupils are equal, round, and reactive to light.  Neck: Normal range of motion. Neck supple.  Cardiovascular: Normal rate, regular rhythm and normal heart sounds.   Pulmonary/Chest: Effort normal and breath sounds normal.  There is a large mass in the 6:00 position of the right breast measuring about 4 cm across. It seems mobile and not tethered to the skin or the chest wall. There is no palpable mass in the left breast. There is no palpable axillary, supraclavicular, or cervical lymphadenopathy although she does have a positive right axillary lymph node by biopsy  Abdominal: Soft. Bowel sounds are normal.  Musculoskeletal: Normal range of motion.  Lymphadenopathy:    She has no cervical adenopathy.  Neurological: She is alert and oriented to person, place, and time.  Skin: Skin is warm and dry.  Psychiatric: She has a normal mood and affect. Her behavior is normal.    Data Reviewed As above  Assessment    The patient appears to have a locally advanced right breast cancer. I have discussed with her in detail the different options for surgical treatment. Because of the size of the tumor I think the most appropriate choice for her would be a mastectomy if she were to be operated on now. I think she may benefit from neoadjuvant treatment with chemotherapy. If she chooses this without she will probably need to have a Port-A-Cath placed. I've discussed with her in detail the risks and  benefits of placing a Port-A-Cath as well as some of the technical aspects and she understands and wishes to proceed     Plan    Plan for referral to medical oncology to consider neoadjuvant therapy and also plan for Port-A-Cath placement        TOTH III,Tallyn Holroyd S 08/24/2013, 4:36 PM    

## 2013-08-24 NOTE — Patient Instructions (Signed)
Plan for port and referral to medical oncology

## 2013-08-25 ENCOUNTER — Encounter: Payer: Self-pay | Admitting: *Deleted

## 2013-08-25 ENCOUNTER — Telehealth: Payer: Self-pay | Admitting: *Deleted

## 2013-08-25 ENCOUNTER — Encounter (HOSPITAL_COMMUNITY): Payer: Self-pay | Admitting: Pharmacy Technician

## 2013-08-25 ENCOUNTER — Ambulatory Visit
Admission: RE | Admit: 2013-08-25 | Discharge: 2013-08-25 | Disposition: A | Discharge status: Home / Self Care | Payer: BC Managed Care – PPO | Source: Ambulatory Visit | Attending: Gynecology | Admitting: Gynecology

## 2013-08-25 DIAGNOSIS — D0510 Intraductal carcinoma in situ of unspecified breast: Secondary | ICD-10-CM

## 2013-08-25 MED ORDER — GADOBENATE DIMEGLUMINE 529 MG/ML IV SOLN
20.0000 mL | Freq: Once | INTRAVENOUS | Status: AC | PRN
  Administered 2013-08-25: 20 mL via INTRAVENOUS

## 2013-08-25 NOTE — Telephone Encounter (Signed)
Received an appt back from Dr. Welton Flakes and call and confirmed 08/29/13 appt w/ pt.  Mailed before appt letter & packet to pt.  Emailed Music therapist at [facility] to make her aware.  Took paperwork to Med [facility] for chart.

## 2013-08-25 NOTE — Progress Notes (Signed)
Received referral from the [facility] and I have sent paperwork to Dr. Welton Flakes to see if she can see this pt or if I need to give to Dr. Darnelle Catalan.  I emailed Michelle at [facility] to make her aware.

## 2013-08-28 ENCOUNTER — Encounter: Payer: Self-pay | Admitting: *Deleted

## 2013-08-28 ENCOUNTER — Other Ambulatory Visit: Payer: Self-pay | Admitting: *Deleted

## 2013-08-28 ENCOUNTER — Other Ambulatory Visit: Payer: Self-pay | Admitting: Gynecology

## 2013-08-28 DIAGNOSIS — C50511 Malignant neoplasm of lower-outer quadrant of right female breast: Secondary | ICD-10-CM

## 2013-08-28 DIAGNOSIS — R928 Other abnormal and inconclusive findings on diagnostic imaging of breast: Secondary | ICD-10-CM

## 2013-08-28 NOTE — Progress Notes (Signed)
Completed chart and gave to [facility] to enter labs and return to me or place in Dr. Milta Deiters box.

## 2013-08-28 NOTE — Progress Notes (Signed)
Received chart back from Dawn and I placed it in Dr. Khan's box. 

## 2013-08-29 ENCOUNTER — Telehealth: Payer: Self-pay | Admitting: *Deleted

## 2013-08-29 ENCOUNTER — Other Ambulatory Visit (HOSPITAL_BASED_OUTPATIENT_CLINIC_OR_DEPARTMENT_OTHER): Payer: BC Managed Care – PPO

## 2013-08-29 ENCOUNTER — Telehealth: Payer: Self-pay | Admitting: Oncology

## 2013-08-29 ENCOUNTER — Encounter: Payer: Self-pay | Admitting: Oncology

## 2013-08-29 ENCOUNTER — Ambulatory Visit: Payer: BC Managed Care – PPO

## 2013-08-29 ENCOUNTER — Ambulatory Visit (HOSPITAL_BASED_OUTPATIENT_CLINIC_OR_DEPARTMENT_OTHER): Payer: BC Managed Care – PPO | Admitting: Oncology

## 2013-08-29 VITALS — BP 151/87 | HR 104 | Temp 98.6°F | Resp 20 | Ht 62.0 in | Wt 213.6 lb

## 2013-08-29 DIAGNOSIS — C50519 Malignant neoplasm of lower-outer quadrant of unspecified female breast: Secondary | ICD-10-CM

## 2013-08-29 DIAGNOSIS — C50511 Malignant neoplasm of lower-outer quadrant of right female breast: Secondary | ICD-10-CM

## 2013-08-29 DIAGNOSIS — C773 Secondary and unspecified malignant neoplasm of axilla and upper limb lymph nodes: Secondary | ICD-10-CM

## 2013-08-29 DIAGNOSIS — Z170 Estrogen receptor positive status [ER+]: Secondary | ICD-10-CM

## 2013-08-29 LAB — CBC WITH DIFFERENTIAL/PLATELET
BASO%: 0.7 % (ref 0.0–2.0)
Basophils Absolute: 0.1 10*3/uL (ref 0.0–0.1)
EOS%: 1.5 % (ref 0.0–7.0)
HGB: 15.5 g/dL (ref 11.6–15.9)
LYMPH%: 28.3 % (ref 14.0–49.7)
MCH: 30.4 pg (ref 25.1–34.0)
MCV: 88.4 fL (ref 79.5–101.0)
MONO%: 6.7 % (ref 0.0–14.0)
Platelets: 219 10*3/uL (ref 145–400)
RBC: 5.09 10*6/uL (ref 3.70–5.45)
RDW: 13.4 % (ref 11.2–14.5)
lymph#: 2.4 10*3/uL (ref 0.9–3.3)

## 2013-08-29 LAB — COMPREHENSIVE METABOLIC PANEL (CC13)
ALT: 12 U/L (ref 0–55)
AST: 11 U/L (ref 5–34)
Albumin: 3.4 g/dL — ABNORMAL LOW (ref 3.5–5.0)
Alkaline Phosphatase: 90 U/L (ref 40–150)
BUN: 10 mg/dL (ref 7.0–26.0)
Glucose: 125 mg/dl (ref 70–140)
Potassium: 3.9 mEq/L (ref 3.5–5.1)
Sodium: 139 mEq/L (ref 136–145)
Total Bilirubin: 0.5 mg/dL (ref 0.20–1.20)

## 2013-08-29 MED ORDER — LORAZEPAM 0.5 MG PO TABS: 0.5000 mg | ORAL_TABLET | Freq: Four times a day (QID) | ORAL | Status: DC | PRN

## 2013-08-29 MED ORDER — ONDANSETRON HCL 8 MG PO TABS: 8.0000 mg | ORAL_TABLET | Freq: Two times a day (BID) | ORAL | Status: DC

## 2013-08-29 MED ORDER — LIDOCAINE-PRILOCAINE 2.5-2.5 % EX CREA: TOPICAL_CREAM | CUTANEOUS | Status: DC | PRN

## 2013-08-29 MED ORDER — DEXAMETHASONE 4 MG PO TABS: 8.0000 mg | ORAL_TABLET | Freq: Two times a day (BID) | ORAL | Status: DC

## 2013-08-29 MED ORDER — PROCHLORPERAZINE MALEATE 10 MG PO TABS: 10.0000 mg | ORAL_TABLET | Freq: Four times a day (QID) | ORAL | Status: DC | PRN

## 2013-08-29 NOTE — Telephone Encounter (Signed)
Per staff message and POF I have scheduled appts.  JMW  

## 2013-08-29 NOTE — Pre-Procedure Instructions (Addendum)
Joan Valdez  08/29/2013   Your procedure is scheduled on: Monday, 09/04/13 @ 3:00 PM  Report to [facility] cone short stay admitting at 1:00 PM.  Call this number if you have problems the morning of surgery: [phone]   Remember:   Do not eat food or drink liquids after midnight.   Take these medicines the morning of surgery with A SIP OF WATER: dexlansoprazole (DEXILANT) 60 MG capsule, levothyroxine (SYNTHROID, LEVOTHROID) 100 MCG tablet,  liothyronine (CYTOMEL) 25 MCG tablet Stop taking Aspirin, vitamins and herbal medications. Do not take any NSAIDs ie: Ibuprofen, Advil, Naproxen or any medication containing Aspirin.   Do not wear jewelry, make-up or nail polish.  Do not wear lotions, powders, or perfumes. You may wear deodorant.  Do not shave 48 hours prior to surgery.   Do not bring valuables to the hospital.  [facility] is not responsible  for any belongings or valuables.               Contacts, dentures or bridgework may not be worn into surgery.  Leave suitcase in the car. After surgery it may be brought to your room.  For patients admitted to the hospital, discharge time is determined by your treatment team.               Patients discharged the day of surgery will not be allowed to drive  home.  Name and phone number of your driver:   Special Instructions: Shower using CHG 2 nights before surgery and the night before surgery.  If you shower the day of surgery use CHG.  Use special wash - you have one bottle of CHG for all showers.  You should use approximately 1/3 of the bottle for each shower.   Please read over the following fact sheets that you were given: Pain Booklet, Coughing and Deep Breathing and Surgical Site Infection Prevention

## 2013-08-29 NOTE — Telephone Encounter (Signed)
Pt returned my call and I confirmed 11/16/13 genetic appt w/ pt.

## 2013-08-29 NOTE — Progress Notes (Signed)
Checked in new pt with no financial concerns. °

## 2013-08-29 NOTE — Telephone Encounter (Signed)
gv and printed appt sched and avs for pt for Dec and Jan..Marland KitchenMarland KitchenClydie Braun will call pt with d.t for appt///LVM for Dr. Bensimhon///

## 2013-08-29 NOTE — Telephone Encounter (Signed)
Received a request from [facility] from Dr. Welton Flakes requesting a genetic appt.  Called and left a message for pt to return my call so I can schedule a genetic appt.

## 2013-08-30 ENCOUNTER — Encounter (HOSPITAL_COMMUNITY)
Admission: RE | Admit: 2013-08-30 | Discharge: 2013-08-30 | Disposition: A | Discharge status: Home / Self Care | Payer: BC Managed Care – PPO | Source: Ambulatory Visit | Attending: General Surgery | Admitting: General Surgery

## 2013-08-30 ENCOUNTER — Encounter (HOSPITAL_COMMUNITY): Payer: Self-pay

## 2013-08-30 DIAGNOSIS — Z01812 Encounter for preprocedural laboratory examination: Secondary | ICD-10-CM | POA: Insufficient documentation

## 2013-08-30 HISTORY — DX: Malignant (primary) neoplasm, unspecified: C80.1

## 2013-08-30 LAB — BASIC METABOLIC PANEL
CO2: 21 mEq/L (ref 19–32)
Calcium: 9.3 mg/dL (ref 8.4–10.5)
GFR calc non Af Amer: >90 mL/min (ref >90)
Glucose, Bld: 95 mg/dL (ref 70–99)
Potassium: 4 mEq/L (ref 3.5–5.1)
Sodium: 138 mEq/L (ref 135–145)

## 2013-08-30 LAB — CBC
HCT: 44.1 % (ref 36.0–46.0)
MCH: 31.2 pg (ref 26.0–34.0)
Platelets: 202 10*3/uL (ref 150–400)
RBC: 5.06 MIL/uL (ref 3.87–5.11)
WBC: 8.9 10*3/uL (ref 4.0–10.5)

## 2013-09-01 ENCOUNTER — Telehealth: Payer: Self-pay | Admitting: *Deleted

## 2013-09-01 ENCOUNTER — Inpatient Hospital Stay: Admission: RE | Admit: 2013-09-01 | Payer: BC Managed Care – PPO | Source: Ambulatory Visit

## 2013-09-01 ENCOUNTER — Encounter: Payer: Self-pay | Admitting: *Deleted

## 2013-09-01 NOTE — Telephone Encounter (Signed)
Left vm for pt to return call regarding care plan and f/u from Dr. Welton Flakes visit.

## 2013-09-01 NOTE — Progress Notes (Signed)
Faxed Care Plan to Leigh at [facility].  Took Care Plan to [facility] to scan.  

## 2013-09-01 NOTE — Telephone Encounter (Signed)
Spoke to pt and had f/u of Dr. Welton Flakes appt.  Pt denies questions or concerns regarding dx or treatment care plan.  Pt did ask about Dr. Welton Flakes placing her on Zoladex.  Informed pt it is currently being pre-authorized and we will schedule her soon.  Gave pt education on injection.  Pt denies further needs or concern.  Encourage pt to call with questions.  Received verbal understanding.  Contact information given.

## 2013-09-01 NOTE — Progress Notes (Signed)
Joan Valdez [id number] 03/24/1971 42 y.o. 09/01/2013 3:21 AM  CC  Leo Grosser, MD [address] [place] [address] Dr. Renae Fickle toth  REASON FOR CONSULTATION:  42 year old female with new diagnosis of triple positive invasive ductal carcinoma of the right breast diagnosed 08/16/2013  STAGE:   T2 N1 IDC, triple positive Right breast  REFERRING PHYSICIAN: Dr. Chevis Pretty  HISTORY OF PRESENT ILLNESS:  Joan Valdez is a 42 y.o. female.  Would medical history significant for hypothyroidism gastroesophageal reflux disease and migraine headaches.patient had a routine screening mammogram performed she was noted to have a large mass in the 6:00 position of the right breast measuring 3 cm for the primary mass with another 2 cm of calcifications anteriorly. She was also noted to have a enlarged lymph node in the right axilla. Patient underwent a needle core biopsy of the primary mass as well as axilla. The tumor was ER positive PR positive HER-2 positive with a proliferation marker Ki-67 59%. Patient had MRI of the breasts performed MRI showed right mass to measure 8.2 x 5.6 x 3.1 cm lymph node measured 2.5 cm on the left breast there was a 9 millimeter area of concern. Patient has been seen by Dr. Chevis Pretty who has recommended neoadjuvant treatment. Therefore she is being seen in medical oncology for further workup   Past Medical History: Past Medical History  Diagnosis Date  . PCO (polycystic ovaries)   . Hypothyroid   . Allergic rhinitis   . Chronic headache   . Acid reflux   . Cervical dysplasia   . Stone, kidney     Hx: of  . Cancer     ;Breast cancer; pre-cancerous cervical cells    Past Surgical History: Past Surgical History  Procedure Laterality Date  . Cesarean section  2001  . Colposcopy    . Cervical biopsy  w/ loop electrode excision  2002  . Eye surgery      Hx: of metal fragment removed    Family History: Family History  Problem Relation  Age of Onset  . Hypertension Mother   . Migraines Mother   . Diabetes Paternal Grandmother   . Hypertension Paternal Grandmother   . Rheum arthritis Paternal Grandmother   . Heart disease Maternal Grandmother     Social History History  Substance Use Topics  . Smoking status: Never Smoker   . Smokeless tobacco: Never Used  . Alcohol Use: 1.0 oz/week    2 drink(s) per week     Comment: socially.    Allergies: Allergies  Allergen Reactions  . Imitrex [Sumatriptan Base] Anaphylaxis  . Amoxicillin Hives  . Bactrim Hives  . Cephalexin Hives  . Erythromycin Hives  . Penicillins Hives  . Strawberry Hives  . Sulfa Antibiotics Hives  . Zithromax [Azithromycin Dihydrate] Hives    Current Medications: Current Outpatient Prescriptions  Medication Sig Dispense Refill  . cetirizine (ZYRTEC) 10 MG tablet Take 10 mg by mouth at bedtime.      . chlorpheniramine-HYDROcodone (TUSSIONEX) 10-8 MG/5ML LQCR       . desogestrel-ethinyl estradiol (MIRCETTE) 0.15-0.02/0.01 MG (21/5) tablet Take 1 tablet by mouth daily.      Marland Kitchen dexamethasone (DECADRON) 4 MG tablet Take 2 tablets (8 mg total) by mouth 2 (two) times daily with a meal. Take two times a day the day before Taxotere. Then take two times a day starting the day after chemo for 3 days.  30 tablet  1  . dexlansoprazole (DEXILANT)  60 MG capsule Take 60 mg by mouth daily.      Marland Kitchen dihydroergotamine (MIGRANAL) 4 MG/ML nasal spray Place 1 spray into the nose as needed for migraine. Use in one nostril as directed.  No more than 4 sprays in one hour      . Eflornithine HCl (VANIQA) 13.9 % cream Apply pea-sized amount as directed  30 g  5  . ibuprofen (ADVIL,MOTRIN) 200 MG tablet Take 400 mg by mouth every 8 (eight) hours as needed. For pain      . levofloxacin (LEVAQUIN) 500 MG tablet       . levothyroxine (SYNTHROID, LEVOTHROID) 100 MCG tablet Take 100 mcg by mouth daily.       Marland Kitchen lidocaine-prilocaine (EMLA) cream Apply topically as needed.  30 g  6   . liothyronine (CYTOMEL) 25 MCG tablet Take 12.5 mcg by mouth 2 (two) times daily.       Marland Kitchen LORazepam (ATIVAN) 0.5 MG tablet Take 1 tablet (0.5 mg total) by mouth every 6 (six) hours as needed (Nausea or vomiting).  30 tablet  0  . methylPREDNIsolone (MEDROL DOSPACK) 4 MG tablet       . mometasone (NASONEX) 50 MCG/ACT nasal spray Place 2 sprays into both nostrils daily.       . Nutritional Supplements (JUICE PLUS FIBRE PO) Take 1 tablet by mouth daily.      . ondansetron (ZOFRAN) 8 MG tablet Take 1 tablet (8 mg total) by mouth 2 (two) times daily. Take two times a day starting the day after chemo for 3 days. Then take two times a day as needed for nausea or vomiting.  30 tablet  1  . prochlorperazine (COMPAZINE) 10 MG tablet Take 1 tablet (10 mg total) by mouth every 6 (six) hours as needed (Nausea or vomiting).  30 tablet  1  . ranitidine (ZANTAC) 300 MG tablet Take 300 mg by mouth at bedtime.      . topiramate (TOPAMAX) 100 MG tablet Take 200 mg by mouth at bedtime.       No current facility-administered medications for this visit.    OB/GYN History:menarche at age 59 she is premenopausal last menstrual period was on 08/11/2013. First live birth at 22.  Fertility Discussion:patient has completed her family Prior History of Cancer: no  Health Maintenance:  Colonoscopy no Bone Densityno Last PAP smearup-to-date  ECOG PERFORMANCE STATUS: 0 - Asymptomatic  Genetic Counseling/testing: yes  REVIEW OF SYSTEMS:  A comprehensive review of systems was negative.  PHYSICAL EXAMINATION: Blood pressure 151/87, pulse 104, temperature 98.6 F (37 C), temperature source Oral, resp. rate 20, height 5\' 2"  (1.575 m), weight 213 lb 9.6 oz (96.888 kg).  ZOX:WRUEA, healthy, no distress, well nourished and well developed SKIN: skin color, texture, turgor are normal HEAD: Normocephalic EYES: PERRLA, EOMI, Conjunctiva are pink and non-injected EARS: External ears normal OROPHARYNX:no exudate and  lips, buccal mucosa, and tongue normal  NECK: no adenopathy LYMPH:  no hepatosplenomegaly BREAST:left breast normal without mass, skin or nipple changes or axillary nodes, right breast and right axilla palpable masses there is erythema ecchymosis secondary to the biopsy LUNGS: clear to auscultation and percussion HEART: regular rate & rhythm ABDOMEN:abdomen soft, non-tender, normal bowel sounds and no masses or organomegaly BACK: No CVA tenderness EXTREMITIES:less then 2 second capillary refill, no edema, no clubbing, no cyanosis  NEURO: alert & oriented x 3 with fluent speech, no focal motor/sensory deficits, gait normal     STUDIES/RESULTS: US Breast Right  08/16/2013   CLINICAL DATA:  Patient returns for evaluation of a mass in the right breast noted on recent screening study dated 07/19/2013.  EXAM: DIGITAL DIAGNOSTIC  RIGHT LIMITED MAMMOGRAM  ULTRASOUND RIGHT BREAST  COMPARISON:  10/21/2007  ACR Breast Density Category b: There are scattered areas of fibroglandular density.  FINDINGS: Additional views confirm the presence of a spiculated mass with microcalcifications in the 6 o'clock position of the right breast posteriorly. This measures at least 4.2 x 3.9 x 2.8 cm mammographically. There are highly suspicious pleomorphic microcalcifications both within the mass and extending anteriorly from the mass towards the nipple. In addition, there are grouped calcifications superior and lateral to the mass which cover a length of approximately 18 mm.  On physical exam, I palpate a 3-4 cm mass at 6 o'clock 5 cm from the right nipple.  Ultrasound is performed, showing an irregular hypoechoic mass with microcalcifications at 6 o'clock 5 cm from the right nipple. This measures at least 2.0 x 1.9 x 3.0 cm. It has a hyperechoic halo. There is a hypoechoic tubular structure with microcalcifications extending a length of 1.7 cm from the superior margin the mass towards the nipple. Sonography of the right axilla  demonstrates a lymph node with a markedly thickened cortex.  The appearance is highly suspicious for invasive mammary carcinoma with associated ductal carcinoma and axillary metastasis. Ultrasound-guided core needle biopsy of the mass is suggested today. If carcinoma is confirmed, MRI could be performed to assess for any additional areas that may need to be biopsied if breast conservation is desired.  IMPRESSION: Highly suspicious mass with microcalcifications an abnormal right axillary lymph node as described above.  RECOMMENDATION: Ultrasound-guided core needle biopsy of the mass is recommended and will be performed and reported separately.  I have discussed the findings and recommendations with the patient. Results were also provided in writing at the conclusion of the visit. If applicable, a reminder letter will be sent to the patient regarding the next appointment.  Report was telephoned to [facility] at Dr. Reynold Bowen office.  BI-RADS CATEGORY  5: Highly suggestive of malignancy - appropriate action should be taken.   Electronically Signed   By: Cain Saupe M.D.   On: 08/16/2013 10:41   Mr Breast Bilateral W Wo Contrast  08/25/2013   CLINICAL DATA:  42 year old female with recently diagnosed invasive ductal carcinoma of the right breast with right axillary lymph node metastases.  EXAM: BILATERAL BREAST MRI WITH AND WITHOUT CONTRAST  LABS:  No recent labs.  TECHNIQUE: Multiplanar, multisequence MR images of both breasts were obtained prior to and following the intravenous administration of 20ml of MultiHance.  THREE-DIMENSIONAL MR IMAGE RENDERING ON INDEPENDENT WORKSTATION:  Three-dimensional MR images were rendered by post-processing of the original MR data on an independent workstation. The three-dimensional MR images were interpreted, and findings are reported in the following complete MRI report for this study. Three dimensional images were evaluated at the independent DynaCad workstation   COMPARISON:  Previous exams  FINDINGS: Breast composition: c:  Heterogeneous fibroglandular tissue  Background parenchymal enhancement: Mild.  Right breast: Large irregular spiculated mass in the lower outer quadrant of the right breast with contiguous linear clumped non mass enhancement extending posteriorly consistent with known biopsy proven malignancy measures approximately 8.2 cm AP, 5.6 cm transverse, and 3.1 cm craniocaudal. Biopsy clip artifact is present along the inferior margin of the dominant mass. A probable satellite nodule just superior to the dominant mass measures approximately 6 mm. No findings to  suggest skin, nipple, or pectoralis muscle involvement.  Left breast: There is linear non mass enhancement in the inferior left breast at the approximate 6 o'clock position mid to posterior depth measuring approximately 9 mm AP. No abnormal skin, nipple, or pectoralis enhancement.  Lymph nodes: Enlarged and morphologically abnormal lymph node in the right axilla measures 2.5 x 1.2 cm, compatible with known right axillary nodal metastases. No internal mammary lymphadenopathy seen.  Ancillary findings:  None.  IMPRESSION: 1. Biopsy proven malignancy in the lower outer right breast with contiguous linear clumped enhancement extending posteriorly and a probable small satellite nodule present superiorly, with total extent of disease measuring up to 8.2 cm AP. Morphologically abnormal lymph node in the right axilla, compatible with biopsy proven nodal metastases.  2. Indeterminate linear non mass enhancement in the inferior left breast at the approximate 6 o'clock position mid to posterior depth measuring approximately 9 mm.  RECOMMENDATION: 1. MRI guided biopsy of the indeterminate linear non mass enhancement in the inferior left breast is recommended.  2.  Treatment plan for known right breast malignancy.  BI-RADS CATEGORY  4: Suspicious abnormality - biopsy should be considered.   Electronically Signed   By:  Edwin Cap M.D.   On: 08/25/2013 14:02   Mm Digital Diag Ltd R  08/16/2013   CLINICAL DATA:  Patient returns for evaluation of a mass in the right breast noted on recent screening study dated 07/19/2013.  EXAM: DIGITAL DIAGNOSTIC  RIGHT LIMITED MAMMOGRAM  ULTRASOUND RIGHT BREAST  COMPARISON:  10/21/2007  ACR Breast Density Category b: There are scattered areas of fibroglandular density.  FINDINGS: Additional views confirm the presence of a spiculated mass with microcalcifications in the 6 o'clock position of the right breast posteriorly. This measures at least 4.2 x 3.9 x 2.8 cm mammographically. There are highly suspicious pleomorphic microcalcifications both within the mass and extending anteriorly from the mass towards the nipple. In addition, there are grouped calcifications superior and lateral to the mass which cover a length of approximately 18 mm.  On physical exam, I palpate a 3-4 cm mass at 6 o'clock 5 cm from the right nipple.  Ultrasound is performed, showing an irregular hypoechoic mass with microcalcifications at 6 o'clock 5 cm from the right nipple. This measures at least 2.0 x 1.9 x 3.0 cm. It has a hyperechoic halo. There is a hypoechoic tubular structure with microcalcifications extending a length of 1.7 cm from the superior margin the mass towards the nipple. Sonography of the right axilla demonstrates a lymph node with a markedly thickened cortex.  The appearance is highly suspicious for invasive mammary carcinoma with associated ductal carcinoma and axillary metastasis. Ultrasound-guided core needle biopsy of the mass is suggested today. If carcinoma is confirmed, MRI could be performed to assess for any additional areas that may need to be biopsied if breast conservation is desired.  IMPRESSION: Highly suspicious mass with microcalcifications an abnormal right axillary lymph node as described above.  RECOMMENDATION: Ultrasound-guided core needle biopsy of the mass is recommended and  will be performed and reported separately.  I have discussed the findings and recommendations with the patient. Results were also provided in writing at the conclusion of the visit. If applicable, a reminder letter will be sent to the patient regarding the next appointment.  Report was telephoned to [facility] at Dr. Reynold Bowen office.  BI-RADS CATEGORY  5: Highly suggestive of malignancy - appropriate action should be taken.   Electronically Signed   By: Cain Saupe  M.D.   On: 08/16/2013 10:41   Mm Digital Diagnostic Unilat R  08/16/2013   CLINICAL DATA:  Ultrasound-guided core needle biopsy of a mass at 6 o'clock 5 cm from the right nipple with clip placement.  EXAM: DIAGNOSTIC RIGHT MAMMOGRAM POST ULTRASOUND BIOPSY  COMPARISON:  Previous exams  FINDINGS: Mammographic images were obtained following ultrasound guided biopsy of a mass at 6 o'clock 5 cm from the right nipple. The coil clip is appropriately positioned within the mass.  IMPRESSION: Appropriate clip placement following ultrasound-guided core needle biopsy of the mass at 6 o'clock 5 cm from the right nipple.  Final Assessment: Post Procedure Mammograms for Marker Placement   Electronically Signed   By: Cain Saupe M.D.   On: 08/16/2013 10:48   Mm Radiologist Eval And Mgmt  08/17/2013   EXAM: ESTABLISHED PATIENT OFFICE VISIT -LEVEL II [phone])  HISTORY OF PRESENT ILLNESS: Spiculated mass in the right breast at 6 o'clock biopsied with ultrasound guidance on 08/15/2013  CHIEF COMPLAINT: The patient returns to obtain pathology results for ultrasound-guided biopsy of right breast mass and abnormal right lymph node performed on 08/15/2013  PHYSICAL EXAMINATION: Both biopsy sites are dry with no evidence for seepage or signs of infection. A small area of bruising is present associated with the biopsy site for the mass and the patient reports some discomfort with this biopsy. No discomfort is present associated with the biopsy site of the lymph  node.  ASSESSMENT AND PLAN: The patient has been scheduled for surgical followup with Dr. Carolynne Edouard on 08/24/2013 and MRI of the breast on 08/25/2013. All of the patient's questions were answered.  CONSULTATION: PATHOLOGY: Biopsy of the mass reveals grade 2 invasive ductal carcinoma and ductal carcinoma in situ with necrosis. Biopsy of the lymph node reveals almost complete involvement with carcinoma.  CONCORDANT:  Yes  RECOMMENDATION: Surgical consultation and MRI of the breast as scheduled above   Electronically Signed   By: Leda Gauze M.D.   On: 08/17/2013 17:19   [place] Rt Breast Bx W Loc Dev 1st Lesion Img Bx Spec US Guide  08/16/2013   CLINICAL DATA:  Spiculated mass with microcalcifications at 6 o'clock 5 cm from the right nipple.  EXAM: ULTRASOUND GUIDED RIGHT BREAST CORE NEEDLE BIOPSY WITH VACUUM ASSIST  COMPARISON:  Previous exams.  PROCEDURE: I met with the patient and we discussed the procedure of ultrasound-guided biopsy, including benefits and alternatives. We discussed the high likelihood of a successful procedure. We discussed the risks of the procedure including infection, bleeding, tissue injury, clip migration, and inadequate sampling. Informed written consent was given. The usual time-out protocol was performed immediately prior to the procedure.  Using sterile technique, 2% lidocaine for local anesthesia, and ultrasound guidance, a 12 gauge vacuum-assisteddevice was used to perform biopsy of the mass at 6 o'clock in the right breast using a College cranial approach. At the conclusion of the procedure, a coil tissue marker clip was deployed into the biopsy cavity. Follow-up 2-view mammogram was performed and dictated separately.  IMPRESSION: Ultrasound-guided biopsy of a spiculated mass with microcalcifications at 6 o'clock 5 cm from the right nipple. No apparent complications.   Electronically Signed   By: Cain Saupe M.D.   On: 08/16/2013 10:46   [place] Rt Breast Bx W Loc Dev Ea Add Lesion Img  Bx Spec US Guide  08/16/2013   CLINICAL DATA:  Abnormal right axillary lymph node.  EXAM: ULTRASOUND GUIDED RIGHT AXILLA CORE NEEDLE BIOPSY  COMPARISON:  Previous exams.  FINDINGS: I met with the patient and we discussed the procedure of ultrasound-guided biopsy, including benefits and alternatives. We discussed the high likelihood of a successful procedure. We discussed the risks of the procedure, including infection, bleeding, tissue injury, clip migration, and inadequate sampling. Informed written consent was given. The usual time-out protocol was performed immediately prior to the procedure.  Using sterile technique, 2% Lidocaine as local anesthetic, and ultrasound guidance, a 14 gauge spring-loadeddevice was used to perform biopsy of the abnormal right axillary lymph node using an oblique lateromedial approach.  IMPRESSION: Ultrasound guided biopsy of an abnormal right axillary lymph node. No apparent complications.   Electronically Signed   By: Cain Saupe M.D.   On: 08/16/2013 10:45     LABS:    Chemistry      Component Value Date/Time   NA 138 08/30/2013 0916   NA 139 08/29/2013 0842   K 4.0 08/30/2013 0916   K 3.9 08/29/2013 0842   CL 105 08/30/2013 0916   CO2 21 08/30/2013 0916   CO2 20* 08/29/2013 0842   BUN 10 08/30/2013 0916   BUN 10.0 08/29/2013 0842   CREATININE 0.60 08/30/2013 0916   CREATININE 0.8 08/29/2013 0842      Component Value Date/Time   CALCIUM 9.3 08/30/2013 0916   CALCIUM 9.4 08/29/2013 0842   ALKPHOS 90 08/29/2013 0842   AST 11 08/29/2013 0842   ALT 12 08/29/2013 0842   BILITOT 0.50 08/29/2013 0842      Lab Results  Component Value Date   WBC 8.9 08/30/2013   HGB 15.8* 08/30/2013   HCT 44.1 08/30/2013   MCV 87.2 08/30/2013   PLT 202 08/30/2013       PATHOLOGY: ADDITIONAL INFORMATION: 1. PROGNOSTIC INDICATORS - ACIS Results: IMMUNOHISTOCHEMICAL AND MORPHOMETRIC ANALYSIS BY THE AUTOMATED CELLULAR IMAGING SYSTEM (ACIS) Estrogen  Receptor: 100%, POSITIVE, STRONG STAINING INTENSITY Progesterone Receptor: 100%, POSITIVE, STRONG STAINING INTENSITY Proliferation Marker Ki67: 72% REFERENCE RANGE ESTROGEN RECEPTOR NEGATIVE <1% POSITIVE =>1% PROGESTERONE RECEPTOR NEGATIVE <1% POSITIVE =>1% All controls stained appropriately Pecola Leisure MD Pathologist, Electronic Signature ( Signed 08/23/2013) 1. CHROMOGENIC IN-SITU HYBRIDIZATION Results: HER2/NEU BY CISH - SHOWS AMPLIFICATION BY CISH ANALYSIS. RESULT RATIO OF HER2: CEP 17 SIGNALS 5.77 AVERAGE HER2 COPY NUMBER PER CELL 7.50 REFERENCE RANGE 1 of 4 FINAL for Joan Valdez, Joan Valdez ([id number]) ADDITIONAL INFORMATION:(continued) NEGATIVE HER2/Chr17 Ratio <2.0 and Average HER2 copy number <4.0 EQUIVOCAL HER2/Chr17 Ratio <2.0 and Average HER2 copy number 4.0 and <6.0 POSITIVE HER2/Chr17 Ratio >=2.0 and/or Average HER2 copy number >=6.0 Pecola Leisure MD Pathologist, Electronic Signature ( Signed 08/22/2013) 2. PROGNOSTIC INDICATORS - ACIS Results: IMMUNOHISTOCHEMICAL AND MORPHOMETRIC ANALYSIS BY THE AUTOMATED CELLULAR IMAGING SYSTEM (ACIS) Estrogen Receptor: 100%, POSITIVE, STRONG STAINING INTENSITY Progesterone Receptor: 99%, POSITIVE, STRONG STAINING INTENSITY Proliferation Marker Ki67: 59% REFERENCE RANGE ESTROGEN RECEPTOR NEGATIVE <1% POSITIVE =>1% PROGESTERONE RECEPTOR NEGATIVE <1% POSITIVE =>1% All controls stained appropriately Pecola Leisure MD Pathologist, Electronic Signature ( Signed 08/23/2013) 2. CHROMOGENIC IN-SITU HYBRIDIZATION Results: HER2/NEU BY CISH - SHOWS AMPLIFICATION BY CISH ANALYSIS. RESULT RATIO OF HER2: CEP 17 SIGNALS 5.73 AVERAGE HER2 COPY NUMBER PER CELL 7.45 REFERENCE RANGE NEGATIVE HER2/Chr17 Ratio <2.0 and Average HER2 copy number <4.0 EQUIVOCAL HER2/Chr17 Ratio <2.0 and Average HER2 copy number 4.0 and <6.0 POSITIVE HER2/Chr17 Ratio >=2.0 and/or Average HER2 copy number >=6.0 Pecola Leisure MD Pathologist, Electronic  Signature ( Signed 08/22/2013) 2 of 4 FINAL for Joan Valdez, Joan Valdez ([id number]) FINAL DIAGNOSIS Diagnosis 1. Breast, right, needle core biopsy, mass, 6 o'clock, 5 cm fn -  INVASIVE DUCTAL CARCINOMA. - DUCTAL CARCINOMA IN SITU WITH NECROSIS. - SEE COMMENT. 2. Lymph node, needle/core biopsy, right axilla - POSITIVE FOR DUCTAL CARCINOMA. - SEE COMMENT. Microscopic Comment 1. Although definitive grading of breast carcinoma is best done on excision, the features of the invasive tumor from the right 6 o'clock mass, 5 cm from nipple are compatible with a grade II breast carcinoma. Breast prognostic markers will be performed and reported in an addendum. Findings are called to the [facility] on 08/17/13. Dr. Colonel Bald has seen this case in consultation with agreement. 2. Of note, almost the entire needle core biopsy of the specimen submitted a right axillary lymph node demonstrates nests of invasive ductal carcinoma. The findings may represent a replaced lymph node with tumor. Please correlate with clinical and radiologic impression. A breast prognostic profile will be performed on this second specimen and reported in an addendum. The findings are called to the [facility] on 08/17/13. Dr. Colonel Bald has seen the second specimen in consultation with agreement. (RAH:caf 08/17/13) Zandra Abts MD Pathologist, Electronic Signature (Case signed 08/17/2013) Specimen Gross and Clinical Information Specimen Comment 1. 3 cm palpable spiculated mass with microcalcifications In formalin 9:50, extracted <1 min 2. Abnormal right axillary node In formalin 9:55, extracted <1 min Specimen(s) Obtained: 1. Breast, right, needle core biopsy  ASSESSMENT    42 year old female with new diagnosis of triple positive breast cancer stage III node positive. Patient is seen in medical oncology today for discussion of treatment options. We discussed the pathophysiology of breast cancer, we  discussed the pathology, we discussed multimodality treatment options including surgery, radiation, and chemotherapy. We discussed different approaches for chemotherapy including neoadjuvant chemotherapy adjuvant chemotherapy. This patient is a good candidate for neoadjuvant treatment based on her tumor size by MRI. Patient also is HER-2 positive and therefore she would also get anti-HER-2 therapy in combination with chemotherapy. We discussed different types of chemotherapy is including Taxotere carboplatinum with anti-HER-2 therapy including Herceptin and perjeta.. We discussed risks benefits and side effects of this treatment.  Patient will need to genetic counseling, she will also need an echocardiogram and a referral to cardiology prior to starting antidepressant to therapy. She will also need chemotherapy teaching class as well as a Port-A-Cath placed. We'll ask Dr. Carolynne Edouard to do that procedure for [place].   I would like to do staging studies on this patient including PET CT.    Clinical Trial Eligibility: no Multidisciplinary conference discussion yes     PLAN:    #1 patient will proceed with Port-A-Cath placement.  #2 she will have chemotherapy teaching class, echocardiogram, staging studies,.  #3 I discussed treatment options including this side effects risks and benefits. She will need an echocardiogram for the anti-her to therapy.all of her antiemetics prescriptions were sent to her pharmacy.  #4 plan is to get her started on chemotherapy within 1 week.        Discussion: Patient is being treated per NCCN breast cancer care guidelines appropriate for stage.III   Thank you so much for allowing me to participate in the care of [facility]. I will continue to follow up the patient with you and assist in her care.  All questions were answered. The patient knows to call the clinic with any problems, questions or concerns. We can certainly see the patient much sooner if  necessary.  I spent 60 minutes counseling the patient face to face. The total time spent in the appointment was 60 minutes.  Drue Second, MD Medical/Oncology [facility] [phone] (beeper) [phone] (Office)  09/01/2013, 3:21 AM

## 2013-09-01 NOTE — Progress Notes (Signed)
Mailed after appt letter to pt. 

## 2013-09-03 MED ORDER — VANCOMYCIN HCL IN DEXTROSE 1-5 GM/200ML-% IV SOLN: 1000.0000 mg | INTRAVENOUS | Status: DC

## 2013-09-04 ENCOUNTER — Encounter: Payer: Self-pay | Admitting: *Deleted

## 2013-09-04 ENCOUNTER — Ambulatory Visit (HOSPITAL_COMMUNITY): Payer: BC Managed Care – PPO

## 2013-09-04 ENCOUNTER — Other Ambulatory Visit: Payer: BC Managed Care – PPO

## 2013-09-04 ENCOUNTER — Encounter (HOSPITAL_COMMUNITY)
Admission: RE | Disposition: A | Discharge status: Home / Self Care | Payer: Self-pay | Source: Ambulatory Visit | Attending: General Surgery

## 2013-09-04 ENCOUNTER — Encounter (HOSPITAL_COMMUNITY): Payer: Self-pay | Admitting: Anesthesiology

## 2013-09-04 ENCOUNTER — Ambulatory Visit (HOSPITAL_COMMUNITY): Payer: BC Managed Care – PPO | Admitting: Certified Registered Nurse Anesthetist

## 2013-09-04 ENCOUNTER — Ambulatory Visit (HOSPITAL_COMMUNITY)
Admission: RE | Admit: 2013-09-04 | Discharge: 2013-09-04 | Disposition: A | Discharge status: Home / Self Care | Payer: BC Managed Care – PPO | Source: Ambulatory Visit | Attending: General Surgery | Admitting: General Surgery

## 2013-09-04 ENCOUNTER — Encounter (HOSPITAL_COMMUNITY): Payer: BC Managed Care – PPO | Admitting: Certified Registered Nurse Anesthetist

## 2013-09-04 DIAGNOSIS — C50511 Malignant neoplasm of lower-outer quadrant of right female breast: Secondary | ICD-10-CM

## 2013-09-04 DIAGNOSIS — C50919 Malignant neoplasm of unspecified site of unspecified female breast: Secondary | ICD-10-CM | POA: Insufficient documentation

## 2013-09-04 HISTORY — PX: PORTACATH PLACEMENT: SHX2246

## 2013-09-04 SURGERY — INSERTION, TUNNELED CENTRAL VENOUS DEVICE, WITH PORT
Anesthesia: Monitor Anesthesia Care

## 2013-09-04 MED ORDER — HEPARIN SOD (PORK) LOCK FLUSH 100 UNIT/ML IV SOLN
INTRAVENOUS | Status: DC | PRN
  Administered 2013-09-04: 500 [IU] via INTRAVENOUS

## 2013-09-04 MED ORDER — HEPARIN SOD (PORK) LOCK FLUSH 100 UNIT/ML IV SOLN
INTRAVENOUS | Status: AC
  Filled 2013-09-04: qty 5

## 2013-09-04 MED ORDER — BUPIVACAINE HCL (PF) 0.25 % IJ SOLN
INTRAMUSCULAR | Status: DC | PRN
  Administered 2013-09-04: 20 mL

## 2013-09-04 MED ORDER — HYDROMORPHONE HCL PF 1 MG/ML IJ SOLN
0.2500 mg | INTRAMUSCULAR | Status: DC | PRN
  Administered 2013-09-04: 0.25 mg via INTRAVENOUS

## 2013-09-04 MED ORDER — CHLORHEXIDINE GLUCONATE 4 % EX LIQD: 1.0000 "application " | Freq: Once | CUTANEOUS | Status: DC

## 2013-09-04 MED ORDER — DEXAMETHASONE SODIUM PHOSPHATE 4 MG/ML IJ SOLN
INTRAMUSCULAR | Status: DC | PRN
  Administered 2013-09-04: 4 mg via INTRAVENOUS

## 2013-09-04 MED ORDER — LACTATED RINGERS IV SOLN
INTRAVENOUS | Status: DC
  Administered 2013-09-04: 13:00:00 via INTRAVENOUS

## 2013-09-04 MED ORDER — LIDOCAINE HCL (PF) 1 % IJ SOLN
INTRAMUSCULAR | Status: AC
  Filled 2013-09-04: qty 30

## 2013-09-04 MED ORDER — FENTANYL CITRATE 0.05 MG/ML IJ SOLN
INTRAMUSCULAR | Status: DC | PRN
  Administered 2013-09-04 (×3): 50 ug via INTRAVENOUS
  Administered 2013-09-04: 100 ug via INTRAVENOUS

## 2013-09-04 MED ORDER — MIDAZOLAM HCL 5 MG/5ML IJ SOLN
INTRAMUSCULAR | Status: DC | PRN
  Administered 2013-09-04: 2 mg via INTRAVENOUS

## 2013-09-04 MED ORDER — OXYCODONE-ACETAMINOPHEN 5-325 MG PO TABS: 1.0000 | ORAL_TABLET | ORAL | Status: DC | PRN

## 2013-09-04 MED ORDER — BUPIVACAINE HCL (PF) 0.25 % IJ SOLN
INTRAMUSCULAR | Status: AC
  Filled 2013-09-04: qty 30

## 2013-09-04 MED ORDER — SODIUM CHLORIDE 0.9 % IR SOLN
Status: DC | PRN
  Administered 2013-09-04: 16:00:00

## 2013-09-04 MED ORDER — PROPOFOL 10 MG/ML IV BOLUS
INTRAVENOUS | Status: DC | PRN
  Administered 2013-09-04: 180 mg via INTRAVENOUS

## 2013-09-04 MED ORDER — ONDANSETRON HCL 4 MG/2ML IJ SOLN: 4.0000 mg | Freq: Once | INTRAMUSCULAR | Status: DC | PRN

## 2013-09-04 MED ORDER — ONDANSETRON HCL 4 MG/2ML IJ SOLN
INTRAMUSCULAR | Status: DC | PRN
  Administered 2013-09-04: 4 mg via INTRAVENOUS

## 2013-09-04 MED ORDER — LIDOCAINE HCL (CARDIAC) 20 MG/ML IV SOLN
INTRAVENOUS | Status: DC | PRN
  Administered 2013-09-04: 40 mg via INTRAVENOUS

## 2013-09-04 MED ORDER — HYDROMORPHONE HCL PF 1 MG/ML IJ SOLN
INTRAMUSCULAR | Status: AC
  Filled 2013-09-04: qty 1

## 2013-09-04 MED ORDER — VANCOMYCIN HCL IN DEXTROSE 1-5 GM/200ML-% IV SOLN
INTRAVENOUS | Status: AC
  Administered 2013-09-04: 1000 mg via INTRAVENOUS
  Filled 2013-09-04: qty 200

## 2013-09-04 SURGICAL SUPPLY — 51 items
ADH SKN CLS APL DERMABOND .7 (GAUZE/BANDAGES/DRESSINGS) ×1
BAG DECANTER FOR FLEXI CONT (MISCELLANEOUS) ×2 IMPLANT
BLADE SURG 15 STRL LF DISP TIS (BLADE) ×1 IMPLANT
BLADE SURG 15 STRL SS (BLADE) ×2
CHLORAPREP W/TINT 10.5 ML (MISCELLANEOUS) ×2 IMPLANT
COVER SURGICAL LIGHT HANDLE (MISCELLANEOUS) ×2 IMPLANT
CRADLE DONUT ADULT HEAD (MISCELLANEOUS) ×2 IMPLANT
DECANTER SPIKE VIAL GLASS SM (MISCELLANEOUS) ×2 IMPLANT
DERMABOND ADVANCED (GAUZE/BANDAGES/DRESSINGS) ×1
DERMABOND ADVANCED .7 DNX12 (GAUZE/BANDAGES/DRESSINGS) ×1 IMPLANT
DRAPE C-ARM 42X72 X-RAY (DRAPES) ×2 IMPLANT
DRAPE CHEST BREAST 15X10 FENES (DRAPES) ×2 IMPLANT
DRAPE UTILITY 15X26 W/TAPE STR (DRAPE) ×4 IMPLANT
ELECT CAUTERY BLADE 6.4 (BLADE) ×2 IMPLANT
ELECT REM PT RETURN 9FT ADLT (ELECTROSURGICAL) ×2
ELECTRODE REM PT RTRN 9FT ADLT (ELECTROSURGICAL) ×1 IMPLANT
GAUZE SPONGE 4X4 16PLY XRAY LF (GAUZE/BANDAGES/DRESSINGS) ×2 IMPLANT
GLOVE BIO SURGEON STRL SZ7.5 (GLOVE) ×2 IMPLANT
GLOVE BIOGEL PI IND STRL 6.5 (GLOVE) IMPLANT
GLOVE BIOGEL PI INDICATOR 6.5 (GLOVE) ×1
GLOVE ECLIPSE 6.5 STRL STRAW (GLOVE) ×1 IMPLANT
GOWN STRL NON-REIN LRG LVL3 (GOWN DISPOSABLE) ×4 IMPLANT
INTRODUCER COOK 11FR (CATHETERS) IMPLANT
KIT BASIN OR (CUSTOM PROCEDURE TRAY) ×2 IMPLANT
KIT PORT POWER 8FR ISP CVUE (Catheter) ×1 IMPLANT
KIT PORT POWER 9.6FR MRI PREA (Catheter) IMPLANT
KIT PORT POWER ISP 8FR (Catheter) IMPLANT
KIT POWER CATH 8FR (Catheter) IMPLANT
KIT ROOM TURNOVER OR (KITS) ×2 IMPLANT
NDL HYPO 25GX1X1/2 BEV (NEEDLE) ×1 IMPLANT
NEEDLE 22X1 1/2 (OR ONLY) (NEEDLE) IMPLANT
NEEDLE HYPO 25GX1X1/2 BEV (NEEDLE) ×2 IMPLANT
NS IRRIG 1000ML POUR BTL (IV SOLUTION) ×2 IMPLANT
PACK SURGICAL SETUP 50X90 (CUSTOM PROCEDURE TRAY) ×2 IMPLANT
PAD ARMBOARD 7.5X6 YLW CONV (MISCELLANEOUS) ×2 IMPLANT
PENCIL BUTTON HOLSTER BLD 10FT (ELECTRODE) ×2 IMPLANT
SET INTRODUCER 12FR PACEMAKER (SHEATH) IMPLANT
SET SHEATH INTRODUCER 10FR (MISCELLANEOUS) IMPLANT
SHEATH COOK PEEL AWAY SET 9F (SHEATH) IMPLANT
SUT MNCRL AB 4-0 PS2 18 (SUTURE) ×2 IMPLANT
SUT PROLENE 2 0 SH 30 (SUTURE) ×4 IMPLANT
SUT SILK 2 0 (SUTURE)
SUT SILK 2-0 18XBRD TIE 12 (SUTURE) IMPLANT
SUT VIC AB 3-0 SH 27 (SUTURE) ×2
SUT VIC AB 3-0 SH 27XBRD (SUTURE) ×1 IMPLANT
SYR 20ML ECCENTRIC (SYRINGE) ×4 IMPLANT
SYR 5ML LUER SLIP (SYRINGE) ×2 IMPLANT
SYR CONTROL 10ML LL (SYRINGE) ×2 IMPLANT
SYRINGE 10CC LL (SYRINGE) IMPLANT
TOWEL OR 17X24 6PK STRL BLUE (TOWEL DISPOSABLE) ×2 IMPLANT
TOWEL OR 17X26 10 PK STRL BLUE (TOWEL DISPOSABLE) ×2 IMPLANT

## 2013-09-04 NOTE — Op Note (Signed)
09/04/2013  4:29 PM  PATIENT:  Joan Valdez  42 y.o. female  PRE-OPERATIVE DIAGNOSIS:  right breast cancer  POST-OPERATIVE DIAGNOSIS:  Right breast cancer  PROCEDURE:  Procedure(s): INSERTION PORT-A-CATH (N/A)  SURGEON:  Surgeon(s) and Role:    * Robyne Askew, MD - Primary  PHYSICIAN ASSISTANT:   ASSISTANTS: none   ANESTHESIA:   general  EBL:  Total I/O In: -  Out: 25 [Blood:25]  BLOOD ADMINISTERED:none  DRAINS: none   LOCAL MEDICATIONS USED:  MARCAINE     SPECIMEN:  No Specimen  DISPOSITION OF SPECIMEN:  N/A  COUNTS:  YES  TOURNIQUET:  * No tourniquets in log *  DICTATION: .Dragon Dictation After informed consent was obtained the patient was brought to the operating room and placed in the supine position on the operating room table. After adequate induction of general anesthesia, a roll was placed between the patient's shoulder blades to extend the shoulder slightly. Her left chest and neck area were then prepped with ChloraPrep, allowed to dry, And draped in usual sterile manner. The patient was placed in Trendelenburg position. The area on the left chest lateral to the bend of the clavicle was infiltrated with quarter percent Marcaine. A small stab incision was made with a 15 blade knife. The large bore needle from the Port-A-Cath kit was used to slide beneath the bend of the clavicle heading towards the sternal notch on the left chest and in doing so we were able to access the left subclavian vein without difficulty. A wire was fed through the needle using the Seldinger technique without difficulty. The wire was confirmed in the central venous system using real-time fluoroscopy. The needle was removed. Next the incision on the left chest wall was extended medially and laterally with a 15 blade knife. A subcutaneous pocket inferior to the incision was created by blunt finger dissection as well as some sharp dissection with electrocautery. Next the tubing was attached  to the reservoir. The reservoir was placed in the pocket and the length of the tubing was estimated also using real-time fluoroscopy. Next the sheath and dilator were placed over the wire also using the Seldinger technique without difficulty. The dilator and wire were removed. The tubing was fed through the sheath as far as it can be fed and then held in place while the Sheath was gently cracked and separated. Once this was accomplished the tip of the catheter was confirmed in the distal superior vena cava using real-time fluoroscopy. The tubing was then permanently anchored to the reservoir. The reservoir was anchored in the subcutaneous pocket with 2 2-0 Prolene stitches. The port was then aspirated and it aspirated blood easily. The port was then flushed initially with a dilute heparin solution and then with more concentrated heparin solution. The subcutaneous tissue was closed over the poor with interrupted 3-0 Vicryl stitches. The skin was then closed with a running 4-0 Monocryl subcuticular stitch. Dermabond dressings were applied. The patient tolerated the procedure well. At the end of the case needle sponge and instrument counts were correct. The patient was then awakened and taken to recovery in stable condition.  PLAN OF CARE: Discharge to home after PACU  PATIENT DISPOSITION:  PACU - hemodynamically stable.   Delay start of Pharmacological VTE agent (>24hrs) due to surgical blood loss or risk of bleeding: not applicable

## 2013-09-04 NOTE — H&P (View-Only) (Signed)
Patient ID: Joan Valdez, female   DOB: 14-Oct-1970, 42 y.o.   MRN: [id number]  No chief complaint on file.   HPI Joan Valdez is a 42 y.o. female.  We are asked to see the patient in consultation by Dr. Deboraha Sprang to evaluate her for right breast cancer. The patient is a 42 year old white female who recently went for her first routine screening mammogram. She has not been doing regular self checks. A large mass was seen in the 6:00 position of the right breast. It measured approximately 3 cm for the primary mass with another 2 cm of calcification coming off anteriorly. She also had an abnormal lymph node in the right axilla that was biopsied. Both of these came back as breast cancer. She is scheduled for her MRI tomorrow. Her main complaint is that she has had a cough for the last 2 years that is gradually getting worse. She has never been evaluated with chest x-ray. HPI  Past Medical History  Diagnosis Date  . PCO (polycystic ovaries)   . Hypothyroid   . Allergic rhinitis   . Chronic headache   . Acid reflux   . Cervical dysplasia     Past Surgical History  Procedure Laterality Date  . Cesarean section  2001  . Colposcopy    . Cervical biopsy  w/ loop electrode excision  2002    Family History  Problem Relation Age of Onset  . Hypertension Mother   . Migraines Mother   . Diabetes Paternal Grandmother   . Hypertension Paternal Grandmother   . Rheum arthritis Paternal Grandmother   . Heart disease Maternal Grandmother     Social History History  Substance Use Topics  . Smoking status: Never Smoker   . Smokeless tobacco: Not on file  . Alcohol Use: 1.0 oz/week    2 drink(s) per week     Comment: socially.    Allergies  Allergen Reactions  . Imitrex [Sumatriptan Base] Anaphylaxis  . Amoxicillin Hives  . Bactrim Hives  . Cephalexin Hives  . Erythromycin Hives  . Penicillins Hives  . Strawberry Hives  . Sulfa Antibiotics Hives  . Zithromax [Azithromycin Dihydrate]  Hives    Current Outpatient Prescriptions  Medication Sig Dispense Refill  . Cetirizine HCl (ZYRTEC ALLERGY PO) Take by mouth.      . chlorpheniramine-HYDROcodone (TUSSIONEX PENNKINETIC ER) 10-8 MG/5ML LQCR Take 5 mLs by mouth every 12 (twelve) hours as needed for cough.  180 mL  0  . dexlansoprazole (DEXILANT) 60 MG capsule Take 60 mg by mouth daily.      Marland Kitchen dihydroergotamine (MIGRANAL) 4 MG/ML nasal spray Place 1 spray into the nose as needed for migraine. Use in one nostril as directed.  No more than 4 sprays in one hour      . diphenhydrAMINE (BENADRYL) 25 MG tablet Take 50 mg by mouth every 6 (six) hours as needed. Sinus headache      . Eflornithine HCl (VANIQA) 13.9 % cream Apply pea-sized amount as directed  30 g  5  . ibuprofen (ADVIL,MOTRIN) 200 MG tablet Take 400 mg by mouth every 8 (eight) hours as needed. For pain      . levofloxacin (LEVAQUIN) 500 MG tablet Take 1 tablet (500 mg total) by mouth daily.  7 tablet  0  . levothyroxine (SYNTHROID, LEVOTHROID) 100 MCG tablet Take 100 mcg by mouth daily.       Marland Kitchen liothyronine (CYTOMEL) 25 MCG tablet Take 12.5 mcg by mouth 2 (two) times daily.       Marland Kitchen  methylPREDNISolone (MEDROL DOSEPAK) 4 MG tablet follow package directions  21 tablet  0  . MIRCETTE 0.15-0.02/0.01 MG (21/5) tablet TAKE 1 TABLET BY MOUTH DAILY.  28 tablet  12  . mometasone (NASONEX) 50 MCG/ACT nasal spray Place 2 sprays into the nose daily.      . ranitidine (ZANTAC) 300 MG tablet Take 300 mg by mouth at bedtime.      . topiramate (TOPAMAX) 100 MG tablet TAKE 2 TABLETS BY MOUTH AT BEDTIME  60 tablet  4   No current facility-administered medications for this visit.    Review of Systems Review of Systems  Constitutional: Negative.   HENT: Negative.   Eyes: Negative.   Respiratory: Positive for cough.   Cardiovascular: Negative.   Gastrointestinal: Negative.   Endocrine: Negative.   Genitourinary: Negative.   Musculoskeletal: Negative.   Skin: Negative.    Allergic/Immunologic: Negative.   Neurological: Negative.   Hematological: Negative.   Psychiatric/Behavioral: Negative.     Blood pressure 135/84, pulse 71, temperature 98 F (36.7 C), temperature source Temporal, resp. rate 16, height 5\' 2"  (1.575 m), weight 214 lb 9.6 oz (97.342 kg).  Physical Exam Physical Exam  Constitutional: She is oriented to person, place, and time. She appears well-developed and well-nourished.  HENT:  Head: Normocephalic and atraumatic.  Eyes: Conjunctivae and EOM are normal. Pupils are equal, round, and reactive to light.  Neck: Normal range of motion. Neck supple.  Cardiovascular: Normal rate, regular rhythm and normal heart sounds.   Pulmonary/Chest: Effort normal and breath sounds normal.  There is a large mass in the 6:00 position of the right breast measuring about 4 cm across. It seems mobile and not tethered to the skin or the chest wall. There is no palpable mass in the left breast. There is no palpable axillary, supraclavicular, or cervical lymphadenopathy although she does have a positive right axillary lymph node by biopsy  Abdominal: Soft. Bowel sounds are normal.  Musculoskeletal: Normal range of motion.  Lymphadenopathy:    She has no cervical adenopathy.  Neurological: She is alert and oriented to person, place, and time.  Skin: Skin is warm and dry.  Psychiatric: She has a normal mood and affect. Her behavior is normal.    Data Reviewed As above  Assessment    The patient appears to have a locally advanced right breast cancer. I have discussed with her in detail the different options for surgical treatment. Because of the size of the tumor I think the most appropriate choice for her would be a mastectomy if she were to be operated on now. I think she may benefit from neoadjuvant treatment with chemotherapy. If she chooses this without she will probably need to have a Port-A-Cath placed. I've discussed with her in detail the risks and  benefits of placing a Port-A-Cath as well as some of the technical aspects and she understands and wishes to proceed     Plan    Plan for referral to medical oncology to consider neoadjuvant therapy and also plan for Port-A-Cath placement        TOTH III,Andrick Rust S 08/24/2013, 4:36 PM

## 2013-09-04 NOTE — Transfer of Care (Signed)
Immediate Anesthesia Transfer of Care Note  Patient: Joan Valdez  Procedure(s) Performed: Procedure(s): INSERTION PORT-A-CATH (N/A)  Patient Location: PACU  Anesthesia Type:General  Level of Consciousness: awake  Airway & Oxygen Therapy: Patient Spontanous Breathing and Patient connected to nasal cannula oxygen  Post-op Assessment: Report given to PACU RN and Post -op Vital signs reviewed and stable  Post vital signs: stable  Complications: No apparent anesthesia complications

## 2013-09-04 NOTE — Preoperative (Signed)
Beta Blockers   Reason not to administer Beta Blockers:Not Applicable 

## 2013-09-04 NOTE — Anesthesia Postprocedure Evaluation (Signed)
  Anesthesia Post-op Note  Patient: Joan Valdez  Procedure(s) Performed: Procedure(s): INSERTION PORT-A-CATH (N/A)  Patient Location: PACU  Anesthesia Type:General  Level of Consciousness: awake  Airway and Oxygen Therapy: Patient Spontanous Breathing  Post-op Pain: mild  Post-op Assessment: Post-op Vital signs reviewed  Post-op Vital Signs: Reviewed  Complications: No apparent anesthesia complications

## 2013-09-04 NOTE — Interval H&P Note (Signed)
History and Physical Interval Note:  09/04/2013 2:53 PM  Joan Valdez  has presented today for surgery, with the diagnosis of right breast cancer  The various methods of treatment have been discussed with the patient and family. After consideration of risks, benefits and other options for treatment, the patient has consented to  Procedure(s): INSERTION PORT-A-CATH (N/A) as a surgical intervention .  The patient's history has been reviewed, patient examined, no change in status, stable for surgery.  I have reviewed the patient's chart and labs.  Questions were answered to the patient's satisfaction.     TOTH III,Elania Crowl S

## 2013-09-04 NOTE — Anesthesia Preprocedure Evaluation (Signed)
Anesthesia Evaluation   Patient awake    Reviewed: Allergy & Precautions, H&P , NPO status , Patient's Chart, lab work & pertinent test results  Airway Mallampati: II TM Distance: >3 FB Neck ROM: Full    Dental  (+) Teeth Intact and Dental Advisory Given   Pulmonary  breath sounds clear to auscultation        Cardiovascular Rhythm:Regular Rate:Normal     Neuro/Psych    GI/Hepatic   Endo/Other    Renal/GU      Musculoskeletal   Abdominal (+) + obese,   Peds  Hematology   Anesthesia Other Findings   Reproductive/Obstetrics                           Anesthesia Physical Anesthesia Plan  ASA: II  Anesthesia Plan: MAC   Post-op Pain Management:    Induction: Intravenous  Airway Management Planned: Natural Airway and Simple Face Mask  Additional Equipment:   Intra-op Plan:   Post-operative Plan:   Informed Consent: I have reviewed the patients History and Physical, chart, labs and discussed the procedure including the risks, benefits and alternatives for the proposed anesthesia with the patient or authorized representative who has indicated his/her understanding and acceptance.   Dental advisory given  Plan Discussed with: CRNA and Anesthesiologist  Anesthesia Plan Comments: (R. Breast Ca Migraines Obesity Multiple allergies GERD  Plan MAC  Kipp Brood, MD)        Anesthesia Quick Evaluation

## 2013-09-05 ENCOUNTER — Encounter: Payer: Self-pay | Admitting: *Deleted

## 2013-09-05 ENCOUNTER — Other Ambulatory Visit: Payer: Self-pay | Admitting: Family Medicine

## 2013-09-05 ENCOUNTER — Encounter (HOSPITAL_COMMUNITY): Payer: Self-pay | Admitting: General Surgery

## 2013-09-05 MED ORDER — OSELTAMIVIR PHOSPHATE 75 MG PO CAPS: 75.0000 mg | ORAL_CAPSULE | Freq: Every day | ORAL | Status: DC

## 2013-09-05 NOTE — Progress Notes (Signed)
[facility] Psychosocial Distress Screening Clinical Social Work  Clinical Social Work was referred by distress screening protocol.  The patient scored a 5 on the Psychosocial Distress Thermometer which indicates moderate distress. Clinical Social Worker contacted patient by phone to assess for distress and other psychosocial needs. The patient states she has no concerns at this time.  She did indicate medical bills as her main cause of distress on the distress thermometer.  CSW questioned patient regarding this concern, she states she is receiving many bills and is unsure how she is going to pay them.  CSW encouraged patient to contact billing and have bills compiled into one bill as well as set up payment plan- CSW also informed patient of financial advocate resource.  The patient states she is interested in completing advance directives.  CSW provided patient with CSW [place] phone number and requested patient call SW when ready to complete.   Clinical Social Worker follow up needed: no  If yes, follow up plan:   Kathrin Penner, MSW, LCSW Clinical Social Worker [facility] [phone]

## 2013-09-08 ENCOUNTER — Ambulatory Visit (HOSPITAL_COMMUNITY)
Admission: RE | Admit: 2013-09-08 | Discharge: 2013-09-08 | Disposition: A | Discharge status: Home / Self Care | Payer: BC Managed Care – PPO | Source: Ambulatory Visit | Attending: Oncology | Admitting: Oncology

## 2013-09-08 ENCOUNTER — Encounter (HOSPITAL_COMMUNITY)
Admission: RE | Admit: 2013-09-08 | Discharge: 2013-09-08 | Disposition: A | Discharge status: Home / Self Care | Payer: BC Managed Care – PPO | Source: Ambulatory Visit | Attending: Oncology | Admitting: Oncology

## 2013-09-08 ENCOUNTER — Encounter (HOSPITAL_COMMUNITY): Payer: Self-pay

## 2013-09-08 DIAGNOSIS — C50511 Malignant neoplasm of lower-outer quadrant of right female breast: Secondary | ICD-10-CM

## 2013-09-08 DIAGNOSIS — N289 Disorder of kidney and ureter, unspecified: Secondary | ICD-10-CM | POA: Insufficient documentation

## 2013-09-08 DIAGNOSIS — C50919 Malignant neoplasm of unspecified site of unspecified female breast: Secondary | ICD-10-CM | POA: Insufficient documentation

## 2013-09-08 DIAGNOSIS — N63 Unspecified lump in unspecified breast: Secondary | ICD-10-CM | POA: Insufficient documentation

## 2013-09-08 MED ORDER — FLUDEOXYGLUCOSE F - 18 (FDG) INJECTION
16.3000 | Freq: Once | INTRAVENOUS | Status: AC | PRN
  Administered 2013-09-08: 16.3 via INTRAVENOUS

## 2013-09-08 MED ORDER — IOHEXOL 300 MG/ML  SOLN
100.0000 mL | Freq: Once | INTRAMUSCULAR | Status: AC | PRN
  Administered 2013-09-08: 100 mL via INTRAVENOUS

## 2013-09-11 LAB — GLUCOSE, CAPILLARY: Glucose-Capillary: 91 mg/dL (ref 70–99)

## 2013-09-12 ENCOUNTER — Encounter: Payer: Self-pay | Admitting: Family Medicine

## 2013-09-12 ENCOUNTER — Telehealth: Payer: Self-pay | Admitting: *Deleted

## 2013-09-12 ENCOUNTER — Ambulatory Visit (INDEPENDENT_AMBULATORY_CARE_PROVIDER_SITE_OTHER): Payer: BC Managed Care – PPO | Admitting: Family Medicine

## 2013-09-12 ENCOUNTER — Ambulatory Visit
Admission: RE | Admit: 2013-09-12 | Discharge: 2013-09-12 | Disposition: A | Discharge status: Home / Self Care | Payer: BC Managed Care – PPO | Source: Ambulatory Visit | Attending: Gynecology | Admitting: Gynecology

## 2013-09-12 VITALS — BP 140/80 | HR 100 | Temp 97.3°F | Resp 18 | Wt 211.0 lb

## 2013-09-12 DIAGNOSIS — R928 Other abnormal and inconclusive findings on diagnostic imaging of breast: Secondary | ICD-10-CM

## 2013-09-12 DIAGNOSIS — R8290 Unspecified abnormal findings in urine: Secondary | ICD-10-CM

## 2013-09-12 DIAGNOSIS — R109 Unspecified abdominal pain: Secondary | ICD-10-CM

## 2013-09-12 DIAGNOSIS — R82998 Other abnormal findings in urine: Secondary | ICD-10-CM

## 2013-09-12 LAB — URINALYSIS, ROUTINE W REFLEX MICROSCOPIC
Bilirubin Urine: NEGATIVE
Hgb urine dipstick: NEGATIVE
Ketones, ur: NEGATIVE mg/dL
Nitrite: NEGATIVE
Protein, ur: NEGATIVE mg/dL
Specific Gravity, Urine: 1.015 (ref 1.005–1.030)
Urobilinogen, UA: 0.2 mg/dL (ref 0.0–1.0)
pH: 7.5 (ref 5.0–8.0)

## 2013-09-12 MED ORDER — GADOBENATE DIMEGLUMINE 529 MG/ML IV SOLN
20.0000 mL | Freq: Once | INTRAVENOUS | Status: AC | PRN
  Administered 2013-09-12: 20 mL via INTRAVENOUS

## 2013-09-12 MED ORDER — CIPROFLOXACIN HCL 500 MG PO TABS: 500.0000 mg | ORAL_TABLET | Freq: Two times a day (BID) | ORAL | Status: DC

## 2013-09-12 NOTE — Telephone Encounter (Signed)
Pt called to c/o cloudy urine and pain upon urination.  Pt will not start chemotherapy until next week.  Inform pt to call PCP.  Received verbal understanding.

## 2013-09-12 NOTE — Progress Notes (Signed)
Subjective:    Patient ID: Joan Valdez, female    DOB: Feb 04, 1971, 42 y.o.   MRN: [id number]  HPI Patient reports 2 days of right lower quadrant and left lower quadrant abdominal tenderness which is constant. She has also had several episodes of cloudy urine. She denies fevers. She denies constipation. She denies diarrhea. She denies nausea or vomiting. She denies hematuria or dysuria. She does report frequency. She denies any vaginal bleeding. She denies any vaginal discharge. She has not had sexual activity in a while.  She recently discontinued her birth control pills due to her recent diagnosis of breast cancer.   Past Medical History  Diagnosis Date  . PCO (polycystic ovaries)   . Hypothyroid   . Allergic rhinitis   . Chronic headache   . Acid reflux   . Cervical dysplasia   . Stone, kidney     Hx: of  . Cancer     ;Breast cancer; pre-cancerous cervical cells   Past Surgical History  Procedure Laterality Date  . Cesarean section  2001  . Colposcopy    . Cervical biopsy  w/ loop electrode excision  2002  . Eye surgery      Hx: of metal fragment removed  . Portacath placement N/A 09/04/2013    Procedure: INSERTION PORT-A-CATH;  Surgeon: Robyne Askew, MD;  Location: [facility] OR;  Service: General;  Laterality: N/A;   Current Outpatient Prescriptions on File Prior to Visit  Medication Sig Dispense Refill  . cetirizine (ZYRTEC) 10 MG tablet Take 10 mg by mouth at bedtime.      . chlorpheniramine-HYDROcodone (TUSSIONEX) 10-8 MG/5ML LQCR       . desogestrel-ethinyl estradiol (MIRCETTE) 0.15-0.02/0.01 MG (21/5) tablet Take 1 tablet by mouth daily.      Marland Kitchen dexamethasone (DECADRON) 4 MG tablet Take 2 tablets (8 mg total) by mouth 2 (two) times daily with a meal. Take two times a day the day before Taxotere. Then take two times a day starting the day after chemo for 3 days.  30 tablet  1  . dexlansoprazole (DEXILANT) 60 MG capsule Take 60 mg by mouth daily.      Marland Kitchen dihydroergotamine  (MIGRANAL) 4 MG/ML nasal spray Place 1 spray into the nose as needed for migraine. Use in one nostril as directed.  No more than 4 sprays in one hour      . Eflornithine HCl (VANIQA) 13.9 % cream Apply pea-sized amount as directed  30 g  5  . ibuprofen (ADVIL,MOTRIN) 200 MG tablet Take 400 mg by mouth every 8 (eight) hours as needed. For pain      . levofloxacin (LEVAQUIN) 500 MG tablet       . levothyroxine (SYNTHROID, LEVOTHROID) 100 MCG tablet Take 100 mcg by mouth daily.       Marland Kitchen lidocaine-prilocaine (EMLA) cream Apply topically as needed.  30 g  6  . liothyronine (CYTOMEL) 25 MCG tablet Take 12.5 mcg by mouth 2 (two) times daily.       Marland Kitchen LORazepam (ATIVAN) 0.5 MG tablet Take 1 tablet (0.5 mg total) by mouth every 6 (six) hours as needed (Nausea or vomiting).  30 tablet  0  . methylPREDNIsolone (MEDROL DOSPACK) 4 MG tablet       . mometasone (NASONEX) 50 MCG/ACT nasal spray Place 2 sprays into both nostrils daily.       . Nutritional Supplements (JUICE PLUS FIBRE PO) Take 1 tablet by mouth daily.      Marland Kitchen  ondansetron (ZOFRAN) 8 MG tablet Take 1 tablet (8 mg total) by mouth 2 (two) times daily. Take two times a day starting the day after chemo for 3 days. Then take two times a day as needed for nausea or vomiting.  30 tablet  1  . oseltamivir (TAMIFLU) 75 MG capsule Take 1 capsule (75 mg total) by mouth daily.  10 capsule  0  . oxyCODONE-acetaminophen (ROXICET) 5-325 MG per tablet Take 1-2 tablets by mouth every 4 (four) hours as needed for severe pain.  30 tablet  0  . prochlorperazine (COMPAZINE) 10 MG tablet Take 1 tablet (10 mg total) by mouth every 6 (six) hours as needed (Nausea or vomiting).  30 tablet  1  . ranitidine (ZANTAC) 300 MG tablet Take 300 mg by mouth at bedtime.      . topiramate (TOPAMAX) 100 MG tablet Take 200 mg by mouth at bedtime.       No current facility-administered medications on file prior to visit.   Allergies  Allergen Reactions  . Imitrex [Sumatriptan Base]  Anaphylaxis  . Amoxicillin Hives  . Bactrim Hives  . Cephalexin Hives  . Erythromycin Hives  . Penicillins Hives  . Strawberry Hives  . Sulfa Antibiotics Hives  . Zithromax [Azithromycin Dihydrate] Hives   History   Social History  . Marital Status: Married    Spouse Name: N/A    Number of Children: Y  . Years of Education: N/A   Occupational History  . teacher    Social History Main Topics  . Smoking status: Never Smoker   . Smokeless tobacco: Never Used  . Alcohol Use: 1.0 oz/week    2 drink(s) per week     Comment: socially.  . Drug Use: No  . Sexual Activity: Yes    Birth Control/ Protection: Pill   Other Topics Concern  . Not on file   Social History Narrative  . No narrative on file      Review of Systems  All other systems reviewed and are negative.       Objective:   Physical Exam  Vitals reviewed. Cardiovascular: Normal rate, regular rhythm, normal heart sounds and intact distal pulses.   No murmur heard. Pulmonary/Chest: Effort normal and breath sounds normal. No respiratory distress. She has no wheezes. She has no rales.  Abdominal: Soft. Bowel sounds are normal. She exhibits no distension and no mass. There is tenderness. There is no rebound and no guarding.   patient has mild tenderness to palpation in the left lower quadrant and right lower quadrant of the abdomen. She is also tender to palpation over the bladder.  There is no guarding. There is no rebound. There is no evidence of acute abd. Office Visit on 09/12/2013  Component Date Value Range Status  . Color, Urine 09/12/2013 YELLOW  YELLOW Final  . APPearance 09/12/2013 CLEAR  CLEAR Final  . Specific Gravity, Urine 09/12/2013 1.015  1.005 - 1.030 Final  . pH 09/12/2013 7.5  5.0 - 8.0 Final  . Glucose, UA 09/12/2013 NEG  NEG mg/dL Final  . Bilirubin Urine 09/12/2013 NEG  NEG Final  . Ketones, ur 09/12/2013 NEG  NEG mg/dL Final  . Hgb urine dipstick 09/12/2013 NEG  NEG Final  . Protein,  ur 09/12/2013 NEG  NEG mg/dL Final  . Urobilinogen, UA 09/12/2013 0.2  0.0 - 1.0 mg/dL Final  . Nitrite 16/06/9603 NEG  NEG Final  . Leukocytes, UA 09/12/2013 NEG  NEG Final  Assessment & Plan:  1. Cloudy urine - Urinalysis, Routine w reflex microscopic - ciprofloxacin (CIPRO) 500 MG tablet; Take 1 tablet (500 mg total) by mouth 2 (two) times daily.  Dispense: 10 tablet; Refill: 0  2. Abdominal  pain, other specified site Urinalysis is benign. However the patient has had several episodes of cloudy urine and is tender over bladder. She also experienced abdominal discomfort when she voided.  I am concerned she may have a urinary tract infection. I do not feel that this is coming from her ovaries or uterus given the lack of vaginal bleeding, her lack of sexual activity(exposure to GC/CT/PID) and her lack of vaginal discharge. If the pain intensifies I would proceed with a CT scan of the abdomen and pelvis to rule out appendicitis or other causes of acute abdomen. Meanwhile treat the patient empirically with Cipro 500 mg by mouth twice a day for 5 days and send a urine culture. Also recommended the patient begin MiraLax daily in case the narcotic pain medication is causing constipation which may be contributing to her abdominal discomfort. Recheck in 48 hrs or sooner if worse.

## 2013-09-12 NOTE — Addendum Note (Signed)
Addended by: WRAY, [place] on: 09/12/2013 04:06 PM   Modules accepted: Orders

## 2013-09-13 ENCOUNTER — Ambulatory Visit: Payer: BC Managed Care – PPO

## 2013-09-13 ENCOUNTER — Ambulatory Visit: Payer: BC Managed Care – PPO | Admitting: Radiation Oncology

## 2013-09-14 LAB — URINE CULTURE
Colony Count: NO GROWTH
Organism ID, Bacteria: NO GROWTH

## 2013-09-15 ENCOUNTER — Other Ambulatory Visit: Payer: Self-pay | Admitting: *Deleted

## 2013-09-15 ENCOUNTER — Ambulatory Visit (HOSPITAL_COMMUNITY): Payer: BC Managed Care – PPO | Attending: Cardiology | Admitting: Radiology

## 2013-09-15 ENCOUNTER — Encounter: Payer: Self-pay | Admitting: Cardiology

## 2013-09-15 DIAGNOSIS — I428 Other cardiomyopathies: Secondary | ICD-10-CM

## 2013-09-15 DIAGNOSIS — C50919 Malignant neoplasm of unspecified site of unspecified female breast: Secondary | ICD-10-CM

## 2013-09-15 DIAGNOSIS — C50511 Malignant neoplasm of lower-outer quadrant of right female breast: Secondary | ICD-10-CM

## 2013-09-15 DIAGNOSIS — Z5111 Encounter for antineoplastic chemotherapy: Secondary | ICD-10-CM

## 2013-09-15 MED ORDER — PERFLUTREN PROTEIN A MICROSPH IV SUSP: 1.5000 mL | Freq: Once | INTRAVENOUS | Status: DC

## 2013-09-15 MED ORDER — PERFLUTREN PROTEIN A MICROSPH IV SUSP
1.5000 mL | Freq: Once | INTRAVENOUS | Status: AC
  Administered 2013-09-15: 1.5 mL via INTRAVENOUS

## 2013-09-15 NOTE — Progress Notes (Signed)
optison given per request of echo tech for imaging.  Iv/ 22g removed without incident from right AC/ iv cath intact with removal, pressure held, gauze and tape used for compression. No bruising at time dc. No complications post. Pt was left with echo tech.

## 2013-09-15 NOTE — Progress Notes (Signed)
Echocardiogram performed with Optison.  

## 2013-09-19 ENCOUNTER — Ambulatory Visit (HOSPITAL_BASED_OUTPATIENT_CLINIC_OR_DEPARTMENT_OTHER): Payer: BC Managed Care – PPO | Admitting: Oncology

## 2013-09-19 ENCOUNTER — Other Ambulatory Visit (HOSPITAL_BASED_OUTPATIENT_CLINIC_OR_DEPARTMENT_OTHER): Payer: BC Managed Care – PPO

## 2013-09-19 ENCOUNTER — Encounter: Payer: Self-pay | Admitting: Oncology

## 2013-09-19 ENCOUNTER — Encounter: Payer: Self-pay | Admitting: *Deleted

## 2013-09-19 ENCOUNTER — Ambulatory Visit (HOSPITAL_BASED_OUTPATIENT_CLINIC_OR_DEPARTMENT_OTHER): Payer: BC Managed Care – PPO

## 2013-09-19 VITALS — BP 136/77 | HR 97 | Temp 98.3°F | Resp 18

## 2013-09-19 VITALS — BP 151/83 | HR 106 | Temp 98.1°F | Resp 18 | Ht 62.0 in | Wt 211.7 lb

## 2013-09-19 DIAGNOSIS — N920 Excessive and frequent menstruation with regular cycle: Secondary | ICD-10-CM

## 2013-09-19 DIAGNOSIS — C50511 Malignant neoplasm of lower-outer quadrant of right female breast: Secondary | ICD-10-CM

## 2013-09-19 DIAGNOSIS — C50519 Malignant neoplasm of lower-outer quadrant of unspecified female breast: Secondary | ICD-10-CM

## 2013-09-19 DIAGNOSIS — C773 Secondary and unspecified malignant neoplasm of axilla and upper limb lymph nodes: Secondary | ICD-10-CM

## 2013-09-19 DIAGNOSIS — Z5111 Encounter for antineoplastic chemotherapy: Secondary | ICD-10-CM

## 2013-09-19 DIAGNOSIS — Z5112 Encounter for antineoplastic immunotherapy: Secondary | ICD-10-CM

## 2013-09-19 DIAGNOSIS — Z171 Estrogen receptor negative status [ER-]: Secondary | ICD-10-CM

## 2013-09-19 DIAGNOSIS — R109 Unspecified abdominal pain: Secondary | ICD-10-CM

## 2013-09-19 LAB — COMPREHENSIVE METABOLIC PANEL (CC13)
ALK PHOS: 97 U/L (ref 40–150)
ALT: 21 U/L (ref 0–55)
AST: 9 U/L (ref 5–34)
Albumin: 3.7 g/dL (ref 3.5–5.0)
Anion Gap: 10 mEq/L (ref 3–11)
BILIRUBIN TOTAL: 0.29 mg/dL (ref 0.20–1.20)
BUN: 14.2 mg/dL (ref 7.0–26.0)
CO2: 18 mEq/L — ABNORMAL LOW (ref 22–29)
Calcium: 9.8 mg/dL (ref 8.4–10.4)
Chloride: 109 mEq/L (ref 98–109)
Creatinine: 0.7 mg/dL (ref 0.6–1.1)
Glucose: 188 mg/dl — ABNORMAL HIGH (ref 70–140)
POTASSIUM: 3.8 meq/L (ref 3.5–5.1)
Sodium: 137 mEq/L (ref 136–145)
TOTAL PROTEIN: 7.7 g/dL (ref 6.4–8.3)

## 2013-09-19 LAB — CBC WITH DIFFERENTIAL/PLATELET
BASO%: 0 % (ref 0.0–2.0)
Basophils Absolute: 0 10*3/uL (ref 0.0–0.1)
EOS ABS: 0 10*3/uL (ref 0.0–0.5)
EOS%: 0 % (ref 0.0–7.0)
HEMATOCRIT: 46.4 % (ref 34.8–46.6)
HEMOGLOBIN: 16.4 g/dL — AB (ref 11.6–15.9)
LYMPH#: 1.4 10*3/uL (ref 0.9–3.3)
LYMPH%: 6.2 % — ABNORMAL LOW (ref 14.0–49.7)
MCH: 30.5 pg (ref 25.1–34.0)
MCHC: 35.3 g/dL (ref 31.5–36.0)
MCV: 86.2 fL (ref 79.5–101.0)
MONO#: 0.2 10*3/uL (ref 0.1–0.9)
MONO%: 0.8 % (ref 0.0–14.0)
NEUT%: 93 % — AB (ref 38.4–76.8)
NEUTROS ABS: 20.6 10*3/uL — AB (ref 1.5–6.5)
Platelets: 230 10*3/uL (ref 145–400)
RBC: 5.38 10*6/uL (ref 3.70–5.45)
RDW: 12.9 % (ref 11.2–14.5)
WBC: 22.2 10*3/uL — ABNORMAL HIGH (ref 3.9–10.3)

## 2013-09-19 MED ORDER — DIPHENHYDRAMINE HCL 25 MG PO CAPS
50.0000 mg | ORAL_CAPSULE | Freq: Once | ORAL | Status: AC
  Administered 2013-09-19: 50 mg via ORAL

## 2013-09-19 MED ORDER — SODIUM CHLORIDE 0.9 % IV SOLN
Freq: Once | INTRAVENOUS | Status: AC
  Administered 2013-09-19: 12:00:00 via INTRAVENOUS

## 2013-09-19 MED ORDER — ONDANSETRON 16 MG/50ML IVPB (CHCC)
INTRAVENOUS | Status: AC
  Filled 2013-09-19: qty 16

## 2013-09-19 MED ORDER — SODIUM CHLORIDE 0.9 % IV SOLN
75.0000 mg/m2 | Freq: Once | INTRAVENOUS | Status: AC
  Administered 2013-09-19: 150 mg via INTRAVENOUS
  Filled 2013-09-19: qty 15

## 2013-09-19 MED ORDER — PERTUZUMAB CHEMO INJECTION 420 MG/14ML
840.0000 mg | Freq: Once | INTRAVENOUS | Status: AC
  Administered 2013-09-19: 840 mg via INTRAVENOUS
  Filled 2013-09-19: qty 28

## 2013-09-19 MED ORDER — ACETAMINOPHEN 325 MG PO TABS
650.0000 mg | ORAL_TABLET | Freq: Once | ORAL | Status: AC
  Administered 2013-09-19: 650 mg via ORAL

## 2013-09-19 MED ORDER — HEPARIN SOD (PORK) LOCK FLUSH 100 UNIT/ML IV SOLN
500.0000 [IU] | Freq: Once | INTRAVENOUS | Status: AC | PRN
  Administered 2013-09-19: 500 [IU]
  Filled 2013-09-19: qty 5

## 2013-09-19 MED ORDER — DIPHENHYDRAMINE HCL 25 MG PO CAPS
ORAL_CAPSULE | ORAL | Status: AC
  Filled 2013-09-19: qty 2

## 2013-09-19 MED ORDER — ONDANSETRON 16 MG/50ML IVPB (CHCC)
16.0000 mg | Freq: Once | INTRAVENOUS | Status: AC
  Administered 2013-09-19: 16 mg via INTRAVENOUS

## 2013-09-19 MED ORDER — SODIUM CHLORIDE 0.9 % IV SOLN
900.0000 mg | Freq: Once | INTRAVENOUS | Status: AC
  Administered 2013-09-19: 900 mg via INTRAVENOUS
  Filled 2013-09-19: qty 90

## 2013-09-19 MED ORDER — TRASTUZUMAB CHEMO INJECTION 440 MG
8.0000 mg/kg | Freq: Once | INTRAVENOUS | Status: AC
  Administered 2013-09-19: 777 mg via INTRAVENOUS
  Filled 2013-09-19: qty 37

## 2013-09-19 MED ORDER — SODIUM CHLORIDE 0.9 % IJ SOLN
10.0000 mL | INTRAMUSCULAR | Status: DC | PRN
  Administered 2013-09-19: 10 mL
  Filled 2013-09-19: qty 10

## 2013-09-19 MED ORDER — DEXAMETHASONE SODIUM PHOSPHATE 20 MG/5ML IJ SOLN
20.0000 mg | Freq: Once | INTRAMUSCULAR | Status: AC
  Administered 2013-09-19: 20 mg via INTRAVENOUS

## 2013-09-19 MED ORDER — ACETAMINOPHEN 325 MG PO TABS
ORAL_TABLET | ORAL | Status: AC
  Filled 2013-09-19: qty 2

## 2013-09-19 MED ORDER — DEXAMETHASONE SODIUM PHOSPHATE 20 MG/5ML IJ SOLN
INTRAMUSCULAR | Status: AC
  Filled 2013-09-19: qty 5

## 2013-09-19 NOTE — Progress Notes (Signed)
Joan Valdez [id number] 43 y.o. 09/19/2013 10:59 AM  CC  [name], MD [address] [place] [address] Dr. Eddie Dibbles toth  Diagnosis:  43 year old female with new diagnosis of triple positive invasive ductal carcinoma of the right breast diagnosed 08/16/2013  STAGE:  Breast cancer of lower-outer quadrant of right female breast   Primary site: Breast (Right)   Staging method: AJCC 7th Edition   Clinical: Stage IIIA (T3, N1, cM0) signed by Deatra Robinson, MD on 09/21/2013  6:07 PM   Summary: Stage IIIA (T3, N1, cM0)  REFERRING PHYSICIAN: Dr. Autumn Messing  Past oncologic hisotry:  Joan Valdez is a 43 y.o. female.  Would medical history significant for hypothyroidism gastroesophageal reflux disease and migraine headaches.patient had a routine screening mammogram performed she was noted to have a large mass in the 6:00 position of the right breast measuring 3 cm for the primary mass with another 2 cm of calcifications anteriorly. She was also noted to have a enlarged lymph node in the right axilla. Patient underwent a needle core biopsy of the primary mass as well as axilla. The tumor was ER positive PR positive HER-2 positive with a proliferation marker Ki-67 59%. Patient had MRI of the breasts performed MRI showed right mass to measure 8.2 x 5.6 x 3.1 cm lymph node measured 2.5 cm on the left breast there was a 9 millimeter area of concern.  Current therapy: TCH/perjeta cycle 1 day 1  Interval history: Patient is seen in followup today to begin cycle 1 day 1 of chemotherapy. Overall she seems to be doing quite well. We discussed all of her staging studies and her scans as well as her echocardiogram. She does have a appointment with cardiology. Patient is denying any headaches double vision blurring of vision fevers chills night sweats shortness of breath chest pains or palpitations no myalgias and arthralgias. Remainder of the 10 point review of systems is  negative.  Past Medical History: Past Medical History  Diagnosis Date  . PCO (polycystic ovaries)   . Hypothyroid   . Allergic rhinitis   . Chronic headache   . Acid reflux   . Cervical dysplasia   . Stone, kidney     Hx: of  . Cancer     ;Breast cancer; pre-cancerous cervical cells    Past Surgical History: Past Surgical History  Procedure Laterality Date  . Cesarean section  2001  . Colposcopy    . Cervical biopsy  w/ loop electrode excision  2002  . Eye surgery      Hx: of metal fragment removed  . Portacath placement N/A 09/04/2013    Procedure: INSERTION PORT-A-CATH;  Surgeon: Merrie Roof, MD;  Location: [facility] OR;  Service: General;  Laterality: N/A;    Family History: Family History  Problem Relation Age of Onset  . Hypertension Mother   . Migraines Mother   . Diabetes Paternal Grandmother   . Hypertension Paternal Grandmother   . Rheum arthritis Paternal Grandmother   . Heart disease Maternal Grandmother     Social History History  Substance Use Topics  . Smoking status: Never Smoker   . Smokeless tobacco: Never Used  . Alcohol Use: 1.0 oz/week    2 drink(s) per week     Comment: socially.    Allergies: Allergies  Allergen Reactions  . Imitrex [Sumatriptan Base] Anaphylaxis  . Amoxicillin Hives  . Bactrim Hives  . Cephalexin Hives  . Erythromycin Hives  . Penicillins  Hives  . Strawberry Hives  . Sulfa Antibiotics Hives  . Zithromax [Azithromycin Dihydrate] Hives    Current Medications: Current Outpatient Prescriptions  Medication Sig Dispense Refill  . cetirizine (ZYRTEC) 10 MG tablet Take 10 mg by mouth at bedtime.      Marland Kitchen dexamethasone (DECADRON) 4 MG tablet Take 2 tablets (8 mg total) by mouth 2 (two) times daily with a meal. Take two times a day the day before Taxotere. Then take two times a day starting the day after chemo for 3 days.  30 tablet  1  . dexlansoprazole (DEXILANT) 60 MG capsule Take 60 mg by mouth daily.      .  Eflornithine HCl (VANIQA) 13.9 % cream Apply pea-sized amount as directed  30 g  5  . ibuprofen (ADVIL,MOTRIN) 200 MG tablet Take 400 mg by mouth every 8 (eight) hours as needed. For pain      . levothyroxine (SYNTHROID, LEVOTHROID) 100 MCG tablet Take 100 mcg by mouth daily.       Marland Kitchen lidocaine-prilocaine (EMLA) cream Apply topically as needed.  30 g  6  . liothyronine (CYTOMEL) 25 MCG tablet Take 12.5 mcg by mouth 2 (two) times daily.       . mometasone (NASONEX) 50 MCG/ACT nasal spray Place 2 sprays into both nostrils daily.       . Nutritional Supplements (JUICE PLUS FIBRE PO) Take 1 tablet by mouth daily.      . ranitidine (ZANTAC) 300 MG tablet Take 300 mg by mouth at bedtime.      . topiramate (TOPAMAX) 100 MG tablet Take 200 mg by mouth at bedtime.      . dihydroergotamine (MIGRANAL) 4 MG/ML nasal spray Place 1 spray into the nose as needed for migraine. Use in one nostril as directed.  No more than 4 sprays in one hour      . LORazepam (ATIVAN) 0.5 MG tablet Take 1 tablet (0.5 mg total) by mouth every 6 (six) hours as needed (Nausea or vomiting).  30 tablet  0  . ondansetron (ZOFRAN) 8 MG tablet Take 1 tablet (8 mg total) by mouth 2 (two) times daily. Take two times a day starting the day after chemo for 3 days. Then take two times a day as needed for nausea or vomiting.  30 tablet  1  . oxyCODONE-acetaminophen (ROXICET) 5-325 MG per tablet Take 1-2 tablets by mouth every 4 (four) hours as needed for severe pain.  30 tablet  0  . prochlorperazine (COMPAZINE) 10 MG tablet Take 1 tablet (10 mg total) by mouth every 6 (six) hours as needed (Nausea or vomiting).  30 tablet  1   No current facility-administered medications for this visit.    OB/GYN History:menarche at age 4 she is premenopausal last menstrual period was on 08/11/2013. First live birth at 61.  Fertility Discussion:patient has completed her family Prior History of Cancer: no  Health Maintenance:  Colonoscopy no Bone  Densityno Last PAP smearup-to-date  ECOG PERFORMANCE STATUS: 0 - Asymptomatic  Genetic Counseling/testing: yes  REVIEW OF SYSTEMS:  A comprehensive review of systems was negative.  PHYSICAL EXAMINATION: Blood pressure 151/83, pulse 106, temperature 98.1 F (36.7 C), temperature source Oral, resp. rate 18, height 5' 2" (1.575 m), weight 211 lb 11.2 oz (96.026 kg), last menstrual period 09/06/2013.  JJH:ERDEY, healthy, no distress, well nourished and well developed SKIN: skin color, texture, turgor are normal HEAD: Normocephalic EYES: PERRLA, EOMI, Conjunctiva are pink and non-injected EARS: External ears normal  OROPHARYNX:no exudate and lips, buccal mucosa, and tongue normal  NECK: no adenopathy LYMPH:  no hepatosplenomegaly BREAST:left breast normal without mass, skin or nipple changes or axillary nodes, right breast and right axilla palpable masses there is erythema ecchymosis secondary to the biopsy LUNGS: clear to auscultation and percussion HEART: regular rate & rhythm ABDOMEN:abdomen soft, non-tender, normal bowel sounds and no masses or organomegaly BACK: No CVA tenderness EXTREMITIES:less then 2 second capillary refill, no edema, no clubbing, no cyanosis  NEURO: alert & oriented x 3 with fluent speech, no focal motor/sensory deficits, gait normal     STUDIES/RESULTS: US Breast Right  08/16/2013   CLINICAL DATA:  Patient returns for evaluation of a mass in the right breast noted on recent screening study dated 07/19/2013.  EXAM: DIGITAL DIAGNOSTIC  RIGHT LIMITED MAMMOGRAM  ULTRASOUND RIGHT BREAST  COMPARISON:  10/21/2007  ACR Breast Density Category b: There are scattered areas of fibroglandular density.  FINDINGS: Additional views confirm the presence of a spiculated mass with microcalcifications in the 6 o'clock position of the right breast posteriorly. This measures at least 4.2 x 3.9 x 2.8 cm mammographically. There are highly suspicious pleomorphic microcalcifications  both within the mass and extending anteriorly from the mass towards the nipple. In addition, there are grouped calcifications superior and lateral to the mass which cover a length of approximately 18 mm.  On physical exam, I palpate a 3-4 cm mass at 6 o'clock 5 cm from the right nipple.  Ultrasound is performed, showing an irregular hypoechoic mass with microcalcifications at 6 o'clock 5 cm from the right nipple. This measures at least 2.0 x 1.9 x 3.0 cm. It has a hyperechoic halo. There is a hypoechoic tubular structure with microcalcifications extending a length of 1.7 cm from the superior margin the mass towards the nipple. Sonography of the right axilla demonstrates a lymph node with a markedly thickened cortex.  The appearance is highly suspicious for invasive mammary carcinoma with associated ductal carcinoma and axillary metastasis. Ultrasound-guided core needle biopsy of the mass is suggested today. If carcinoma is confirmed, MRI could be performed to assess for any additional areas that may need to be biopsied if breast conservation is desired.  IMPRESSION: Highly suspicious mass with microcalcifications an abnormal right axillary lymph node as described above.  RECOMMENDATION: Ultrasound-guided core needle biopsy of the mass is recommended and will be performed and reported separately.  I have discussed the findings and recommendations with the patient. Results were also provided in writing at the conclusion of the visit. If applicable, a reminder letter will be sent to the patient regarding the next appointment.  Report was telephoned to [facility] at Dr. Zelphia Cairo office.  BI-RADS CATEGORY  5: Highly suggestive of malignancy - appropriate action should be taken.   Electronically Signed   By: Ulyess Blossom M.D.   On: 08/16/2013 10:41   Mr Breast Bilateral W Wo Contrast  08/25/2013   CLINICAL DATA:  43 year old female with recently diagnosed invasive ductal carcinoma of the right breast with right  axillary lymph node metastases.  EXAM: BILATERAL BREAST MRI WITH AND WITHOUT CONTRAST  LABS:  No recent labs.  TECHNIQUE: Multiplanar, multisequence MR images of both breasts were obtained prior to and following the intravenous administration of 12m of MultiHance.  THREE-DIMENSIONAL MR IMAGE RENDERING ON INDEPENDENT WORKSTATION:  Three-dimensional MR images were rendered by post-processing of the original MR data on an independent workstation. The three-dimensional MR images were interpreted, and findings are reported in the following complete  MRI report for this study. Three dimensional images were evaluated at the independent DynaCad workstation  COMPARISON:  Previous exams  FINDINGS: Breast composition: c:  Heterogeneous fibroglandular tissue  Background parenchymal enhancement: Mild.  Right breast: Large irregular spiculated mass in the lower outer quadrant of the right breast with contiguous linear clumped non mass enhancement extending posteriorly consistent with known biopsy proven malignancy measures approximately 8.2 cm AP, 5.6 cm transverse, and 3.1 cm craniocaudal. Biopsy clip artifact is present along the inferior margin of the dominant mass. A probable satellite nodule just superior to the dominant mass measures approximately 6 mm. No findings to suggest skin, nipple, or pectoralis muscle involvement.  Left breast: There is linear non mass enhancement in the inferior left breast at the approximate 6 o'clock position mid to posterior depth measuring approximately 9 mm AP. No abnormal skin, nipple, or pectoralis enhancement.  Lymph nodes: Enlarged and morphologically abnormal lymph node in the right axilla measures 2.5 x 1.2 cm, compatible with known right axillary nodal metastases. No internal mammary lymphadenopathy seen.  Ancillary findings:  None.  IMPRESSION: 1. Biopsy proven malignancy in the lower outer right breast with contiguous linear clumped enhancement extending posteriorly and a probable  small satellite nodule present superiorly, with total extent of disease measuring up to 8.2 cm AP. Morphologically abnormal lymph node in the right axilla, compatible with biopsy proven nodal metastases.  2. Indeterminate linear non mass enhancement in the inferior left breast at the approximate 6 o'clock position mid to posterior depth measuring approximately 9 mm.  RECOMMENDATION: 1. MRI guided biopsy of the indeterminate linear non mass enhancement in the inferior left breast is recommended.  2.  Treatment plan for known right breast malignancy.  BI-RADS CATEGORY  4: Suspicious abnormality - biopsy should be considered.   Electronically Signed   By: Everlean Alstrom M.D.   On: 08/25/2013 14:02   Mm [place] R  08/16/2013   CLINICAL DATA:  Patient returns for evaluation of a mass in the right breast noted on recent screening study dated 07/19/2013.  EXAM: DIGITAL DIAGNOSTIC  RIGHT LIMITED MAMMOGRAM  ULTRASOUND RIGHT BREAST  COMPARISON:  10/21/2007  ACR Breast Density Category b: There are scattered areas of fibroglandular density.  FINDINGS: Additional views confirm the presence of a spiculated mass with microcalcifications in the 6 o'clock position of the right breast posteriorly. This measures at least 4.2 x 3.9 x 2.8 cm mammographically. There are highly suspicious pleomorphic microcalcifications both within the mass and extending anteriorly from the mass towards the nipple. In addition, there are grouped calcifications superior and lateral to the mass which cover a length of approximately 18 mm.  On physical exam, I palpate a 3-4 cm mass at 6 o'clock 5 cm from the right nipple.  Ultrasound is performed, showing an irregular hypoechoic mass with microcalcifications at 6 o'clock 5 cm from the right nipple. This measures at least 2.0 x 1.9 x 3.0 cm. It has a hyperechoic halo. There is a hypoechoic tubular structure with microcalcifications extending a length of 1.7 cm from the superior margin the mass  towards the nipple. Sonography of the right axilla demonstrates a lymph node with a markedly thickened cortex.  The appearance is highly suspicious for invasive mammary carcinoma with associated ductal carcinoma and axillary metastasis. Ultrasound-guided core needle biopsy of the mass is suggested today. If carcinoma is confirmed, MRI could be performed to assess for any additional areas that may need to be biopsied if breast conservation is desired.  IMPRESSION: Highly suspicious mass with microcalcifications an abnormal right axillary lymph node as described above.  RECOMMENDATION: Ultrasound-guided core needle biopsy of the mass is recommended and will be performed and reported separately.  I have discussed the findings and recommendations with the patient. Results were also provided in writing at the conclusion of the visit. If applicable, a reminder letter will be sent to the patient regarding the next appointment.  Report was telephoned to [facility] at Dr. Zelphia Cairo office.  BI-RADS CATEGORY  5: Highly suggestive of malignancy - appropriate action should be taken.   Electronically Signed   By: Ulyess Blossom M.D.   On: 08/16/2013 10:41   Mm Digital Diagnostic Unilat R  08/16/2013   CLINICAL DATA:  Ultrasound-guided core needle biopsy of a mass at 6 o'clock 5 cm from the right nipple with clip placement.  EXAM: DIAGNOSTIC RIGHT MAMMOGRAM POST ULTRASOUND BIOPSY  COMPARISON:  Previous exams  FINDINGS: Mammographic images were obtained following ultrasound guided biopsy of a mass at 6 o'clock 5 cm from the right nipple. The coil clip is appropriately positioned within the mass.  IMPRESSION: Appropriate clip placement following ultrasound-guided core needle biopsy of the mass at 6 o'clock 5 cm from the right nipple.  Final Assessment: Post Procedure Mammograms for Marker Placement   Electronically Signed   By: Ulyess Blossom M.D.   On: 08/16/2013 10:48   Mm Radiologist Eval And Mgmt  08/17/2013   EXAM:  ESTABLISHED PATIENT OFFICE VISIT -LEVEL II [phone])  HISTORY OF PRESENT ILLNESS: Spiculated mass in the right breast at 6 o'clock biopsied with ultrasound guidance on 08/15/2013  CHIEF COMPLAINT: The patient returns to obtain pathology results for ultrasound-guided biopsy of right breast mass and abnormal right lymph node performed on 08/15/2013  PHYSICAL EXAMINATION: Both biopsy sites are dry with no evidence for seepage or signs of infection. A small area of bruising is present associated with the biopsy site for the mass and the patient reports some discomfort with this biopsy. No discomfort is present associated with the biopsy site of the lymph node.  ASSESSMENT AND PLAN: The patient has been scheduled for surgical followup with Dr. Marlou Starks on 08/24/2013 and MRI of the breast on 08/25/2013. All of the patient's questions were answered.  CONSULTATION: PATHOLOGY: Biopsy of the mass reveals grade 2 invasive ductal carcinoma and ductal carcinoma in situ with necrosis. Biopsy of the lymph node reveals almost complete involvement with carcinoma.  CONCORDANT:  Yes  RECOMMENDATION: Surgical consultation and MRI of the breast as scheduled above   Electronically Signed   By: Willadean Carol M.D.   On: 08/17/2013 17:19   [place] Rt Breast Bx W Loc Dev 1st Lesion Img Bx Spec US Guide  08/16/2013   CLINICAL DATA:  Spiculated mass with microcalcifications at 6 o'clock 5 cm from the right nipple.  EXAM: ULTRASOUND GUIDED RIGHT BREAST CORE NEEDLE BIOPSY WITH VACUUM ASSIST  COMPARISON:  Previous exams.  PROCEDURE: I met with the patient and we discussed the procedure of ultrasound-guided biopsy, including benefits and alternatives. We discussed the high likelihood of a successful procedure. We discussed the risks of the procedure including infection, bleeding, tissue injury, clip migration, and inadequate sampling. Informed written consent was given. The usual time-out protocol was performed immediately prior to the procedure.  Using  sterile technique, 2% lidocaine for local anesthesia, and ultrasound guidance, a 12 gauge vacuum-assisteddevice was used to perform biopsy of the mass at 6 o'clock in the right breast using a College cranial approach.  At the conclusion of the procedure, a coil tissue marker clip was deployed into the biopsy cavity. Follow-up 2-view mammogram was performed and dictated separately.  IMPRESSION: Ultrasound-guided biopsy of a spiculated mass with microcalcifications at 6 o'clock 5 cm from the right nipple. No apparent complications.   Electronically Signed   By: Ulyess Blossom M.D.   On: 08/16/2013 10:46   [place] Rt Breast Bx W Loc Dev Ea Add Lesion Img Bx Spec US Guide  08/16/2013   CLINICAL DATA:  Abnormal right axillary lymph node.  EXAM: ULTRASOUND GUIDED RIGHT AXILLA CORE NEEDLE BIOPSY  COMPARISON:  Previous exams.  FINDINGS: I met with the patient and we discussed the procedure of ultrasound-guided biopsy, including benefits and alternatives. We discussed the high likelihood of a successful procedure. We discussed the risks of the procedure, including infection, bleeding, tissue injury, clip migration, and inadequate sampling. Informed written consent was given. The usual time-out protocol was performed immediately prior to the procedure.  Using sterile technique, 2% Lidocaine as local anesthetic, and ultrasound guidance, a 14 gauge spring-loadeddevice was used to perform biopsy of the abnormal right axillary lymph node using an oblique lateromedial approach.  IMPRESSION: Ultrasound guided biopsy of an abnormal right axillary lymph node. No apparent complications.   Electronically Signed   By: Ulyess Blossom M.D.   On: 08/16/2013 10:45     LABS:    Chemistry      Component Value Date/Time   NA 138 08/30/2013 0916   NA 139 08/29/2013 0842   K 4.0 08/30/2013 0916   K 3.9 08/29/2013 0842   CL 105 08/30/2013 0916   CO2 21 08/30/2013 0916   CO2 20* 08/29/2013 0842   BUN 10 08/30/2013 0916   BUN 10.0  08/29/2013 0842   CREATININE 0.60 08/30/2013 0916   CREATININE 0.8 08/29/2013 0842      Component Value Date/Time   CALCIUM 9.3 08/30/2013 0916   CALCIUM 9.4 08/29/2013 0842   ALKPHOS 90 08/29/2013 0842   AST 11 08/29/2013 0842   ALT 12 08/29/2013 0842   BILITOT 0.50 08/29/2013 0842      Lab Results  Component Value Date   WBC 22.2* 09/19/2013   HGB 16.4* 09/19/2013   HCT 46.4 09/19/2013   MCV 86.2 09/19/2013   PLT 230 09/19/2013       PATHOLOGY: ADDITIONAL INFORMATION: 1. PROGNOSTIC INDICATORS - ACIS Results: IMMUNOHISTOCHEMICAL AND MORPHOMETRIC ANALYSIS BY THE AUTOMATED CELLULAR IMAGING SYSTEM (ACIS) Estrogen Receptor: 100%, POSITIVE, STRONG STAINING INTENSITY Progesterone Receptor: 100%, POSITIVE, STRONG STAINING INTENSITY Proliferation Marker Ki67: 72% REFERENCE RANGE ESTROGEN RECEPTOR NEGATIVE <1% POSITIVE =>1% PROGESTERONE RECEPTOR NEGATIVE <1% POSITIVE =>1% All controls stained appropriately Joan Cutter MD Pathologist, Electronic Signature ( Signed 08/23/2013) 1. CHROMOGENIC IN-SITU HYBRIDIZATION Results: HER2/NEU BY CISH - SHOWS AMPLIFICATION BY CISH ANALYSIS. RESULT RATIO OF HER2: CEP 17 SIGNALS 5.77 AVERAGE HER2 COPY NUMBER PER CELL 7.50 REFERENCE RANGE 1 of 4 FINAL for Joan Valdez, Joan Valdez ([id number]) ADDITIONAL INFORMATION:(continued) NEGATIVE HER2/Chr17 Ratio <2.0 and Average HER2 copy number <4.0 EQUIVOCAL HER2/Chr17 Ratio <2.0 and Average HER2 copy number 4.0 and <6.0 POSITIVE HER2/Chr17 Ratio >=2.0 and/or Average HER2 copy number >=6.0 Joan Cutter MD Pathologist, Electronic Signature ( Signed 08/22/2013) 2. PROGNOSTIC INDICATORS - ACIS Results: IMMUNOHISTOCHEMICAL AND MORPHOMETRIC ANALYSIS BY THE AUTOMATED CELLULAR IMAGING SYSTEM (ACIS) Estrogen Receptor: 100%, POSITIVE, STRONG STAINING INTENSITY Progesterone Receptor: 99%, POSITIVE, STRONG STAINING INTENSITY Proliferation Marker Ki67: 59% REFERENCE RANGE ESTROGEN RECEPTOR NEGATIVE  <1% POSITIVE =>1% PROGESTERONE RECEPTOR NEGATIVE <1% POSITIVE =>1% All controls  stained appropriately Joan Cutter MD Pathologist, Electronic Signature ( Signed 08/23/2013) 2. CHROMOGENIC IN-SITU HYBRIDIZATION Results: HER2/NEU BY CISH - SHOWS AMPLIFICATION BY CISH ANALYSIS. RESULT RATIO OF HER2: CEP 17 SIGNALS 5.73 AVERAGE HER2 COPY NUMBER PER CELL 7.45 REFERENCE RANGE NEGATIVE HER2/Chr17 Ratio <2.0 and Average HER2 copy number <4.0 EQUIVOCAL HER2/Chr17 Ratio <2.0 and Average HER2 copy number 4.0 and <6.0 POSITIVE HER2/Chr17 Ratio >=2.0 and/or Average HER2 copy number >=6.0 Joan Cutter MD Pathologist, Electronic Signature ( Signed 08/22/2013) 2 of 4 FINAL for Joan Valdez, Joan Valdez ([id number]) FINAL DIAGNOSIS Diagnosis 1. Breast, right, needle core biopsy, mass, 6 o'clock, 5 cm fn - INVASIVE DUCTAL CARCINOMA. - DUCTAL CARCINOMA IN SITU WITH NECROSIS. - SEE COMMENT. 2. Lymph node, needle/core biopsy, right axilla - POSITIVE FOR DUCTAL CARCINOMA. - SEE COMMENT. Microscopic Comment 1. Although definitive grading of breast carcinoma is best done on excision, the features of the invasive tumor from the right 6 o'clock mass, 5 cm from nipple are compatible with a grade II breast carcinoma. Breast prognostic markers will be performed and reported in an addendum. Findings are called to the Joan Valdez on 08/17/13. Dr. Lyndon Valdez has seen this case in consultation with agreement. 2. Of note, almost the entire needle core biopsy of the specimen submitted a right axillary lymph node demonstrates nests of invasive ductal carcinoma. The findings may represent a replaced lymph node with tumor. Please correlate with clinical and radiologic impression. A breast prognostic profile will be performed on this second specimen and reported in an addendum. The findings are called to the Joan Valdez on 08/17/13. Dr. Lyndon Valdez has seen the second specimen in consultation with  agreement. (RAH:caf 08/17/13) Joan Niece MD Pathologist, Electronic Signature (Case signed 08/17/2013) Specimen Gross and Clinical Information Specimen Comment 1. 3 cm palpable spiculated mass with microcalcifications In formalin 9:50, extracted <1 min 2. Abnormal right axillary node In formalin 9:55, extracted <1 min Specimen(s) Obtained: 1. Breast, right, needle core biopsy  ASSESSMENT    43 year old female with   #1 triple positive breast cancer stage III node positive. Patient is now seen in medical oncology for start of neoadjuvant chemotherapy consisting of TCH/perjeta. Risks benefits and side effects of treatment were discussed with the patient. We discussed her staging studies. She has all of her antiemetics I counseled her about these. She will proceed with her scheduled treatment today.  #2 heavy menstrual periods and cramping: Patient and I discussed starting her on Zoladex injections. She has consented and we will do this starting next week.   Clinical Trial Eligibility: no Multidisciplinary conference discussion yes     PLAN:    #1 patient will proceed with cycle 1 day 1 of TCH/perjeta today.  #2 she will return tomorrow for Neulasta injection.  #3 I counseled her about use of anti-emetics.  #4 patient will also need to begin Zoladex injections q. monthly beginning next week.  #5 patient will be seen back in one week's time for followup. He   Discussion: Patient is being treated per NCCN breast cancer care guidelines appropriate for stage.III   Thank you so much for allowing me to participate in the care of Joan Valdez. I will continue to follow up the patient with you and assist in her care.  All questions were answered. The patient knows to call the clinic with any problems, questions or concerns. We can certainly see the patient much sooner if necessary.  I spent 20 minutes counseling the patient face to face. The total  time spent in the  appointment was 30 minutes.  Marcy Panning, MD Medical/Oncology [facility] [phone] (beeper) [phone] (Office)  09/19/2013, 10:59 AM

## 2013-09-19 NOTE — Patient Instructions (Signed)
[facility] Discharge Instructions for Patients Receiving Chemotherapy  Today you received the following chemotherapy agents: Herceptin, Perjeta, Taxotere, Carboplatin   To help prevent nausea and vomiting after your treatment, we encourage you to take your nausea medication as prescribed. You received Tylenol and Benadryl, Zofran and Decadron in the infusion room on 09/19/13.   If you develop nausea and vomiting that is not controlled by your nausea medication, call the clinic.   BELOW ARE SYMPTOMS THAT SHOULD BE REPORTED IMMEDIATELY:  *FEVER GREATER THAN 100.5 F  *CHILLS WITH OR WITHOUT FEVER  NAUSEA AND VOMITING THAT IS NOT CONTROLLED WITH YOUR NAUSEA MEDICATION  *UNUSUAL SHORTNESS OF BREATH  *UNUSUAL BRUISING OR BLEEDING  TENDERNESS IN MOUTH AND THROAT WITH OR WITHOUT PRESENCE OF ULCERS  *URINARY PROBLEMS  *BOWEL PROBLEMS  UNUSUAL RASH Items with * indicate a potential emergency and should be followed up as soon as possible.  Feel free to call the clinic you have any questions or concerns. The clinic phone number is [phone].

## 2013-09-19 NOTE — Progress Notes (Signed)
[place] Work  Clinical Social Work was referred by patient for assessment of psychosocial needs, and completion of advance directives.  Clinical Social Worker met with patient in the infusion room to offer support and complete advance directives.  CSW and pt reviewed and completed AD booklet.  AD where witnessed and notarized by CSW.  Copies where given to pt for her records, and a copy will be placed in the pt's electronic medical record.  CSW encouraged pt to call with any additional questions or concerns.     Johnnye Lana, MSW, [place] Worker [facility] [phone]

## 2013-09-20 ENCOUNTER — Ambulatory Visit (HOSPITAL_BASED_OUTPATIENT_CLINIC_OR_DEPARTMENT_OTHER): Payer: BC Managed Care – PPO

## 2013-09-20 ENCOUNTER — Telehealth: Payer: Self-pay | Admitting: *Deleted

## 2013-09-20 VITALS — BP 133/75 | HR 82 | Temp 97.7°F

## 2013-09-20 DIAGNOSIS — Z5189 Encounter for other specified aftercare: Secondary | ICD-10-CM

## 2013-09-20 DIAGNOSIS — C50511 Malignant neoplasm of lower-outer quadrant of right female breast: Secondary | ICD-10-CM

## 2013-09-20 DIAGNOSIS — C50519 Malignant neoplasm of lower-outer quadrant of unspecified female breast: Secondary | ICD-10-CM

## 2013-09-20 DIAGNOSIS — C773 Secondary and unspecified malignant neoplasm of axilla and upper limb lymph nodes: Secondary | ICD-10-CM

## 2013-09-20 MED ORDER — PEGFILGRASTIM INJECTION 6 MG/0.6ML
6.0000 mg | Freq: Once | SUBCUTANEOUS | Status: AC
  Administered 2013-09-20: 6 mg via SUBCUTANEOUS
  Filled 2013-09-20: qty 0.6

## 2013-09-20 NOTE — Patient Instructions (Signed)

## 2013-09-20 NOTE — Telephone Encounter (Signed)
Joan Valdez here for Neulasta injection following 1st tch/perj chemotherapy.  States that she is doing well.  Did wake up last night with a little nausea which was relieved with her nausea medicine.  No vomiting or diarrhea.  Is getting her fluids in and eating without problems.  All questions answered.  Knows to call if she had any problems or concerns.

## 2013-09-21 ENCOUNTER — Encounter: Payer: Self-pay | Admitting: Oncology

## 2013-09-22 ENCOUNTER — Telehealth: Payer: Self-pay | Admitting: Oncology

## 2013-09-22 NOTE — Telephone Encounter (Signed)
m °

## 2013-09-25 ENCOUNTER — Telehealth: Payer: Self-pay | Admitting: Oncology

## 2013-09-25 ENCOUNTER — Other Ambulatory Visit (HOSPITAL_COMMUNITY): Payer: BC Managed Care – PPO

## 2013-09-25 ENCOUNTER — Ambulatory Visit (HOSPITAL_BASED_OUTPATIENT_CLINIC_OR_DEPARTMENT_OTHER): Payer: BC Managed Care – PPO

## 2013-09-25 ENCOUNTER — Ambulatory Visit (HOSPITAL_BASED_OUTPATIENT_CLINIC_OR_DEPARTMENT_OTHER): Payer: BC Managed Care – PPO | Admitting: Hematology and Oncology

## 2013-09-25 ENCOUNTER — Ambulatory Visit (HOSPITAL_COMMUNITY)
Admission: RE | Admit: 2013-09-25 | Discharge: 2013-09-25 | Disposition: A | Discharge status: Home / Self Care | Payer: BC Managed Care – PPO | Source: Ambulatory Visit | Attending: Internal Medicine | Admitting: Internal Medicine

## 2013-09-25 ENCOUNTER — Telehealth: Payer: Self-pay | Admitting: *Deleted

## 2013-09-25 ENCOUNTER — Other Ambulatory Visit: Payer: Self-pay | Admitting: *Deleted

## 2013-09-25 ENCOUNTER — Other Ambulatory Visit: Payer: Self-pay | Admitting: Hematology and Oncology

## 2013-09-25 VITALS — BP 112/77 | HR 125 | Temp 98.6°F | Resp 20 | Ht 62.0 in | Wt 205.5 lb

## 2013-09-25 VITALS — BP 128/70 | HR 126 | Resp 20 | Wt 206.0 lb

## 2013-09-25 DIAGNOSIS — C50511 Malignant neoplasm of lower-outer quadrant of right female breast: Secondary | ICD-10-CM

## 2013-09-25 DIAGNOSIS — E860 Dehydration: Secondary | ICD-10-CM | POA: Insufficient documentation

## 2013-09-25 DIAGNOSIS — R000 Tachycardia, unspecified: Secondary | ICD-10-CM | POA: Insufficient documentation

## 2013-09-25 DIAGNOSIS — C50911 Malignant neoplasm of unspecified site of right female breast: Secondary | ICD-10-CM

## 2013-09-25 DIAGNOSIS — K219 Gastro-esophageal reflux disease without esophagitis: Secondary | ICD-10-CM | POA: Insufficient documentation

## 2013-09-25 DIAGNOSIS — Z5111 Encounter for antineoplastic chemotherapy: Secondary | ICD-10-CM

## 2013-09-25 DIAGNOSIS — C50519 Malignant neoplasm of lower-outer quadrant of unspecified female breast: Secondary | ICD-10-CM

## 2013-09-25 DIAGNOSIS — K1230 Oral mucositis (ulcerative), unspecified: Secondary | ICD-10-CM | POA: Insufficient documentation

## 2013-09-25 DIAGNOSIS — J029 Acute pharyngitis, unspecified: Secondary | ICD-10-CM | POA: Insufficient documentation

## 2013-09-25 DIAGNOSIS — C50919 Malignant neoplasm of unspecified site of unspecified female breast: Secondary | ICD-10-CM | POA: Insufficient documentation

## 2013-09-25 DIAGNOSIS — E039 Hypothyroidism, unspecified: Secondary | ICD-10-CM | POA: Insufficient documentation

## 2013-09-25 DIAGNOSIS — Z79899 Other long term (current) drug therapy: Secondary | ICD-10-CM | POA: Insufficient documentation

## 2013-09-25 DIAGNOSIS — N920 Excessive and frequent menstruation with regular cycle: Secondary | ICD-10-CM

## 2013-09-25 DIAGNOSIS — C773 Secondary and unspecified malignant neoplasm of axilla and upper limb lymph nodes: Secondary | ICD-10-CM

## 2013-09-25 DIAGNOSIS — K121 Other forms of stomatitis: Secondary | ICD-10-CM

## 2013-09-25 LAB — CBC WITH DIFFERENTIAL/PLATELET
BASO%: 0.2 % (ref 0.0–2.0)
Basophils Absolute: 0 10*3/uL (ref 0.0–0.1)
EOS ABS: 0 10*3/uL (ref 0.0–0.5)
EOS%: 0.4 % (ref 0.0–7.0)
HEMATOCRIT: 47.2 % — AB (ref 34.8–46.6)
HGB: 16.4 g/dL — ABNORMAL HIGH (ref 11.6–15.9)
LYMPH#: 2.7 10*3/uL (ref 0.9–3.3)
LYMPH%: 37 % (ref 14.0–49.7)
MCH: 30.1 pg (ref 25.1–34.0)
MCHC: 34.7 g/dL (ref 31.5–36.0)
MCV: 86.8 fL (ref 79.5–101.0)
MONO#: 1.5 10*3/uL — ABNORMAL HIGH (ref 0.1–0.9)
MONO%: 20.4 % — ABNORMAL HIGH (ref 0.0–14.0)
NEUT%: 42 % (ref 38.4–76.8)
NEUTROS ABS: 3 10*3/uL (ref 1.5–6.5)
Platelets: 130 10*3/uL — ABNORMAL LOW (ref 145–400)
RBC: 5.44 10*6/uL (ref 3.70–5.45)
RDW: 13 % (ref 11.2–14.5)
WBC: 7.2 10*3/uL (ref 3.9–10.3)

## 2013-09-25 LAB — COMPREHENSIVE METABOLIC PANEL (CC13)
ALBUMIN: 3.6 g/dL (ref 3.5–5.0)
ALT: 14 U/L (ref 0–55)
ANION GAP: 14 meq/L — AB (ref 3–11)
AST: 14 U/L (ref 5–34)
Alkaline Phosphatase: 123 U/L (ref 40–150)
BUN: 12 mg/dL (ref 7.0–26.0)
CALCIUM: 8.9 mg/dL (ref 8.4–10.4)
CO2: 24 meq/L (ref 22–29)
CREATININE: 0.8 mg/dL (ref 0.6–1.1)
Chloride: 102 mEq/L (ref 98–109)
GLUCOSE: 100 mg/dL (ref 70–140)
POTASSIUM: 3.2 meq/L — AB (ref 3.5–5.1)
Sodium: 139 mEq/L (ref 136–145)
Total Bilirubin: 0.68 mg/dL (ref 0.20–1.20)
Total Protein: 6.8 g/dL (ref 6.4–8.3)

## 2013-09-25 MED ORDER — SODIUM CHLORIDE 0.45 % IV SOLN: INTRAVENOUS | Status: DC

## 2013-09-25 MED ORDER — SODIUM CHLORIDE 0.9 % IV SOLN
INTRAVENOUS | Status: DC
  Administered 2013-09-25: 16:00:00 via INTRAVENOUS

## 2013-09-25 MED ORDER — MAGIC MOUTHWASH W/LIDOCAINE: ORAL | Status: DC

## 2013-09-25 MED ORDER — GOSERELIN ACETATE 3.6 MG ~~LOC~~ IMPL
3.6000 mg | DRUG_IMPLANT | Freq: Once | SUBCUTANEOUS | Status: AC
  Administered 2013-09-25: 3.6 mg via SUBCUTANEOUS
  Filled 2013-09-25: qty 3.6

## 2013-09-25 NOTE — Telephone Encounter (Signed)
, °

## 2013-09-25 NOTE — Progress Notes (Signed)
Joan Valdez [id number] 43 y.o. 09/25/2013 2:18 PM  CC  Joan Fraction, MD [address] [place] [address] Dr. Eddie Dibbles toth  Diagnosis:  43 year old female with new diagnosis of triple positive invasive ductal carcinoma of the right breast diagnosed 08/16/2013  STAGE:  Breast cancer of lower-outer quadrant of right female breast   Primary site: Breast (Right)   Staging method: AJCC 7th Edition   Clinical: Stage IIIA (T3, N1, cM0) signed by Deatra Robinson, MD on 09/21/2013  6:07 PM   Summary: Stage IIIA (T3, N1, cM0)  Current therapy: s/p TCH/perjeta cycle 1 day 1 chemotherapy completed on 09/19/2012  Chief complaint: Joan Valdez is here today with the complaints of not feeling well and extremely tired.   Past oncologic hisotry:  Joan Valdez is a 43 y.o. female.  Would medical history significant for hypothyroidism gastroesophageal reflux disease and migraine headaches.patient had a routine screening mammogram performed she was noted to have a large mass in the 6:00 position of the right breast measuring 3 cm for the primary mass with another 2 cm of calcifications anteriorly. She was also noted to have a enlarged lymph node in the right axilla. Patient underwent a needle core biopsy of the primary mass as well as axilla. The tumor was ER positive PR positive HER-2 positive with a proliferation marker Ki-67 59%. Patient had MRI of the breasts performed MRI showed right mass to measure 8.2 x 5.6 x 3.1 cm lymph node measured 2.5 cm on the left breast there was a 9 millimeter area of concern.   Interval history:  She was evaluated by cardiology today and found to be in tachycardic subsequently referred here for further evaluation. While she was in cardiology office she was noted to be dehydrated and tired. She complains of sore throat and not able to swallow. she also complains of a taste change in her mouth. She says that she's not able to drink enough fluids and  not able to eat well because of sore throat.    Past Medical History: Past Medical History  Diagnosis Date  . PCO (polycystic ovaries)   . Hypothyroid   . Allergic rhinitis   . Chronic headache   . Acid reflux   . Cervical dysplasia   . Stone, kidney     Hx: of  . Cancer     ;Breast cancer; pre-cancerous cervical cells    Past Surgical History: Past Surgical History  Procedure Laterality Date  . Cesarean section  2001  . Colposcopy    . Cervical biopsy  w/ loop electrode excision  2002  . Eye surgery      Hx: of metal fragment removed  . Portacath placement N/A 09/04/2013    Procedure: INSERTION PORT-A-CATH;  Surgeon: Merrie Roof, MD;  Location: [facility];  Service: General;  Laterality: N/A;    Family History: Family History  Problem Relation Age of Onset  . Hypertension Mother   . Migraines Mother   . Diabetes Paternal Grandmother   . Hypertension Paternal Grandmother   . Rheum arthritis Paternal Grandmother   . Heart disease Maternal Grandmother     Social History History  Substance Use Topics  . Smoking status: Never Smoker   . Smokeless tobacco: Never Used  . Alcohol Use: 1.0 oz/week    2 drink(s) per week     Comment: socially.    Allergies: Allergies  Allergen Reactions  . Imitrex [Sumatriptan Base] Anaphylaxis  . Amoxicillin Hives  .  Bactrim Hives  . Cephalexin Hives  . Erythromycin Hives  . Penicillins Hives  . Strawberry Hives  . Sulfa Antibiotics Hives  . Zithromax [Azithromycin Dihydrate] Hives    Current Medications: Current Outpatient Prescriptions  Medication Sig Dispense Refill  . cetirizine (ZYRTEC) 10 MG tablet Take 10 mg by mouth at bedtime.      Marland Kitchen dexamethasone (DECADRON) 4 MG tablet Take 2 tablets (8 mg total) by mouth 2 (two) times daily with a meal. Take two times a day the day before Taxotere. Then take two times a day starting the day after chemo for 3 days.  30 tablet  1  . dexlansoprazole (DEXILANT) 60 MG capsule Take  60 mg by mouth daily.      Marland Kitchen dihydroergotamine (MIGRANAL) 4 MG/ML nasal spray Place 1 spray into the nose as needed for migraine. Use in one nostril as directed.  No more than 4 sprays in one hour      . Eflornithine HCl (VANIQA) 13.9 % cream Apply pea-sized amount as directed  30 g  5  . ibuprofen (ADVIL,MOTRIN) 200 MG tablet Take 400 mg by mouth every 8 (eight) hours as needed. For pain      . levothyroxine (SYNTHROID, LEVOTHROID) 100 MCG tablet Take 100 mcg by mouth daily.       Marland Kitchen lidocaine-prilocaine (EMLA) cream Apply topically as needed.  30 g  6  . liothyronine (CYTOMEL) 25 MCG tablet Take 12.5 mcg by mouth 2 (two) times daily.       Marland Kitchen LORazepam (ATIVAN) 0.5 MG tablet Take 1 tablet (0.5 mg total) by mouth every 6 (six) hours as needed (Nausea or vomiting).  30 tablet  0  . mometasone (NASONEX) 50 MCG/ACT nasal spray Place 2 sprays into both nostrils daily.       . ondansetron (ZOFRAN) 8 MG tablet Take 1 tablet (8 mg total) by mouth 2 (two) times daily. Take two times a day starting the day after chemo for 3 days. Then take two times a day as needed for nausea or vomiting.  30 tablet  1  . oxyCODONE-acetaminophen (ROXICET) 5-325 MG per tablet Take 1-2 tablets by mouth every 4 (four) hours as needed for severe pain.  30 tablet  0  . prochlorperazine (COMPAZINE) 10 MG tablet Take 1 tablet (10 mg total) by mouth every 6 (six) hours as needed (Nausea or vomiting).  30 tablet  1  . ranitidine (ZANTAC) 300 MG tablet Take 300 mg by mouth at bedtime.      . topiramate (TOPAMAX) 100 MG tablet Take 200 mg by mouth at bedtime.      . Nutritional Supplements (JUICE PLUS FIBRE PO) Take 1 tablet by mouth daily.       Current Facility-Administered Medications  Medication Dose Route Frequency Provider Last Rate Last Dose  . 0.45 % sodium chloride infusion   Intravenous Continuous Wilmon Arms, MD      . goserelin (ZOLADEX) injection 3.6 mg  3.6 mg Subcutaneous Once Wilmon Arms, MD        OB/GYN  History:menarche at age 84 she is premenopausal last menstrual period was on 08/11/2013. First live birth at 78.  Fertility Discussion:patient has completed her family Prior History of Cancer: no  Health Maintenance:  Colonoscopy no Bone Densityno Last PAP smearup-to-date  ECOG PERFORMANCE STATUS: 1 - Symptomatic but completely ambulatory  Genetic Counseling/testing: yes  REVIEW OF SYSTEMS: She complains of tiredness, sore throat, taste changes, constipation. She denies any shortness of breath  chest pain blood in the stool blood in the urine. She complains of nosebleeds intermittently when blowing her nose. She denies any fever, headaches and no blurred vision. All 14 review of systems has been assessed and positive findings are mentioned above.  EXAMINATION: Blood pressure 112/77, pulse 125, temperature 98.6 F (37 C), temperature source Oral, resp. rate 20, height $RemoveBe'5\' 2"'fWxSrDSAo$  (1.575 m), weight 205 lb 8 oz (93.214 kg), last menstrual period 09/06/2013. repeat pulse rate 110 per minute   GENERAL: healthy, no distress, well nourished and well developed SKIN: skin color, texture, turgor are normal HEAD: Normocephalic EYES: PERRLA, EOMI, Conjunctiva are pink and non-injected EARS: External ears normal OROPHARYNX: Oral mucositis noted. No ulcers noticed.  NECK: no adenopathy LYMPH:  no hepatosplenomegaly BREAST:left breast normal without mass, skin or nipple changes or axillary nodes, right breast and right axilla palpable masses there is erythema ecchymosis secondary to the biopsy LUNGS: clear to auscultation and percussion HEART: regular rate & rhythm ABDOMEN:abdomen soft, non-tender, normal bowel sounds and no masses or organomegaly BACK: No CVA tenderness EXTREMITIES:less then 2 second capillary refill, no edema, no clubbing, no cyanosis  NEURO: alert & oriented x 3 with fluent speech, no focal motor/sensory deficits, gait normal     STUDIES/RESULTS: US Breast Right  08/16/2013    CLINICAL DATA:  Patient returns for evaluation of a mass in the right breast noted on recent screening study dated 07/19/2013.  EXAM: DIGITAL DIAGNOSTIC  RIGHT LIMITED MAMMOGRAM  ULTRASOUND RIGHT BREAST  COMPARISON:  10/21/2007  ACR Breast Density Category b: There are scattered areas of fibroglandular density.  FINDINGS: Additional views confirm the presence of a spiculated mass with microcalcifications in the 6 o'clock position of the right breast posteriorly. This measures at least 4.2 x 3.9 x 2.8 cm mammographically. There are highly suspicious pleomorphic microcalcifications both within the mass and extending anteriorly from the mass towards the nipple. In addition, there are grouped calcifications superior and lateral to the mass which cover a length of approximately 18 mm.  On physical exam, I palpate a 3-4 cm mass at 6 o'clock 5 cm from the right nipple.  Ultrasound is performed, showing an irregular hypoechoic mass with microcalcifications at 6 o'clock 5 cm from the right nipple. This measures at least 2.0 x 1.9 x 3.0 cm. It has a hyperechoic halo. There is a hypoechoic tubular structure with microcalcifications extending a length of 1.7 cm from the superior margin the mass towards the nipple. Sonography of the right axilla demonstrates a lymph node with a markedly thickened cortex.  The appearance is highly suspicious for invasive mammary carcinoma with associated ductal carcinoma and axillary metastasis. Ultrasound-guided core needle biopsy of the mass is suggested today. If carcinoma is confirmed, MRI could be performed to assess for any additional areas that may need to be biopsied if breast conservation is desired.  IMPRESSION: Highly suspicious mass with microcalcifications an abnormal right axillary lymph node as described above.  RECOMMENDATION: Ultrasound-guided core needle biopsy of the mass is recommended and will be performed and reported separately.  I have discussed the findings and  recommendations with the patient. Results were also provided in writing at the conclusion of the visit. If applicable, a reminder letter will be sent to the patient regarding the next appointment.  Report was telephoned to [facility] at Dr. Zelphia Cairo office.  BI-RADS CATEGORY  5: Highly suggestive of malignancy - appropriate action should be taken.   Electronically Signed   By: Ellie Lunch.D.  On: 08/16/2013 10:41   Mr Breast Bilateral W Wo Contrast  08/25/2013   CLINICAL DATA:  43 year old female with recently diagnosed invasive ductal carcinoma of the right breast with right axillary lymph node metastases.  EXAM: BILATERAL BREAST MRI WITH AND WITHOUT CONTRAST  LABS:  No recent labs.  TECHNIQUE: Multiplanar, multisequence MR images of both breasts were obtained prior to and following the intravenous administration of 20ml of MultiHance.  THREE-DIMENSIONAL MR IMAGE RENDERING ON INDEPENDENT WORKSTATION:  Three-dimensional MR images were rendered by post-processing of the original MR data on an independent workstation. The three-dimensional MR images were interpreted, and findings are reported in the following complete MRI report for this study. Three dimensional images were evaluated at the independent DynaCad workstation  COMPARISON:  Previous exams  FINDINGS: Breast composition: c:  Heterogeneous fibroglandular tissue  Background parenchymal enhancement: Mild.  Right breast: Large irregular spiculated mass in the lower outer quadrant of the right breast with contiguous linear clumped non mass enhancement extending posteriorly consistent with known biopsy proven malignancy measures approximately 8.2 cm AP, 5.6 cm transverse, and 3.1 cm craniocaudal. Biopsy clip artifact is present along the inferior margin of the dominant mass. A probable satellite nodule just superior to the dominant mass measures approximately 6 mm. No findings to suggest skin, nipple, or pectoralis muscle involvement.  Left breast:  There is linear non mass enhancement in the inferior left breast at the approximate 6 o'clock position mid to posterior depth measuring approximately 9 mm AP. No abnormal skin, nipple, or pectoralis enhancement.  Lymph nodes: Enlarged and morphologically abnormal lymph node in the right axilla measures 2.5 x 1.2 cm, compatible with known right axillary nodal metastases. No internal mammary lymphadenopathy seen.  Ancillary findings:  None.  IMPRESSION: 1. Biopsy proven malignancy in the lower outer right breast with contiguous linear clumped enhancement extending posteriorly and a probable small satellite nodule present superiorly, with total extent of disease measuring up to 8.2 cm AP. Morphologically abnormal lymph node in the right axilla, compatible with biopsy proven nodal metastases.  2. Indeterminate linear non mass enhancement in the inferior left breast at the approximate 6 o'clock position mid to posterior depth measuring approximately 9 mm.  RECOMMENDATION: 1. MRI guided biopsy of the indeterminate linear non mass enhancement in the inferior left breast is recommended.  2.  Treatment plan for known right breast malignancy.  BI-RADS CATEGORY  4: Suspicious abnormality - biopsy should be considered.   Electronically Signed   By: Everlean Alstrom M.D.   On: 08/25/2013 14:02   Mm [place] R  08/16/2013   CLINICAL DATA:  Patient returns for evaluation of a mass in the right breast noted on recent screening study dated 07/19/2013.  EXAM: DIGITAL DIAGNOSTIC  RIGHT LIMITED MAMMOGRAM  ULTRASOUND RIGHT BREAST  COMPARISON:  10/21/2007  ACR Breast Density Category b: There are scattered areas of fibroglandular density.  FINDINGS: Additional views confirm the presence of a spiculated mass with microcalcifications in the 6 o'clock position of the right breast posteriorly. This measures at least 4.2 x 3.9 x 2.8 cm mammographically. There are highly suspicious pleomorphic microcalcifications both within the  mass and extending anteriorly from the mass towards the nipple. In addition, there are grouped calcifications superior and lateral to the mass which cover a length of approximately 18 mm.  On physical exam, I palpate a 3-4 cm mass at 6 o'clock 5 cm from the right nipple.  Ultrasound is performed, showing an irregular hypoechoic mass with microcalcifications at  6 o'clock 5 cm from the right nipple. This measures at least 2.0 x 1.9 x 3.0 cm. It has a hyperechoic halo. There is a hypoechoic tubular structure with microcalcifications extending a length of 1.7 cm from the superior margin the mass towards the nipple. Sonography of the right axilla demonstrates a lymph node with a markedly thickened cortex.  The appearance is highly suspicious for invasive mammary carcinoma with associated ductal carcinoma and axillary metastasis. Ultrasound-guided core needle biopsy of the mass is suggested today. If carcinoma is confirmed, MRI could be performed to assess for any additional areas that may need to be biopsied if breast conservation is desired.  IMPRESSION: Highly suspicious mass with microcalcifications an abnormal right axillary lymph node as described above.  RECOMMENDATION: Ultrasound-guided core needle biopsy of the mass is recommended and will be performed and reported separately.  I have discussed the findings and recommendations with the patient. Results were also provided in writing at the conclusion of the visit. If applicable, a reminder letter will be sent to the patient regarding the next appointment.  Report was telephoned to [facility] at Dr. Zelphia Cairo office.  BI-RADS CATEGORY  5: Highly suggestive of malignancy - appropriate action should be taken.   Electronically Signed   By: Ulyess Blossom M.D.   On: 08/16/2013 10:41   Mm Digital Diagnostic [place]il[place] R  08/16/2013   CLINICAL DATA:  Ultrasound-guided core needle biopsy of a mass at 6 o'clock 5 cm from the right nipple with clip placement.  EXAM:  DIAGNOSTIC RIGHT MAMMOGRAM POST ULTRASOUND BIOPSY  COMPARISON:  Previous exams  FINDINGS: Mammographic images were obtained following ultrasound guided biopsy of a mass at 6 o'clock 5 cm from the right nipple. The coil clip is appropriately positioned within the mass.  IMPRESSION: Appropriate clip placement following ultrasound-guided core needle biopsy of the mass at 6 o'clock 5 cm from the right nipple.  Final Assessment: Post Procedure Mammograms for Marker Placement   Electronically Signed   By: Ulyess Blossom M.D.   On: 08/16/2013 10:48   Mm Radiologist Eval And Mgmt  08/17/2013   EXAM: ESTABLISHED PATIENT OFFICE VISIT -LEVEL II [phone])  HISTORY OF PRESENT ILLNESS: Spiculated mass in the right breast at 6 o'clock biopsied with ultrasound guidance on 08/15/2013  CHIEF COMPLAINT: The patient returns to obtain pathology results for ultrasound-guided biopsy of right breast mass and abnormal right lymph node performed on 08/15/2013  PHYSICAL EXAMINATION: Both biopsy sites are dry with no evidence for seepage or signs of infection. A small area of bruising is present associated with the biopsy site for the mass and the patient reports some discomfort with this biopsy. No discomfort is present associated with the biopsy site of the lymph node.  ASSESSMENT AND PLAN: The patient has been scheduled for surgical followup with Dr. Marlou Starks on 08/24/2013 and MRI of the breast on 08/25/2013. All of the patient's questions were answered.  CONSULTATION: PATHOLOGY: Biopsy of the mass reveals grade 2 invasive ductal carcinoma and ductal carcinoma in situ with necrosis. Biopsy of the lymph node reveals almost complete involvement with carcinoma.  CONCORDANT:  Yes  RECOMMENDATION: Surgical consultation and MRI of the breast as scheduled above   Electronically Signed   By: Willadean Carol M.D.   On: 08/17/2013 17:19   [place] Rt Breast Bx W Loc Dev 1st Lesion Img Bx Spec US Guide  08/16/2013   CLINICAL DATA:  Spiculated mass with  microcalcifications at 6 o'clock 5 cm from the right nipple.  EXAM: ULTRASOUND GUIDED RIGHT BREAST CORE NEEDLE BIOPSY WITH VACUUM ASSIST  COMPARISON:  Previous exams.  PROCEDURE: I met with the patient and we discussed the procedure of ultrasound-guided biopsy, including benefits and alternatives. We discussed the high likelihood of a successful procedure. We discussed the risks of the procedure including infection, bleeding, tissue injury, clip migration, and inadequate sampling. Informed written consent was given. The usual time-out protocol was performed immediately prior to the procedure.  Using sterile technique, 2% lidocaine for local anesthesia, and ultrasound guidance, a 12 gauge vacuum-assisteddevice was used to perform biopsy of the mass at 6 o'clock in the right breast using a College cranial approach. At the conclusion of the procedure, a coil tissue marker clip was deployed into the biopsy cavity. Follow-up 2-view mammogram was performed and dictated separately.  IMPRESSION: Ultrasound-guided biopsy of a spiculated mass with microcalcifications at 6 o'clock 5 cm from the right nipple. No apparent complications.   Electronically Signed   By: Ulyess Blossom M.D.   On: 08/16/2013 10:46   [place] Rt Breast Bx W Loc Dev Ea Add Lesion Img Bx Spec US Guide  08/16/2013   CLINICAL DATA:  Abnormal right axillary lymph node.  EXAM: ULTRASOUND GUIDED RIGHT AXILLA CORE NEEDLE BIOPSY  COMPARISON:  Previous exams.  FINDINGS: I met with the patient and we discussed the procedure of ultrasound-guided biopsy, including benefits and alternatives. We discussed the high likelihood of a successful procedure. We discussed the risks of the procedure, including infection, bleeding, tissue injury, clip migration, and inadequate sampling. Informed written consent was given. The usual time-out protocol was performed immediately prior to the procedure.  Using sterile technique, 2% Lidocaine as local anesthetic, and ultrasound  guidance, a 14 gauge spring-loadeddevice was used to perform biopsy of the abnormal right axillary lymph node using an oblique lateromedial approach.  IMPRESSION: Ultrasound guided biopsy of an abnormal right axillary lymph node. No apparent complications.   Electronically Signed   By: Ulyess Blossom M.D.   On: 08/16/2013 10:45     LABS:    Chemistry      Component Value Date/Time   NA 139 09/25/2013 1325   NA 138 08/30/2013 0916   K 3.2* 09/25/2013 1325   K 4.0 08/30/2013 0916   CL 105 08/30/2013 0916   CO2 24 09/25/2013 1325   CO2 21 08/30/2013 0916   BUN 12.0 09/25/2013 1325   BUN 10 08/30/2013 0916   CREATININE 0.8 09/25/2013 1325   CREATININE 0.60 08/30/2013 0916      Component Value Date/Time   CALCIUM 8.9 09/25/2013 1325   CALCIUM 9.3 08/30/2013 0916   ALKPHOS 123 09/25/2013 1325   AST 14 09/25/2013 1325   ALT 14 09/25/2013 1325   BILITOT 0.68 09/25/2013 1325      Lab Results  Component Value Date   WBC 7.2 09/25/2013   HGB 16.4* 09/25/2013   HCT 47.2* 09/25/2013   MCV 86.8 09/25/2013   PLT 130* 09/25/2013       PATHOLOGY: ADDITIONAL INFORMATION: 1. PROGNOSTIC INDICATORS - ACIS Results: IMMUNOHISTOCHEMICAL AND MORPHOMETRIC ANALYSIS BY THE AUTOMATED CELLULAR IMAGING SYSTEM (ACIS) Estrogen Receptor: 100%, POSITIVE, STRONG STAINING INTENSITY Progesterone Receptor: 100%, POSITIVE, STRONG STAINING INTENSITY Proliferation Marker Ki67: 72% REFERENCE RANGE ESTROGEN RECEPTOR NEGATIVE <1% POSITIVE =>1% PROGESTERONE RECEPTOR NEGATIVE <1% POSITIVE =>1% All controls stained appropriately Joan Cutter MD Pathologist, Electronic Signature ( Signed 08/23/2013) 1. CHROMOGENIC IN-SITU HYBRIDIZATION Results: HER2/NEU BY CISH - SHOWS AMPLIFICATION BY CISH ANALYSIS. RESULT RATIO OF HER2: CEP 17 SIGNALS  5.77 AVERAGE HER2 COPY NUMBER PER CELL 7.50 REFERENCE RANGE 1 of 4 FINAL for Joan [facility], Joan [facility]dez ([id number]) ADDITIONAL INFORMATION:(continued) NEGATIVE HER2/Chr17 Ratio  <2.0 and Average HER2 copy number <4.0 EQUIVOCAL HER2/Chr17 Ratio <2.0 and Average HER2 copy number 4.0 and <6.0 POSITIVE HER2/Chr17 Ratio >=2.0 and/or Average HER2 copy number >=6.0 Joan Cutter MD Pathologist, Electronic Signature ( Signed 08/22/2013) 2. PROGNOSTIC INDICATORS - ACIS Results: IMMUNOHISTOCHEMICAL AND MORPHOMETRIC ANALYSIS BY THE AUTOMATED CELLULAR IMAGING SYSTEM (ACIS) Estrogen Receptor: 100%, POSITIVE, STRONG STAINING INTENSITY Progesterone Receptor: 99%, POSITIVE, STRONG STAINING INTENSITY Proliferation Marker Ki67: 59% REFERENCE RANGE ESTROGEN RECEPTOR NEGATIVE <1% POSITIVE =>1% PROGESTERONE RECEPTOR NEGATIVE <1% POSITIVE =>1% All controls stained appropriately Joan Cutter MD Pathologist, Electronic Signature ( Signed 08/23/2013) 2. CHROMOGENIC IN-SITU HYBRIDIZATION Results: HER2/NEU BY CISH - SHOWS AMPLIFICATION BY CISH ANALYSIS. RESULT RATIO OF HER2: CEP 17 SIGNALS 5.73 AVERAGE HER2 COPY NUMBER PER CELL 7.45 REFERENCE RANGE NEGATIVE HER2/Chr17 Ratio <2.0 and Average HER2 copy number <4.0 EQUIVOCAL HER2/Chr17 Ratio <2.0 and Average HER2 copy number 4.0 and <6.0 POSITIVE HER2/Chr17 Ratio >=2.0 and/or Average HER2 copy number >=6.0 Joan Cutter MD Pathologist, Electronic Signature ( Signed 08/22/2013) 2 of 4 FINAL for Joan Valdez, Joan Valdez ([id number]) FINAL DIAGNOSIS Diagnosis 1. Breast, right, needle core biopsy, mass, 6 o'clock, 5 cm fn - INVASIVE DUCTAL CARCINOMA. - DUCTAL CARCINOMA IN SITU WITH NECROSIS. - SEE COMMENT. 2. Lymph node, needle/core biopsy, right axilla - POSITIVE FOR DUCTAL CARCINOMA. - SEE COMMENT. Microscopic Comment 1. Although definitive grading of breast carcinoma is best done on excision, the features of the invasive tumor from the right 6 o'clock mass, 5 cm from nipple are compatible with a grade II breast carcinoma. Breast prognostic markers will be performed and reported in an addendum. Findings are called to the  Joan Valdez on 08/17/13. Dr. Lyndon Code has seen this case in consultation with agreement. 2. Of note, almost the entire needle core biopsy of the specimen submitted a right axillary lymph node demonstrates nests of invasive ductal carcinoma. The findings may represent a replaced lymph node with tumor. Please correlate with clinical and radiologic impression. A breast prognostic profile will be performed on this second specimen and reported in an addendum. The findings are called to the [place] on 08/17/13. Dr. Lyndon Code has seen the second specimen in consultation with agreement. (RAH:caf 08/17/13) Willeen Niece MD Pathologist, Electronic Signature (Case signed 08/17/2013) Specimen Gross and Clinical Information Specimen Comment 1. 3 cm palpable spiculated mass with microcalcifications In formalin 9:50, extracted <1 min 2. Abnormal right axillary node In formalin 9:55, extracted <1 min Specimen(s) Obtained: 1. 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  status post completion of cycle 1 neoadjuvant chemotherapy consisting of TCH/perjeta on 09/19/2013 with Neulasta support 09/20/2013  #2 Oral mucositis: I have given a prescription for Magic mouth wash consists of oral Xylocaine 2%, maalox, Benadryl and Carafate to swish and swallow every 3-4 hours as needed. #3 Dehydration: We'll give IV fluids 1.5 L of normal saline today #4 Tachycardia secondary to dehydration: we'll arrange for IV fluids follow up with the CMP which has been ordered today  #5 heavy menstrual periods and cramping:  will arrange for  Zoladex 3.6 mg injection today. explained the benefits and risks of Zoladex injection patient understood the same and would like to proceed with the Zoladex injection today.  Po #6. Poor po intake: I have encouraged the  patient to have boost one can with high proteins of  her choice with either vanilla or chocolate flavor to be taken at least twice daily.   Follow up in 1 week with CBC and differential and CMP      Discussion: Patient is being treated per NCCN breast cancer care guidelines appropriate for stage.III   Thank you so much for allowing me to participate in the care of Joan Valdez. I will continue to follow up the patient with you and assist in her care.  All questions were answered. The patient knows to call the clinic with any problems, questions or concerns. We can certainly see the patient much sooner if necessary.  I spent 20 minutes counseling the patient face to face. The total time spent in the appointment was 30 minutes.   Wilmon Arms , MD Medical/Oncology [facility] [phone] (beeper) [phone] (Office)  09/25/2013, 2:18 PM

## 2013-09-25 NOTE — Patient Instructions (Signed)
Goserelin injection What is this medicine? GOSERELIN (GOE se rel in) is similar to a hormone found in the body. It lowers the amount of sex hormones that the body makes. Men will have lower testosterone levels and women will have lower estrogen levels while taking this medicine. In men, this medicine is used to treat prostate cancer; the injection is either given once per month or once every 12 weeks. A once per month injection (only) is used to treat women with endometriosis, dysfunctional uterine bleeding, or advanced breast cancer. This medicine may be used for other purposes; ask your health care provider or pharmacist if you have questions. COMMON BRAND NAME(S): Zoladex What should I tell my health care provider before I take this medicine? They need to know if you have any of these conditions (some only apply to women): -diabetes -heart disease or previous heart attack -high blood pressure -high cholesterol -kidney disease -osteoporosis or low bone density -problems passing urine -spinal cord injury -stroke -tobacco smoker -an unusual or allergic reaction to goserelin, hormone therapy, other medicines, foods, dyes, or preservatives -pregnant or trying to get pregnant -breast-feeding How should I use this medicine? This medicine is for injection under the skin. It is given by a health care professional in a hospital or clinic setting. Men receive this injection once every 4 weeks or once every 12 weeks. Women will only receive the once every 4 weeks injection. Talk to your pediatrician regarding the use of this medicine in children. Special care may be needed. Overdosage: If you think you have taken too much of this medicine contact a poison control center or emergency room at once. NOTE: This medicine is only for you. Do not share this medicine with others. What if I miss a dose? It is important not to miss your dose. Call your doctor or health care professional if you are unable to  keep an appointment. What may interact with this medicine? -female hormones like estrogen -herbal or dietary supplements like black cohosh, chasteberry, or DHEA -female hormones like testosterone -prasterone This list may not describe all possible interactions. Give your health care provider a list of all the medicines, herbs, non-prescription drugs, or dietary supplements you use. Also tell them if you smoke, drink alcohol, or use illegal drugs. Some items may interact with your medicine. What should I watch for while using this medicine? Visit your doctor or health care professional for regular checks on your progress. Your symptoms may appear to get worse during the first weeks of this therapy. Tell your doctor or healthcare professional if your symptoms do not start to get better or if they get worse after this time. Your bones may get weaker if you take this medicine for a long time. If you smoke or frequently drink alcohol you may increase your risk of bone loss. A family history of osteoporosis, chronic use of drugs for seizures (convulsions), or corticosteroids can also increase your risk of bone loss. Talk to your doctor about how to keep your bones strong. This medicine should stop regular monthly menstration in women. Tell your doctor if you continue to menstrate. Women should not become pregnant while taking this medicine or for 12 weeks after stopping this medicine. Women should inform their doctor if they wish to become pregnant or think they might be pregnant. There is a potential for serious side effects to an unborn child. Talk to your health care professional or pharmacist for more information. Do not breast-feed an infant while taking   this medicine. Men should inform their doctors if they wish to father a child. This medicine may lower sperm counts. Talk to your health care professional or pharmacist for more information. What side effects may I notice from receiving this  medicine? Side effects that you should report to your doctor or health care professional as soon as possible: -allergic reactions like skin rash, itching or hives, swelling of the face, lips, or tongue -bone pain -breathing problems -changes in vision -chest pain -feeling faint or lightheaded, falls -fever, chills -pain, swelling, warmth in the leg -pain, tingling, numbness in the hands or feet -swelling of the ankles, feet, hands -trouble passing urine or change in the amount of urine -unusually high or low blood pressure -unusually weak or tired Side effects that usually do not require medical attention (report to your doctor or health care professional if they continue or are bothersome): -change in sex drive or performance -changes in breast size in both males and females -changes in emotions or moods -headache -hot flashes -irritation at site where injected -loss of appetite -skin problems like acne, dry skin -vaginal dryness This list may not describe all possible side effects. Call your doctor for medical advice about side effects. You may report side effects to FDA at 1-800-FDA-1088. Where should I keep my medicine? This drug is given in a hospital or clinic and will not be stored at home. NOTE: This sheet is a summary. It may not cover all possible information. If you have questions about this medicine, talk to your doctor, pharmacist, or health care provider.  2014, [employer]/Gold Standard. (2009-01-15 13:28:29)  

## 2013-09-25 NOTE — Patient Instructions (Signed)
We will contact you in 3 months to schedule your next appointment and echocardiogram  

## 2013-09-25 NOTE — Addendum Note (Signed)
Addended by: Lannette Donath E on: 09/25/2013 03:55 PM   Modules accepted: Orders

## 2013-09-25 NOTE — Progress Notes (Signed)
Patient ID: Joan Valdez, female   DOB: May 26, 1971, 43 y.o.   MRN: [id number] Referring Physician: Dr. Chancy Milroy Primary Care: Dr. Dennard Schaumann  HPI:  Ms. 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 She has a PMH of hypothyroidism, GERD, and migraine headaches.  Started on neoadjuvant chemotherapy consisting of TCH/perjeta. Will begin Zoladex injections q month starting this week.   Denies any known h/o heart disease. No exertional dyspnea or HF symptoms. Over past 24 hours + sore throat and weakness.   ECHO 09/15/2013: EF 65% lateral s' 14.1    Review of Systems: [y] = yes, [ ]  = no   General: Weight gain Aqua.Slicker ]; Weight loss [ ] ; Anorexia [ ] ; Fatigue [ ] ; Fever [ ] ; Chills [ ] ; Weakness [ ]   Cardiac: Chest pain/pressure Aqua.Slicker ]; Resting SOB Aqua.Slicker ]; Exertional SOB Aqua.Slicker ]; Orthopnea [ ] ; Pedal Edema [ ] ; Palpitations [ ] ; Syncope [ ] ; Presyncope [ ] ; Paroxysmal nocturnal dyspnea[ ]   Pulmonary: Cough Aqua.Slicker ]; Wheezing[ ] ; Hemoptysis[ ] ; Sputum [ ] ; Snoring [ ]   GI: Vomiting[ ] ; Dysphagia[ ] ; Melena[ ] ; Hematochezia [ ] ; Heartburn[ ] ; Abdominal pain [ ] ; Constipation [ ] ; Diarrhea [ ] ; BRBPR [ ]   GU: Hematuria[ ] ; Dysuria [ ] ; Nocturia[ ]   Vascular: Pain in legs with walking [ ] ; Pain in feet with lying flat [ ] ; Non-healing sores [ ] ; Stroke [ ] ; TIA [ ] ; Slurred speech [ ] ;  Neuro: Headaches[ ] ; Vertigo[ ] ; Seizures[ ] ; Paresthesias[ ] ;Blurred vision [ ] ; Diplopia [ ] ; Vision changes [ ]   Ortho/Skin: Arthritis [ ] ; Joint pain [ ] ; Muscle pain [ ] ; Joint swelling [ ] ; Back Pain [ ] ; Rash [ ]   Psych: Depression[ ] ; Anxiety[ ]   Heme: Bleeding problems [ ] ; Clotting disorders [ ] ; Anemia [ ]   Endocrine: Diabetes [ ] ; Thyroid dysfunction[ ]    Past Medical History  Diagnosis Date  . PCO (polycystic ovaries)   . Hypothyroid   . Allergic rhinitis   . Chronic headache   . Acid reflux   . Cervical dysplasia    . Stone, kidney     Hx: of  . Cancer     ;Breast cancer; pre-cancerous cervical cells    Current Outpatient Prescriptions  Medication Sig Dispense Refill  . cetirizine (ZYRTEC) 10 MG tablet Take 10 mg by mouth at bedtime.      Marland Kitchen dexamethasone (DECADRON) 4 MG tablet Take 2 tablets (8 mg total) by mouth 2 (two) times daily with a meal. Take two times a day the day before Taxotere. Then take two times a day starting the day after chemo for 3 days.  30 tablet  1  . dexlansoprazole (DEXILANT) 60 MG capsule Take 60 mg by mouth daily.      Marland Kitchen dihydroergotamine (MIGRANAL) 4 MG/ML nasal spray Place 1 spray into the nose as needed for migraine. Use in one nostril as directed.  No more than 4 sprays in one hour      . Eflornithine HCl (VANIQA) 13.9 % cream Apply pea-sized amount as directed  30 g  5  . ibuprofen (ADVIL,MOTRIN) 200 MG tablet Take 400 mg by mouth every 8 (eight) hours as needed. For pain      . levothyroxine (SYNTHROID, LEVOTHROID) 100 MCG tablet Take 100 mcg by mouth daily.       Marland Kitchen lidocaine-prilocaine (EMLA) cream Apply topically as needed.  30 g  6  . liothyronine (CYTOMEL) 25 MCG tablet Take 12.5 mcg by mouth 2 (two) times daily.       Marland Kitchen LORazepam (ATIVAN) 0.5 MG tablet Take 1 tablet (0.5 mg total) by mouth every 6 (six) hours as needed (Nausea or vomiting).  30 tablet  0  . mometasone (NASONEX) 50 MCG/ACT nasal spray Place 2 sprays into both nostrils daily.       . Nutritional Supplements (JUICE PLUS FIBRE PO) Take 1 tablet by mouth daily.      . ondansetron (ZOFRAN) 8 MG tablet Take 1 tablet (8 mg total) by mouth 2 (two) times daily. Take two times a day starting the day after chemo for 3 days. Then take two times a day as needed for nausea or vomiting.  30 tablet  1  . oxyCODONE-acetaminophen (ROXICET) 5-325 MG per tablet Take 1-2 tablets by mouth every 4 (four) hours as needed for severe pain.  30 tablet  0  . prochlorperazine (COMPAZINE) 10 MG tablet Take 1 tablet (10 mg total) by  mouth every 6 (six) hours as needed (Nausea or vomiting).  30 tablet  1  . ranitidine (ZANTAC) 300 MG tablet Take 300 mg by mouth at bedtime.      . topiramate (TOPAMAX) 100 MG tablet Take 200 mg by mouth at bedtime.       No current facility-administered medications for this encounter.    Allergies  Allergen Reactions  . Imitrex [Sumatriptan Base] Anaphylaxis  . Amoxicillin Hives  . Bactrim Hives  . Cephalexin Hives  . Erythromycin Hives  . Penicillins Hives  . Strawberry Hives  . Sulfa Antibiotics Hives  . Zithromax [Azithromycin Dihydrate] Hives    History   Social History  . Marital Status: Married    Spouse Name: N/A    Number of Children: Y  . Years of Education: N/A   Occupational History  . teacher    Social History Main Topics  . Smoking status: Never Smoker   . Smokeless tobacco: Never Used  . Alcohol Use: 1.0 oz/week    2 drink(s) per week     Comment: socially.  . Drug Use: No  . Sexual Activity: Yes    Birth Control/ Protection: Pill   Other Topics Concern  . Not on file   Social History Narrative  . No narrative on file    Family History  Problem Relation Age of Onset  . Hypertension Mother   . Migraines Mother   . Diabetes Paternal Grandmother   . Hypertension Paternal Grandmother   . Rheum arthritis Paternal Grandmother   . Heart disease Maternal Grandmother     PHYSICAL EXAM: Filed Vitals:   09/25/13 1144  BP: 128/70  Pulse: 126  Resp: 20  Weight: 206 lb (93.441 kg)  SpO2: 98%    General:  Fatigued appearing. No respiratory difficulty HEENT: normal x for Conjunctiva injected Neck: supple. no JVD. Carotids 2+ bilat; no bruits. + lymphadenopathy. No thryomegaly appreciated. Cor: PMI nondisplaced. Tachycardic regula. No rubs, gallops or + murmurs. Lungs: clear Abdomen: obese soft, nontender, nondistended. No hepatosplenomegaly. No bruits or masses. Good bowel sounds. Extremities: no cyanosis, clubbing, rash, edema Neuro: alert &  oriented x 3, cranial nerves grossly intact. moves all 4 extremities w/o difficulty. Affect pleasant   ASSESSMENT & PLAN:  1) Triple Positive Right Breast Cancer: - Recently diagnosed with breast cancer and was referred to the HF clinic to monitor her EF and lateral s' while on Herceptin therapy. Discussed in  length with patient the role of the HF/Oncology clinic. We talked about the 10% chance she could develop HF from her treatment. We discussed HF symptoms and to call if she notices any weight gain or SOB. - Dr. Haroldine Laws reviewed her ECHO and lateral s' and EF stable will continue to follow.  2) Tachycardia/sore throat - Concerned about tachycardia and feeling poorly, increased fatigue. Concerned she may be dehydrated. Dr. Haroldine Laws called oncology office and left message for them to call her back.    Junie Bame B NP-C 5:37 PM  Patient seen and examined with Junie Bame, NP. We discussed all aspects of the encounter. I agree with the assessment and plan as stated above.   Explained incidence of Herceptin cardiotoxicity and role of Cardio-oncology clinic at length. Echo images reviewed personally. All parameters stable. Reviewed signs and symptoms of HF to look for. Continue Herceptin. Follow-up with echo in 3 months. 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 I contacted [facility] and they will see her today for labs and IVF.   Benay Spice 6:13 PM

## 2013-09-25 NOTE — Telephone Encounter (Signed)
Patient called and left voicemail today that she is feeling horrible, has a sore throat and intermittent nose bleeding. She had appt with Dr Haroldine Laws with  Cardiology today and he states patient does look down and weak.  Per Dr Humphrey Rolls, we will have patient come in today and move up her NADIR check from tomorrow to today. We will have Dr Earnest Conroy see patient. Scheduling will call patient to come in as soon as she can to further assess patients side effects.

## 2013-09-26 ENCOUNTER — Ambulatory Visit: Payer: BC Managed Care – PPO | Admitting: Oncology

## 2013-09-26 ENCOUNTER — Telehealth: Payer: Self-pay | Admitting: *Deleted

## 2013-09-26 ENCOUNTER — Other Ambulatory Visit: Payer: BC Managed Care – PPO

## 2013-09-26 ENCOUNTER — Encounter (INDEPENDENT_AMBULATORY_CARE_PROVIDER_SITE_OTHER): Payer: BC Managed Care – PPO | Admitting: General Surgery

## 2013-09-26 ENCOUNTER — Encounter: Payer: Self-pay | Admitting: Radiation Oncology

## 2013-09-26 DIAGNOSIS — C801 Malignant (primary) neoplasm, unspecified: Secondary | ICD-10-CM | POA: Insufficient documentation

## 2013-09-26 NOTE — Progress Notes (Signed)
Location of Breast Cancer: Right  Lower outer quadrant  Histology per Pathology Report: 09/12/13:Diagnosis  Breast, left, needle core biopsy, 6 o'clock- FIBROCYSTIC CHANGES WITH USUAL DUCTAL HYPERPLASIA, Dr.Paul Toth  08/16/13:Diagnosis1. Breast, right, needle core biopsy, mass, 6 o'clock, 5 cm fn- INVASIVE DUCTAL CARCINOMA. - DUCTAL CARCINOMA IN SITU WITH NECROSIS.SEE COMMENT.2. Lymph node, needle/core biopsy, right axilla- POSITIVE FOR DUCTAL CARCINOMA.Dr. Autumn Messing  Receptor Status: ER( +  ), PR ( +  ), Her2-neu ( + )  Did patient present with symptoms (if so, please note symptoms) or was this found on screening mammography?: routine screening mammogram  Mass found at 6 0'clock postion  Past/Anticipated interventions by surgeon, if any:Dr. Marlou Starks  F/u  appt 10/03/13  Past/Anticipated interventions by medical oncology, if any: Chemotherapy s/p TCH/perjeta cycle 1 day 1 chemotherapy completed 09/19/12, appt covering MD 10/02/13, Dr.Khan appt 10/10/13 with infusion also  Lymphedema issues, if any: no  Pain issues, if any: throat  Pain using mmw has ulcer  On tongue and in back throat, using mmw, and left eye blood shot, started hurting yesterday  SAFETY ISSUES:  Prior radiation?NO  Pacemaker/ICD? }NO  Possible current pregnancy?NO  Is the patient on methotrexate? NO  Current Complaints / other details: Married,  1 daughter,  menarche age 52,premenopausal, last menses 08/11/13, 1st live birth age 55  Cervical dysplasia, with bx with loop electrode excision 2002, , C-section 2001 eye surgery hx metal removed, Portacath  Placement 09/04/13, non smoker, 2 drinks  alcohol weekly  hx Migraines  multiple allergies Imitrex=anaphylaxis  Roslynn Amble Felicita Gage, RN 09/26/2013,10:15 AM

## 2013-09-26 NOTE — Telephone Encounter (Signed)
Called to check on patient today following visit with Dr Earnest Conroy and IV fluids yesterday. She states she is feeling slightly better, was unable to go to pharmacy last night, but will get magic mouthwash and start this morning. Encouraged patient to do this to help with her sore throat and continue hydrating well. Spouse at patient side able to check for pulse as this nurse requested, he states patient pulse around 80bpm today, improved from her elevated tachycardia yesterday. Patient instructed to call us with any questions or concerns or if she develops any uncontrolled nausea, vomiting or fever.

## 2013-09-27 ENCOUNTER — Ambulatory Visit
Admission: RE | Admit: 2013-09-27 | Discharge: 2013-09-27 | Disposition: A | Discharge status: Home / Self Care | Payer: BC Managed Care – PPO | Source: Ambulatory Visit | Attending: Radiation Oncology | Admitting: Radiation Oncology

## 2013-09-27 ENCOUNTER — Encounter: Payer: Self-pay | Admitting: Radiation Oncology

## 2013-09-27 VITALS — BP 112/71 | HR 117 | Temp 98.2°F | Resp 20 | Wt 206.1 lb

## 2013-09-27 DIAGNOSIS — E039 Hypothyroidism, unspecified: Secondary | ICD-10-CM | POA: Insufficient documentation

## 2013-09-27 DIAGNOSIS — Z170 Estrogen receptor positive status [ER+]: Secondary | ICD-10-CM | POA: Insufficient documentation

## 2013-09-27 DIAGNOSIS — E282 Polycystic ovarian syndrome: Secondary | ICD-10-CM | POA: Insufficient documentation

## 2013-09-27 DIAGNOSIS — C50511 Malignant neoplasm of lower-outer quadrant of right female breast: Secondary | ICD-10-CM

## 2013-09-27 DIAGNOSIS — R05 Cough: Secondary | ICD-10-CM | POA: Insufficient documentation

## 2013-09-27 DIAGNOSIS — Z87442 Personal history of urinary calculi: Secondary | ICD-10-CM | POA: Insufficient documentation

## 2013-09-27 DIAGNOSIS — Z79899 Other long term (current) drug therapy: Secondary | ICD-10-CM | POA: Insufficient documentation

## 2013-09-27 DIAGNOSIS — N289 Disorder of kidney and ureter, unspecified: Secondary | ICD-10-CM | POA: Insufficient documentation

## 2013-09-27 DIAGNOSIS — C50519 Malignant neoplasm of lower-outer quadrant of unspecified female breast: Secondary | ICD-10-CM | POA: Insufficient documentation

## 2013-09-27 DIAGNOSIS — R059 Cough, unspecified: Secondary | ICD-10-CM | POA: Insufficient documentation

## 2013-09-27 DIAGNOSIS — H10029 Other mucopurulent conjunctivitis, unspecified eye: Secondary | ICD-10-CM | POA: Insufficient documentation

## 2013-09-27 DIAGNOSIS — C773 Secondary and unspecified malignant neoplasm of axilla and upper limb lymph nodes: Secondary | ICD-10-CM | POA: Insufficient documentation

## 2013-09-27 HISTORY — DX: Malignant neoplasm of unspecified site of unspecified female breast: C50.919

## 2013-09-27 NOTE — Progress Notes (Signed)
Radiation Oncology         [phone]) [phone] ________________________________  Name: Joan Valdez MRN: [id number]  Date: 09/27/2013  DOB: [birth year]  VO:[id number] TOM, MD  Susy Frizzle, MD   Marcy Panning, MD  REFERRING PHYSICIAN: Susy Frizzle, MD   DIAGNOSIS: The encounter diagnosis was Breast cancer of lower-outer quadrant of right female breast.    HISTORY OF PRESENT ILLNESS::Joan Valdez is a 43 y.o. female who is seen for an initial consultation visit. The patient was found to have a suspicious finding on recent screening mammogram involving would appear to represent a fairly large mass at the 6:00 position approximately within the right breast. This was present within the lower outer quadrant and represented a 3 cm primary mass with an additional 2 cm of calcifications.  This prompted further workup and an enlarged lymph node in the right axilla was also noticed which was suspicious for involvement. The patient proceeded to undergo a biopsy of the dominant right-sided breast mass as well as suspicious lymph nodes. 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 Receptor studies have indicated that the tumor is ER positive and PR positive. The tumor also is HER-2/neu positive. The proliferative index was 72%.  The patient proceeded to undergo an MRI scan of the breasts bilaterally. The biopsy-proven malignancy in the lower outer quadrant of the right breast was seen with enhancement and a likely small satellite nodules superiorly. The total extent of the disease measured up to 8.2 cm. Abnormal lymph node was also seen in the right axilla which was felt to be compatible to the biopsy-proven positive lymph nodes at this location. An indeterminate area of non-mass enhancement was also present in the left breast for which further workup was recommended. The patient did proceed with a biopsy of this location as well on 12 early 2014. This showed fibrocystic changes  without evidence of malignancy.  Additional workup has included a CT scan of the chest abdomen and pelvis. This did not reveal any evidence of distant metastatic disease. PET scan was also performed on this day, 09/08/2013. Hypermetabolic activity was seen corresponding to the lateral right breast mass as well as the right axillary lymph node. No evidence of distant metastatic disease.  The patient has been seen by both surgery and medical oncology.  The patient has been determined to be a good candidate for neoadjuvant chemotherapy. The patient has completed one cycle of TCH/perjeta. She states that this has gone relatively well. She has developed a cough and most notably an acute (I. which is irritated beginning yesterday. No change in vision. There was some drainage which she noted last night.   PREVIOUS RADIATION THERAPY: No   PAST MEDICAL HISTORY:  has a past medical history of PCO (polycystic ovaries); Hypothyroid; Allergic rhinitis; Chronic headache; Acid reflux; Cervical dysplasia; Stone, kidney; Cancer; and Breast cancer (08/16/13).     PAST SURGICAL HISTORY: Past Surgical History  Procedure Laterality Date  . Cesarean section  2001  . Colposcopy    . Cervical biopsy  w/ loop electrode excision  2002  . Eye surgery      Hx: of metal fragment removed  . Portacath placement N/A 09/04/2013    Procedure: INSERTION PORT-A-CATH;  Surgeon: Merrie Roof, MD;  Location: [facility];  Service: General;  Laterality: N/A;     FAMILY HISTORY: family history includes Diabetes in her paternal grandmother; Heart disease in her maternal grandmother; Hypertension in her mother and paternal grandmother; Migraines in  her mother; Rheum arthritis in her paternal grandmother.   SOCIAL HISTORY:  reports that she has never smoked. She has never used smokeless tobacco. She reports that she drinks about 1.0 ounces of alcohol per week. She reports that she does not use illicit drugs.   ALLERGIES: Imitrex;  Amoxicillin; Bactrim; Cephalexin; Erythromycin; Penicillins; Strawberry; Sulfa antibiotics; and Zithromax   MEDICATIONS:  Current Outpatient Prescriptions  Medication Sig Dispense Refill  . Alum & Mag Hydroxide-Simeth (MAGIC MOUTHWASH W/LIDOCAINE) SOLN 5-72m q4hrs prn. Swish and spit, may swallow for sore throat.  480 mL  3  . cetirizine (ZYRTEC) 10 MG tablet Take 10 mg by mouth at bedtime as needed.       .Marland Kitchendexamethasone (DECADRON) 4 MG tablet Take 2 tablets (8 mg total) by mouth 2 (two) times daily with a meal. Take two times a day the day before Taxotere. Then take two times a day starting the day after chemo for 3 days.  30 tablet  1  . dexlansoprazole (DEXILANT) 60 MG capsule Take 60 mg by mouth daily.      .Marland Kitchendihydroergotamine (MIGRANAL) 4 MG/ML nasal spray Place 1 spray into the nose as needed for migraine. Use in one nostril as directed.  No more than 4 sprays in one hour      . Eflornithine HCl (VANIQA) 13.9 % cream Apply pea-sized amount as directed  30 g  5  . ibuprofen (ADVIL,MOTRIN) 200 MG tablet Take 400 mg by mouth every 8 (eight) hours as needed. For pain      . levothyroxine (SYNTHROID, LEVOTHROID) 100 MCG tablet Take 100 mcg by mouth daily.       .Marland Kitchenlidocaine-prilocaine (EMLA) cream Apply topically as needed.  30 g  6  . liothyronine (CYTOMEL) 25 MCG tablet Take 12.5 mcg by mouth 2 (two) times daily.       .Marland KitchenLORazepam (ATIVAN) 0.5 MG tablet Take 1 tablet (0.5 mg total) by mouth every 6 (six) hours as needed (Nausea or vomiting).  30 tablet  0  . mometasone (NASONEX) 50 MCG/ACT nasal spray Place 2 sprays into both nostrils daily.       . ranitidine (ZANTAC) 300 MG tablet Take 300 mg by mouth at bedtime.      . topiramate (TOPAMAX) 100 MG tablet Take 200 mg by mouth at bedtime.      . Nutritional Supplements (JUICE PLUS FIBRE PO) Take 1 tablet by mouth daily.      . ondansetron (ZOFRAN) 8 MG tablet Take 1 tablet (8 mg total) by mouth 2 (two) times daily. Take two times a day  starting the day after chemo for 3 days. Then take two times a day as needed for nausea or vomiting.  30 tablet  1  . prochlorperazine (COMPAZINE) 10 MG tablet Take 1 tablet (10 mg total) by mouth every 6 (six) hours as needed (Nausea or vomiting).  30 tablet  1   No current facility-administered medications for this encounter.     REVIEW OF SYSTEMS:  A 15 point review of systems is documented in the electronic medical record. This was obtained by the nursing staff. However, I reviewed this with the patient to discuss relevant findings and make appropriate changes.  Pertinent items are noted in HPI.    PHYSICAL EXAM:  weight is 206 lb 1.6 oz (93.486 kg). Her oral temperature is 98.2 F (36.8 C). Her blood pressure is 112/71 and her pulse is 117. Her respiration is 20 and oxygen saturation  is 97%.   ECOG = 0  0 - Asymptomatic (Fully active, able to carry on all predisease activities without restriction)  1 - Symptomatic but completely ambulatory (Restricted in physically strenuous activity but ambulatory and able to carry out work of a light or sedentary nature. For example, light housework, office work)  2 - Symptomatic, <50% in bed during the day (Ambulatory and capable of all self care but unable to carry out any work activities. Up and about more than 50% of waking hours)  3 - Symptomatic, >50% in bed, but not bedbound (Capable of only limited self-care, confined to bed or chair 50% or more of waking hours)  4 - Bedbound (Completely disabled. Cannot carry on any self-care. Totally confined to bed or chair)  5 - Death   Eustace Pen MM, Creech RH, Tormey DC, et al. [phone]). "Toxicity and response criteria of the [facility]". [place] Oncol. 5 (6): 649-55  General: Well-developed, in no acute distress HEENT: Normocephalic, atraumatic; oral cavity clear; conjunctivae was present involving the left eye. 9 on the right. No corneal involvement. No active drainage. Pupils  equal round and reactive to light Neck: Supple without any lymphadenopathy Cardiovascular: Regular rate and rhythm Respiratory: Clear to auscultation bilaterally Breasts:  Palpable breast mass present within the lower right breast somewhat laterally. I cannot appreciate today on exam a discrete right axillary lymph node. Left breast exam is status post biopsy without concerning findings. No left-sided axillary adenopathy. GI: Soft, nontender, normal bowel sounds Extremities: No edema present Neuro: No focal deficits     LABORATORY DATA:  Lab Results  Component Value Date   WBC 7.2 09/25/2013   HGB 16.4* 09/25/2013   HCT 47.2* 09/25/2013   MCV 86.8 09/25/2013   PLT 130* 09/25/2013   Lab Results  Component Value Date   NA 139 09/25/2013   K 3.2* 09/25/2013   CL 105 08/30/2013   CO2 24 09/25/2013   Lab Results  Component Value Date   ALT 14 09/25/2013   AST 14 09/25/2013   ALKPHOS 123 09/25/2013   BILITOT 0.68 09/25/2013      RADIOGRAPHY: Ct Chest W Contrast  09/08/2013   CLINICAL DATA:  Right breast cancer.  EXAM: CT CHEST,ABDOMEN AND PELVIS WITH CONTRAST  TECHNIQUE: Multidetector CT imaging of the chest, abdomen and pelvis was performed during intravenous contrast administration.  CONTRAST:  128m OMNIPAQUE IOHEXOL 300 MG/ML  SOLN  COMPARISON:  PET 09/08/2013.  IMPRESSION: Right breast mass and right axillary adenopathy, consistent with the given history of breast carcinoma. No evidence of distant metastatic disease.  CT CHEST FINDINGS: Mediastinal lymph nodes are not enlarged by CT size criteria. No hilar or left axillary lymph nodes. A somewhat hazy appearing lymph node in the right axilla and measures 1.3 cm. Irregular mass in the lateral right breast measures approximately 2.0 x 2.4 cm. Heart size normal. No pericardial effusion. Left subclavian Port-A-Cath terminates at the SVC RA junction.  Lungs are clear.  No pleural fluid.  Airway is unremarkable.  CT ABDOMEN PELVIS FINDINGS:  Liver, gallbladder and adrenal glands are unremarkable. Sub cm low-attenuation lesion in the interpolar right kidney is too small to characterize. Statistically, a cyst is likely. Spleen, pancreas, stomach and bowel are unremarkable. Uterus and ovaries are visualized. No pathologically enlarged lymph nodes. No free fluid. Bladder is unremarkable. No worrisome lytic or sclerotic lesions.   Electronically Signed   By: MLorin PicketM.D.   On: 09/08/2013 11:16  Ct Abdomen Pelvis W Contrast  09/08/2013   CLINICAL DATA:  Right breast cancer.  EXAM: CT CHEST,ABDOMEN AND PELVIS WITH CONTRAST  TECHNIQUE: Multidetector CT imaging of the chest, abdomen and pelvis was performed during intravenous contrast administration.  CONTRAST:  165m OMNIPAQUE IOHEXOL 300 MG/ML  SOLN  COMPARISON:  PET 09/08/2013.  IMPRESSION: Right breast mass and right axillary adenopathy, consistent with the given history of breast carcinoma. No evidence of distant metastatic disease.  CT CHEST FINDINGS: Mediastinal lymph nodes are not enlarged by CT size criteria. No hilar or left axillary lymph nodes. A somewhat hazy appearing lymph node in the right axilla and measures 1.3 cm. Irregular mass in the lateral right breast measures approximately 2.0 x 2.4 cm. Heart size normal. No pericardial effusion. Left subclavian Port-A-Cath terminates at the SVC RA junction.  Lungs are clear.  No pleural fluid.  Airway is unremarkable.  CT ABDOMEN PELVIS FINDINGS: Liver, gallbladder and adrenal glands are unremarkable. Sub cm low-attenuation lesion in the interpolar right kidney is too small to characterize. Statistically, a cyst is likely. Spleen, pancreas, stomach and bowel are unremarkable. Uterus and ovaries are visualized. No pathologically enlarged lymph nodes. No free fluid. Bladder is unremarkable. No worrisome lytic or sclerotic lesions.   Electronically Signed   By: MLorin PicketM.D.   On: 09/08/2013 11:16   Nm Pet Image Initial (pi) Skull  Base To Thigh  09/08/2013   CLINICAL DATA:  Initial treatment strategy for breast cancer.  EXAM: NUCLEAR MEDICINE PET SKULL BASE TO THIGH  FASTING BLOOD GLUCOSE:  Value: 931mdl  TECHNIQUE: 16.3 mCi F-18 FDG was injected intravenously. CT data was obtained and used for attenuation correction and anatomic localization only. (This was not acquired as a diagnostic CT examination.) Additional exam technical data entered on technologist worksheet.  COMPARISON:  CT chest, abdomen and pelvis 09/08/2013 and breast MR 08/25/2013.  FINDINGS: NECK  Mild hypermetabolism in the right tonsillar region may be inflammatory. No additional areas of abnormal hypermetabolism in the neck.  CT images show no acute findings. Visualized portions of the paranasal sinuses and mastoid air cells are clear.  CHEST  An irregular mass in the right breast measures 2.2 x 2.5 cm with an SUV max of 7.1. Right axillary lymph node measures 1.3 cm (CT image 70) with an SUV max of 5.0. No additional areas of abnormal hypermetabolism in the chest.  CT images show a left subclavian Port-A-Cath terminating at the SVC RA junction. Heart size within normal limits. No pericardial or pleural effusion. Lungs are grossly clear. Airway is unremarkable.  ABDOMEN/PELVIS  No abnormal hypermetabolic activity within the liver, pancreas, adrenal glands or spleen. No hypermetabolic lymph nodes.  CT images show no acute findings in the liver, gallbladder, adrenal glands, kidneys, spleen, pancreas, stomach or bowel. Uterus and ovaries are visualized. Bladder is decompressed. No free fluid.  SKELETON  No focal hypermetabolic activity to suggest skeletal metastasis.  IMPRESSION: FDG-avid lateral right breast mass and right axillary lymph node. No evidence of distant metastatic disease.   Electronically Signed   By: MeLorin Picket.D.   On: 09/08/2013 11:16   Dg Chest Port 1 View  09/04/2013   CLINICAL DATA:  Port-A-Cath placement  EXAM: PORTABLE CHEST - 1 VIEW   COMPARISON:  Portable exam 1638 hr compared to 12/12/2012  FINDINGS: Left subclavian Port-A-Cath with tip projecting over mid SVC.  Normal heart size, mediastinal contours and pulmonary vascularity for technique.  Lungs clear.  No pleural effusion or pneumothorax.  Bones unremarkable.  IMPRESSION: No acute abnormalities.   Electronically Signed   By: Lavonia Dana M.D.   On: 09/04/2013 16:45   Mm Digital Diagnostic Unilat L  09/12/2013   CLINICAL DATA:  Status post MRI guided left breast biopsy.  EXAM: DIAGNOSTIC LEFT MAMMOGRAM POST ULTRASOUND BIOPSY  COMPARISON:  Previous exams  FINDINGS: Mammographic images were obtained following MRI guided biopsy of left breast. Biopsy marking clip in appropriate position 6 o'clock left breast posterior depth.  IMPRESSION: Appropriate position left breast biopsy marking clip status post MRI guided core needle biopsy.  Final Assessment: Post Procedure Mammograms for Marker Placement   Electronically Signed   By: Lovey Newcomer M.D.   On: 09/12/2013 11:08   Dg Fluoro Guide Cv Line-no Report  09/04/2013   CLINICAL DATA: needed for case   FLOURO GUIDE CV LINE  Fluoroscopy was utilized by the requesting physician.  No radiographic  interpretation.    Mr Aundra Millet Breast Bx Johnella Moloney Dev 1st Lesion Image Bx Spec Mr Guide  09/13/2013   ADDENDUM REPORT: 09/13/2013 12:57  ADDENDUM: I spoke with patient by telephone on September 13, 2013 to discuss pathology results. Pathology demonstrates benign fibrocystic change. Patient has no complaints at the biopsy site.  Recommendations:  Followup breast MRI in 6 months.  Treatment plan for known right breast malignancy.   Electronically Signed   By: Lovey Newcomer M.D.   On: 09/13/2013 12:57   09/13/2013   CLINICAL DATA:  Recently diagnosed right breast carcinoma and right axillary metastatic disease. Status post diagnostic MRI with non mass enhancement in the left breast.  EXAM: MRI GUIDED CORE NEEDLE BIOPSY OF THE LEFT BREAST  TECHNIQUE: Multiplanar,  multisequence MR imaging of the left breast was performed both before and after administration of intravenous contrast.  CONTRAST:  80m MULTIHANCE GADOBENATE DIMEGLUMINE 529 MG/ML IV SOLN  COMPARISON:  Previous exams.  FINDINGS: I met with the patient, and we discussed the procedure of MRI guided biopsy, including risks, benefits, and alternatives. Specifically, we discussed the risks of infection, bleeding, tissue injury, clip migration, and inadequate sampling. Informed, written consent was given. The usual time out protocol was performed immediately prior to the procedure.  Using sterile technique, 2% Lidocaine, MRI guidance, and a 9 gauge vacuum assisted device, biopsy was performed of non mass enhancement within the left breast 6 o'clock position using a lateral approach. At the conclusion of the procedure, a dumbbell shaped tissue marker clip was deployed into the biopsy cavity. Follow-up 2-view mammogram was performed and dictated separately.  IMPRESSION: MRI guided biopsy of left breast non mass enhancement 6 o'clock position. No apparent complications.  Electronically Signed: By: DLovey NewcomerM.D. On: 09/12/2013 11:11       IMPRESSION: The patient has a new diagnosis of invasive ductal carcinoma of the left breast, T3, N1, M0. She is proceeding with neoadjuvant chemotherapy which will be followed by surgical resection. Based on her initial presentation this likely will be a mastectomy although the response after systemic treatment will be evaluated.  The patient I believe is an appropriate candidate for post operative adjuvant radiation treatment. Based on her initial presentation, this would be the recommendation regardless of response and also regardless of surgery performed. I therefore discussed with her that I would recommend aggressive multimodality treatment for her case including postmastectomy radiation.  We discussed a potential 6-1/2 week course of radiation treatment therefore. We  discussed the benefit in terms of local/regional control. We also discussed the typical  side effects and risks of treatment. All of her questions were answered. She does wish to proceed with this treatment plan.   PLAN: I will see the patient back after surgery to review her results at that time and to further discuss and coordinate an anticipated course of adjuvant radiation treatment.   With regards to the patient's pink eye, this likely represents viral conjunctivitis. I gave her a patient handout regarding this in terms of management and we also discussed the expected course of improvement and potential reasons for her to be seen by her primary care doctor on an urgent basis if necessary.     I spent 60 minutes face to face with the patient and more than 50% of that time was spent in counseling and/or coordination of care.    ________________________________   Jodelle Gross, MD, PhD

## 2013-10-02 ENCOUNTER — Ambulatory Visit (HOSPITAL_BASED_OUTPATIENT_CLINIC_OR_DEPARTMENT_OTHER): Payer: BC Managed Care – PPO | Admitting: Hematology and Oncology

## 2013-10-02 ENCOUNTER — Other Ambulatory Visit (HOSPITAL_BASED_OUTPATIENT_CLINIC_OR_DEPARTMENT_OTHER): Payer: BC Managed Care – PPO

## 2013-10-02 ENCOUNTER — Ambulatory Visit (HOSPITAL_BASED_OUTPATIENT_CLINIC_OR_DEPARTMENT_OTHER): Payer: BC Managed Care – PPO

## 2013-10-02 VITALS — BP 136/90 | HR 118 | Temp 99.2°F | Resp 20 | Ht 62.0 in | Wt 208.1 lb

## 2013-10-02 DIAGNOSIS — C773 Secondary and unspecified malignant neoplasm of axilla and upper limb lymph nodes: Secondary | ICD-10-CM

## 2013-10-02 DIAGNOSIS — R21 Rash and other nonspecific skin eruption: Secondary | ICD-10-CM

## 2013-10-02 DIAGNOSIS — C50919 Malignant neoplasm of unspecified site of unspecified female breast: Secondary | ICD-10-CM

## 2013-10-02 DIAGNOSIS — C50519 Malignant neoplasm of lower-outer quadrant of unspecified female breast: Secondary | ICD-10-CM

## 2013-10-02 DIAGNOSIS — E860 Dehydration: Secondary | ICD-10-CM

## 2013-10-02 DIAGNOSIS — K121 Other forms of stomatitis: Secondary | ICD-10-CM

## 2013-10-02 DIAGNOSIS — I10 Essential (primary) hypertension: Secondary | ICD-10-CM

## 2013-10-02 DIAGNOSIS — C50511 Malignant neoplasm of lower-outer quadrant of right female breast: Secondary | ICD-10-CM

## 2013-10-02 DIAGNOSIS — R000 Tachycardia, unspecified: Secondary | ICD-10-CM

## 2013-10-02 DIAGNOSIS — Z170 Estrogen receptor positive status [ER+]: Secondary | ICD-10-CM

## 2013-10-02 DIAGNOSIS — K1230 Oral mucositis (ulcerative), unspecified: Secondary | ICD-10-CM

## 2013-10-02 DIAGNOSIS — D6959 Other secondary thrombocytopenia: Secondary | ICD-10-CM

## 2013-10-02 LAB — COMPREHENSIVE METABOLIC PANEL (CC13)
ALBUMIN: 3.2 g/dL — AB (ref 3.5–5.0)
ALT: 82 U/L — ABNORMAL HIGH (ref 0–55)
ANION GAP: 11 meq/L (ref 3–11)
AST: 26 U/L (ref 5–34)
Alkaline Phosphatase: 108 U/L (ref 40–150)
BUN: 8.7 mg/dL (ref 7.0–26.0)
CO2: 21 mEq/L — ABNORMAL LOW (ref 22–29)
Calcium: 9.2 mg/dL (ref 8.4–10.4)
Chloride: 111 mEq/L — ABNORMAL HIGH (ref 98–109)
Creatinine: 0.8 mg/dL (ref 0.6–1.1)
GLUCOSE: 143 mg/dL — AB (ref 70–140)
POTASSIUM: 3.4 meq/L — AB (ref 3.5–5.1)
SODIUM: 142 meq/L (ref 136–145)
TOTAL PROTEIN: 6.7 g/dL (ref 6.4–8.3)
Total Bilirubin: 0.39 mg/dL (ref 0.20–1.20)

## 2013-10-02 LAB — CBC WITH DIFFERENTIAL/PLATELET
BASO%: 1 % (ref 0.0–2.0)
Basophils Absolute: 0.1 10*3/uL (ref 0.0–0.1)
EOS ABS: 0 10*3/uL (ref 0.0–0.5)
EOS%: 0.1 % (ref 0.0–7.0)
HCT: 40.8 % (ref 34.8–46.6)
HGB: 14.1 g/dL (ref 11.6–15.9)
LYMPH#: 1.8 10*3/uL (ref 0.9–3.3)
LYMPH%: 27.1 % (ref 14.0–49.7)
MCH: 30.5 pg (ref 25.1–34.0)
MCHC: 34.5 g/dL (ref 31.5–36.0)
MCV: 88.4 fL (ref 79.5–101.0)
MONO#: 0.4 10*3/uL (ref 0.1–0.9)
MONO%: 6.1 % (ref 0.0–14.0)
NEUT%: 65.7 % (ref 38.4–76.8)
NEUTROS ABS: 4.4 10*3/uL (ref 1.5–6.5)
Platelets: 83 10*3/uL — ABNORMAL LOW (ref 145–400)
RBC: 4.62 10*6/uL (ref 3.70–5.45)
RDW: 13.1 % (ref 11.2–14.5)
WBC: 6.6 10*3/uL (ref 3.9–10.3)

## 2013-10-02 MED ORDER — NYSTATIN 100000 UNIT/ML MT SUSP: 5.0000 mL | Freq: Four times a day (QID) | OROMUCOSAL | Status: DC

## 2013-10-02 MED ORDER — SODIUM CHLORIDE 0.45 % IV SOLN: 2000.0000 mL | INTRAVENOUS | Status: DC

## 2013-10-02 MED ORDER — SODIUM CHLORIDE 0.45 % IV SOLN
INTRAVENOUS | Status: DC
  Administered 2013-10-02: 12:00:00 via INTRAVENOUS
  Filled 2013-10-02: qty 1000

## 2013-10-02 MED ORDER — SODIUM CHLORIDE 0.9 % IJ SOLN
10.0000 mL | INTRAMUSCULAR | Status: DC | PRN
  Administered 2013-10-02: 10 mL via INTRAVENOUS
  Filled 2013-10-02: qty 10

## 2013-10-02 MED ORDER — SODIUM CHLORIDE 0.45 % IV SOLN
Freq: Once | INTRAVENOUS | Status: AC
  Administered 2013-10-02: 14:00:00 via INTRAVENOUS
  Filled 2013-10-02: qty 1000

## 2013-10-02 MED ORDER — HEPARIN SOD (PORK) LOCK FLUSH 100 UNIT/ML IV SOLN
500.0000 [IU] | Freq: Once | INTRAVENOUS | Status: AC
  Administered 2013-10-02: 500 [IU] via INTRAVENOUS
  Filled 2013-10-02: qty 5

## 2013-10-02 NOTE — Progress Notes (Signed)
Joan Valdez [id number] 43 y.o. 10/02/2013 11:21 AM  CC  [name], MD [address] [place] [address] Dr. Eddie Dibbles toth  Diagnosis:  43 year old female with new diagnosis of triple positive invasive ductal carcinoma of the right breast diagnosed 08/16/2013  STAGE:  Breast cancer of lower-outer quadrant of right female breast   Primary site: Breast (Right)   Staging method: AJCC 7th Edition   Clinical: Stage IIIA (T3, N1, cM0) signed by Deatra Robinson, MD on 09/21/2013  6:07 PM   Summary: Stage IIIA (T3, N1, cM0)  Current therapy: s/p TCH/perjeta cycle 1 day 1 chemotherapy completed on 09/19/2012  Chief complaint: Joan Valdez is here today with the complaints of skin rash and not feeling well  Past oncologic hisotry:  Joan Valdez is a 43 y.o. female.  Would medical history significant for hypothyroidism gastroesophageal reflux disease and migraine headaches.patient had a routine screening mammogram performed she was noted to have a large mass in the 6:00 position of the right breast measuring 3 cm for the primary mass with another 2 cm of calcifications anteriorly. She was also noted to have a enlarged lymph node in the right axilla. Patient underwent a needle core biopsy of the primary mass as well as axilla. The tumor was ER positive PR positive HER-2 positive with a proliferation marker Ki-67 59%. Patient had MRI of the breasts performed MRI showed right mass to measure 8.2 x 5.6 x 3.1 cm lymph node measured 2.5 cm on the left breast there was a 9 millimeter area of concern.   Interval history:   Arita received on Zoladex injection during the last visit on  09/25/2012. She developed skin rash on both hands and also on the face since Saturday. She says that it is burning and itching. She says that she is able to take soup and not able to eat well because of  mouth pain.  denies any fever, shortness of breath, chest pain, palpitations, constipation, blood  in the stool or  blood in the urine. denies any diarrhea.   Past Medical History: Past Medical History  Diagnosis Date  . PCO (polycystic ovaries)   . Hypothyroid   . Allergic rhinitis   . Chronic headache   . Acid reflux   . Cervical dysplasia   . Stone, kidney     Hx: of  . Cancer     ;Breast cancer; pre-cancerous cervical cells  . Breast cancer 08/16/13    invasive ductal ca,DCIS,     Past Surgical History: Past Surgical History  Procedure Laterality Date  . Cesarean section  2001  . Colposcopy    . Cervical biopsy  w/ loop electrode excision  2002  . Eye surgery      Hx: of metal fragment removed  . Portacath placement N/A 09/04/2013    Procedure: INSERTION PORT-A-CATH;  Surgeon: Merrie Roof, MD;  [facility] OR;  Service: General;  Laterality: N/A;    Family History: Family History  Problem Relation Age of Onset  . Hypertension Mother   . Migraines Mother   . Diabetes Paternal Grandmother   . Hypertension Paternal Grandmother   . Rheum arthritis Paternal Grandmother   . Heart disease Maternal Grandmother     Social History History  Substance Use Topics  . Smoking status: Never Smoker   . Smokeless tobacco: Never Used  . Alcohol Use: 1.0 oz/week    2 drink(s) per week     Comment: socially.  Allergies: Allergies  Allergen Reactions  . Imitrex [Sumatriptan Base] Anaphylaxis  . Amoxicillin Hives  . Bactrim Hives  . Cephalexin Hives  . Erythromycin Hives  . Penicillins Hives  . Strawberry Hives  . Sulfa Antibiotics Hives  . Zithromax [Azithromycin Dihydrate] Hives    Current Medications: Current Outpatient Prescriptions  Medication Sig Dispense Refill  . Alum & Mag Hydroxide-Simeth (MAGIC MOUTHWASH W/LIDOCAINE) SOLN 5-106ml q4hrs prn. Swish and spit, may swallow for sore throat.  480 mL  3  . dexlansoprazole (DEXILANT) 60 MG capsule Take 60 mg by mouth daily.      . Eflornithine HCl (VANIQA) 13.9 % cream Apply pea-sized amount as directed   30 g  5  . levothyroxine (SYNTHROID, LEVOTHROID) 100 MCG tablet Take 100 mcg by mouth daily.       Marland Kitchen lidocaine-prilocaine (EMLA) cream Apply topically as needed.  30 g  6  . liothyronine (CYTOMEL) 25 MCG tablet Take 12.5 mcg by mouth 2 (two) times daily.       Marland Kitchen LORazepam (ATIVAN) 0.5 MG tablet Take 1 tablet (0.5 mg total) by mouth every 6 (six) hours as needed (Nausea or vomiting).  30 tablet  0  . ranitidine (ZANTAC) 300 MG tablet Take 300 mg by mouth at bedtime.      . topiramate (TOPAMAX) 100 MG tablet Take 200 mg by mouth at bedtime.      . cetirizine (ZYRTEC) 10 MG tablet Take 10 mg by mouth at bedtime as needed.       Marland Kitchen dexamethasone (DECADRON) 4 MG tablet Take 2 tablets (8 mg total) by mouth 2 (two) times daily with a meal. Take two times a day the day before Taxotere. Then take two times a day starting the day after chemo for 3 days.  30 tablet  1  . dihydroergotamine (MIGRANAL) 4 MG/ML nasal spray Place 1 spray into the nose as needed for migraine. Use in one nostril as directed.  No more than 4 sprays in one hour      . ibuprofen (ADVIL,MOTRIN) 200 MG tablet Take 400 mg by mouth every 8 (eight) hours as needed. For pain      . mometasone (NASONEX) 50 MCG/ACT nasal spray Place 2 sprays into both nostrils daily.       . Nutritional Supplements (JUICE PLUS FIBRE PO) Take 1 tablet by mouth daily.      Marland Kitchen nystatin (MYCOSTATIN) 100000 UNIT/ML suspension Take 5 mLs (500,000 Units total) by mouth 4 (four) times daily.  60 mL  0  . ondansetron (ZOFRAN) 8 MG tablet Take 1 tablet (8 mg total) by mouth 2 (two) times daily. Take two times a day starting the day after chemo for 3 days. Then take two times a day as needed for nausea or vomiting.  30 tablet  1  . prochlorperazine (COMPAZINE) 10 MG tablet Take 1 tablet (10 mg total) by mouth every 6 (six) hours as needed (Nausea or vomiting).  30 tablet  1  . sodium chloride 0.45 % SOLN 1,000 mL with potassium chloride 2 mEq/ml SOLN 10 mEq Inject 2,000 mLs  into the vein continuous.  2000 mL  0   No current facility-administered medications for this visit.    OB/GYN History:menarche at age 62 she is premenopausal last menstrual period was on 08/11/2013. First live birth at 22.  Fertility Discussion:patient has completed her family Prior History of Cancer: no  Health Maintenance:  Colonoscopy no Bone Densityno Last PAP smearup-to-date  ECOG PERFORMANCE STATUS:  1 - Symptomatic but completely ambulatory  Genetic Counseling/testing: yes  REVIEW OF SYSTEMS:  All 14 review of systems has been assessed and positive findings are mentioned in Interval history  EXAMINATION: Blood pressure 136/90, pulse 118, temperature 99.2 F (37.3 C), temperature source Oral, resp. rate 20, height $RemoveBe'5\' 2"'rELwATroq$  (1.575 m), weight 208 lb 1.6 oz (94.394 kg), last menstrual period 09/06/2013. repeat pulse rate 110 per minute   GENERAL: healthy, no distress, well nourished and well developed SKIN: skin color, texture, turgor are normal HEAD: Normocephalic EYES: PERRLA, EOMI, Conjunctiva are pink and non-injected EARS: External ears normal OROPHARYNX: Oral mucositis noted. No ulcers noticed.  NECK: no adenopathy LYMPH:  no hepatosplenomegaly BREAST:left breast normal without mass, skin or nipple changes or axillary nodes, right breast and right axilla palpable masses there is erythema ecchymosis secondary to the biopsy LUNGS: clear to auscultation and percussion HEART: regular rate & rhythm ABDOMEN:abdomen soft, non-tender, normal bowel sounds and no masses or organomegaly BACK: No CVA tenderness EXTREMITIES:less then 2 second capillary refill, no edema, no clubbing, no cyanosis  NEURO: alert & oriented x 3 with fluent speech, no focal motor/sensory deficits, gait normal Skin examination: Dry macular skin and skin rash noticed on both hands, face. No rash noted on the feet.     STUDIES/RESULTS: US Breast Right  08/16/2013   CLINICAL DATA:  Patient returns for  evaluation of a mass in the right breast noted on recent screening study dated 07/19/2013.  EXAM: DIGITAL DIAGNOSTIC  RIGHT LIMITED MAMMOGRAM  ULTRASOUND RIGHT BREAST  COMPARISON:  10/21/2007  ACR Breast Density Category b: There are scattered areas of fibroglandular density.  FINDINGS: Additional views confirm the presence of a spiculated mass with microcalcifications in the 6 o'clock position of the right breast posteriorly. This measures at least 4.2 x 3.9 x 2.8 cm mammographically. There are highly suspicious pleomorphic microcalcifications both within the mass and extending anteriorly from the mass towards the nipple. In addition, there are grouped calcifications superior and lateral to the mass which cover a length of approximately 18 mm.  On physical exam, I palpate a 3-4 cm mass at 6 o'clock 5 cm from the right nipple.  Ultrasound is performed, showing an irregular hypoechoic mass with microcalcifications at 6 o'clock 5 cm from the right nipple. This measures at least 2.0 x 1.9 x 3.0 cm. It has a hyperechoic halo. There is a hypoechoic tubular structure with microcalcifications extending a length of 1.7 cm from the superior margin the mass towards the nipple. Sonography of the right axilla demonstrates a lymph node with a markedly thickened cortex.  The appearance is highly suspicious for invasive mammary carcinoma with associated ductal carcinoma and axillary metastasis. Ultrasound-guided core needle biopsy of the mass is suggested today. If carcinoma is confirmed, MRI could be performed to assess for any additional areas that may need to be biopsied if breast conservation is desired.  IMPRESSION: Highly suspicious mass with microcalcifications an abnormal right axillary lymph node as described above.  RECOMMENDATION: Ultrasound-guided core needle biopsy of the mass is recommended and will be performed and reported separately.  I have discussed the findings and recommendations with the patient. Results were  also provided in writing at the conclusion of the visit. If applicable, a reminder letter will be sent to the patient regarding the next appointment.  Report was telephoned to [facility] at Dr. Zelphia Cairo office.  BI-RADS CATEGORY  5: Highly suggestive of malignancy - appropriate action should be taken.   Electronically Signed  By: Ulyess Blossom M.D.   On: 08/16/2013 10:41   Mr Breast Bilateral W Wo Contrast  08/25/2013   CLINICAL DATA:  43 year old female with recently diagnosed invasive ductal carcinoma of the right breast with right axillary lymph node metastases.  EXAM: BILATERAL BREAST MRI WITH AND WITHOUT CONTRAST  LABS:  No recent labs.  TECHNIQUE: Multiplanar, multisequence MR images of both breasts were obtained prior to and following the intravenous administration of 11ml of MultiHance.  THREE-DIMENSIONAL MR IMAGE RENDERING ON INDEPENDENT WORKSTATION:  Three-dimensional MR images were rendered by post-processing of the original MR data on an independent workstation. The three-dimensional MR images were interpreted, and findings are reported in the following complete MRI report for this study. Three dimensional images were evaluated at the independent DynaCad workstation  COMPARISON:  Previous exams  FINDINGS: Breast composition: c:  Heterogeneous fibroglandular tissue  Background parenchymal enhancement: Mild.  Right breast: Large irregular spiculated mass in the lower outer quadrant of the right breast with contiguous linear clumped non mass enhancement extending posteriorly consistent with known biopsy proven malignancy measures approximately 8.2 cm AP, 5.6 cm transverse, and 3.1 cm craniocaudal. Biopsy clip artifact is present along the inferior margin of the dominant mass. A probable satellite nodule just superior to the dominant mass measures approximately 6 mm. No findings to suggest skin, nipple, or pectoralis muscle involvement.  Left breast: There is linear non mass enhancement in the  inferior left breast at the approximate 6 o'clock position mid to posterior depth measuring approximately 9 mm AP. No abnormal skin, nipple, or pectoralis enhancement.  Lymph nodes: Enlarged and morphologically abnormal lymph node in the right axilla measures 2.5 x 1.2 cm, compatible with known right axillary nodal metastases. No internal mammary lymphadenopathy seen.  Ancillary findings:  None.  IMPRESSION: 1. Biopsy proven malignancy in the lower outer right breast with contiguous linear clumped enhancement extending posteriorly and a probable small satellite nodule present superiorly, with total extent of disease measuring up to 8.2 cm AP. Morphologically abnormal lymph node in the right axilla, compatible with biopsy proven nodal metastases.  2. Indeterminate linear non mass enhancement in the inferior left breast at the approximate 6 o'clock position mid to posterior depth measuring approximately 9 mm.  RECOMMENDATION: 1. MRI guided biopsy of the indeterminate linear non mass enhancement in the inferior left breast is recommended.  2.  Treatment plan for known right breast malignancy.  BI-RADS CATEGORY  4: Suspicious abnormality - biopsy should be considered.   Electronically Signed   By: Everlean Alstrom M.D.   On: 08/25/2013 14:02   Mm Spur R  08/16/2013   CLINICAL DATA:  Patient returns for evaluation of a mass in the right breast noted on recent screening study dated 07/19/2013.  EXAM: DIGITAL DIAGNOSTIC  RIGHT LIMITED MAMMOGRAM  ULTRASOUND RIGHT BREAST  COMPARISON:  10/21/2007  ACR Breast Density Category b: There are scattered areas of fibroglandular density.  FINDINGS: Additional views confirm the presence of a spiculated mass with microcalcifications in the 6 o'clock position of the right breast posteriorly. This measures at least 4.2 x 3.9 x 2.8 cm mammographically. There are highly suspicious pleomorphic microcalcifications both within the mass and extending anteriorly from the mass  towards the nipple. In addition, there are grouped calcifications superior and lateral to the mass which cover a length of approximately 18 mm.  On physical exam, I palpate a 3-4 cm mass at 6 o'clock 5 cm from the right nipple.  Ultrasound is performed, showing  an irregular hypoechoic mass with microcalcifications at 6 o'clock 5 cm from the right nipple. This measures at least 2.0 x 1.9 x 3.0 cm. It has a hyperechoic halo. There is a hypoechoic tubular structure with microcalcifications extending a length of 1.7 cm from the superior margin the mass towards the nipple. Sonography of the right axilla demonstrates a lymph node with a markedly thickened cortex.  The appearance is highly suspicious for invasive mammary carcinoma with associated ductal carcinoma and axillary metastasis. Ultrasound-guided core needle biopsy of the mass is suggested today. If carcinoma is confirmed, MRI could be performed to assess for any additional areas that may need to be biopsied if breast conservation is desired.  IMPRESSION: Highly suspicious mass with microcalcifications an abnormal right axillary lymph node as described above.  RECOMMENDATION: Ultrasound-guided core needle biopsy of the mass is recommended and will be performed and reported separately.  I have discussed the findings and recommendations with the patient. Results were also provided in writing at the conclusion of the visit. If applicable, a reminder letter will be sent to the patient regarding the next appointment.  Report was telephoned to [facility] at Dr. Zelphia Cairo office.  BI-RADS CATEGORY  5: Highly suggestive of malignancy - appropriate action should be taken.   Electronically Signed   By: Ulyess Blossom M.D.   On: 08/16/2013 10:41   Mm Digital Diagnostic Unilat R  08/16/2013   CLINICAL DATA:  Ultrasound-guided core needle biopsy of a mass at 6 o'clock 5 cm from the right nipple with clip placement.  EXAM: DIAGNOSTIC RIGHT MAMMOGRAM POST ULTRASOUND BIOPSY   COMPARISON:  Previous exams  FINDINGS: Mammographic images were obtained following ultrasound guided biopsy of a mass at 6 o'clock 5 cm from the right nipple. The coil clip is appropriately positioned within the mass.  IMPRESSION: Appropriate clip placement following ultrasound-guided core needle biopsy of the mass at 6 o'clock 5 cm from the right nipple.  Final Assessment: Post Procedure Mammograms for Marker Placement   Electronically Signed   By: Ulyess Blossom M.D.   On: 08/16/2013 10:48   Mm Radiologist Eval And Mgmt  08/17/2013   EXAM: ESTABLISHED PATIENT OFFICE VISIT -LEVEL II [phone])  HISTORY OF PRESENT ILLNESS: Spiculated mass in the right breast at 6 o'clock biopsied with ultrasound guidance on 08/15/2013  CHIEF COMPLAINT: The patient returns to obtain pathology results for ultrasound-guided biopsy of right breast mass and abnormal right lymph node performed on 08/15/2013  PHYSICAL EXAMINATION: Both biopsy sites are dry with no evidence for seepage or signs of infection. A small area of bruising is present associated with the biopsy site for the mass and the patient reports some discomfort with this biopsy. No discomfort is present associated with the biopsy site of the lymph node.  ASSESSMENT AND PLAN: The patient has been scheduled for surgical followup with Dr. Marlou Starks on 08/24/2013 and MRI of the breast on 08/25/2013. All of the patient's questions were answered.  CONSULTATION: PATHOLOGY: Biopsy of the mass reveals grade 2 invasive ductal carcinoma and ductal carcinoma in situ with necrosis. Biopsy of the lymph node reveals almost complete involvement with carcinoma.  CONCORDANT:  Yes  RECOMMENDATION: Surgical consultation and MRI of the breast as scheduled above   Electronically Signed   By: Willadean Carol M.D.   On: 08/17/2013 17:19   [place] Rt Breast Bx W Loc Dev 1st Lesion Img Bx Spec US Guide  08/16/2013   CLINICAL DATA:  Spiculated mass with microcalcifications at 6 o'clock 5  cm from the right  nipple.  EXAM: ULTRASOUND GUIDED RIGHT BREAST CORE NEEDLE BIOPSY WITH VACUUM ASSIST  COMPARISON:  Previous exams.  PROCEDURE: I met with the patient and we discussed the procedure of ultrasound-guided biopsy, including benefits and alternatives. We discussed the high likelihood of a successful procedure. We discussed the risks of the procedure including infection, bleeding, tissue injury, clip migration, and inadequate sampling. Informed written consent was given. The usual time-out protocol was performed immediately prior to the procedure.  Using sterile technique, 2% lidocaine for local anesthesia, and ultrasound guidance, a 12 gauge vacuum-assisteddevice was used to perform biopsy of the mass at 6 o'clock in the right breast using a College cranial approach. At the conclusion of the procedure, a coil tissue marker clip was deployed into the biopsy cavity. Follow-up 2-view mammogram was performed and dictated separately.  IMPRESSION: Ultrasound-guided biopsy of a spiculated mass with microcalcifications at 6 o'clock 5 cm from the right nipple. No apparent complications.   Electronically Signed   By: Ulyess Blossom M.D.   On: 08/16/2013 10:46   [place] Rt Breast Bx W Loc Dev Ea Add Lesion Img Bx Spec US Guide  08/16/2013   CLINICAL DATA:  Abnormal right axillary lymph node.  EXAM: ULTRASOUND GUIDED RIGHT AXILLA CORE NEEDLE BIOPSY  COMPARISON:  Previous exams.  FINDINGS: I met with the patient and we discussed the procedure of ultrasound-guided biopsy, including benefits and alternatives. We discussed the high likelihood of a successful procedure. We discussed the risks of the procedure, including infection, bleeding, tissue injury, clip migration, and inadequate sampling. Informed written consent was given. The usual time-out protocol was performed immediately prior to the procedure.  Using sterile technique, 2% Lidocaine as local anesthetic, and ultrasound guidance, a 14 gauge spring-loadeddevice was used to  perform biopsy of the abnormal right axillary lymph node using an oblique lateromedial approach.  IMPRESSION: Ultrasound guided biopsy of an abnormal right axillary lymph node. No apparent complications.   Electronically Signed   By: Ulyess Blossom M.D.   On: 08/16/2013 10:45     LABS:    Chemistry      Component Value Date/Time   NA 142 10/02/2013 1003   NA 138 08/30/2013 0916   K 3.4* 10/02/2013 1003   K 4.0 08/30/2013 0916   CL 105 08/30/2013 0916   CO2 21* 10/02/2013 1003   CO2 21 08/30/2013 0916   BUN 8.7 10/02/2013 1003   BUN 10 08/30/2013 0916   CREATININE 0.8 10/02/2013 1003   CREATININE 0.60 08/30/2013 0916      Component Value Date/Time   CALCIUM 9.2 10/02/2013 1003   CALCIUM 9.3 08/30/2013 0916   ALKPHOS 108 10/02/2013 1003   AST 26 10/02/2013 1003   ALT 82* 10/02/2013 1003   BILITOT 0.39 10/02/2013 1003      Lab Results  Component Value Date   WBC 6.6 10/02/2013   HGB 14.1 10/02/2013   HCT 40.8 10/02/2013   MCV 88.4 10/02/2013   PLT 83* 10/02/2013       PATHOLOGY: ADDITIONAL INFORMATION: 1. PROGNOSTIC INDICATORS - ACIS Results: IMMUNOHISTOCHEMICAL AND MORPHOMETRIC ANALYSIS BY THE AUTOMATED CELLULAR IMAGING SYSTEM (ACIS) Estrogen Receptor: 100%, POSITIVE, STRONG STAINING INTENSITY Progesterone Receptor: 100%, POSITIVE, STRONG STAINING INTENSITY Proliferation Marker Ki67: 72% REFERENCE RANGE ESTROGEN RECEPTOR NEGATIVE <1% POSITIVE =>1% PROGESTERONE RECEPTOR NEGATIVE <1% POSITIVE =>1% All controls stained appropriately Enid Cutter MD Pathologist, Electronic Signature ( Signed 08/23/2013) 1. CHROMOGENIC IN-SITU HYBRIDIZATION Results: HER2/NEU BY CISH - SHOWS AMPLIFICATION BY CISH ANALYSIS.  RESULT RATIO OF HER2: CEP 17 SIGNALS 5.77 AVERAGE HER2 COPY NUMBER PER CELL 7.50 REFERENCE RANGE 1 of 4 FINAL for Duff, Marieelena ([id number]) ADDITIONAL INFORMATION:(continued) NEGATIVE HER2/Chr17 Ratio <2.0 and Average HER2 copy number <4.0 EQUIVOCAL  HER2/Chr17 Ratio <2.0 and Average HER2 copy number 4.0 and <6.0 POSITIVE HER2/Chr17 Ratio >=2.0 and/or Average HER2 copy number >=6.0 Enid Cutter MD Pathologist, Electronic Signature ( Signed 08/22/2013) 2. PROGNOSTIC INDICATORS - ACIS Results: IMMUNOHISTOCHEMICAL AND MORPHOMETRIC ANALYSIS BY THE AUTOMATED CELLULAR IMAGING SYSTEM (ACIS) Estrogen Receptor: 100%, POSITIVE, STRONG STAINING INTENSITY Progesterone Receptor: 99%, POSITIVE, STRONG STAINING INTENSITY Proliferation Marker Ki67: 59% REFERENCE RANGE ESTROGEN RECEPTOR NEGATIVE <1% POSITIVE =>1% PROGESTERONE RECEPTOR NEGATIVE <1% POSITIVE =>1% All controls stained appropriately Enid Cutter MD Pathologist, Electronic Signature ( Signed 08/23/2013) 2. CHROMOGENIC IN-SITU HYBRIDIZATION Results: HER2/NEU BY CISH - SHOWS AMPLIFICATION BY CISH ANALYSIS. RESULT RATIO OF HER2: CEP 17 SIGNALS 5.73 AVERAGE HER2 COPY NUMBER PER CELL 7.45 REFERENCE RANGE NEGATIVE HER2/Chr17 Ratio <2.0 and Average HER2 copy number <4.0 EQUIVOCAL HER2/Chr17 Ratio <2.0 and Average HER2 copy number 4.0 and <6.0 POSITIVE HER2/Chr17 Ratio >=2.0 and/or Average HER2 copy number >=6.0 Enid Cutter MD Pathologist, Electronic Signature ( Signed 08/22/2013) 2 of 4 FINAL for Dietze, Evelyne ([id number]) FINAL DIAGNOSIS Diagnosis 1. Breast, right, needle core biopsy, mass, 6 o'clock, 5 cm fn - INVASIVE DUCTAL CARCINOMA. - DUCTAL CARCINOMA IN SITU WITH NECROSIS. - SEE COMMENT. 2. Lymph node, needle/core biopsy, right axilla - POSITIVE FOR DUCTAL CARCINOMA. - SEE COMMENT. Microscopic Comment 1. Although definitive grading of breast carcinoma is best done on excision, the features of the invasive tumor from the right 6 o'clock mass, 5 cm from nipple are compatible with a grade II breast carcinoma. Breast prognostic markers will be performed and reported in an addendum. Findings are called to the [place] on 08/17/13. Dr. Lyndon Code has seen  this case in consultation with agreement. 2. Of note, almost the entire needle core biopsy of the specimen submitted a right axillary lymph node demonstrates nests of invasive ductal carcinoma. The findings may represent a replaced lymph node with tumor. Please correlate with clinical and radiologic impression. A breast prognostic profile will be performed on this second specimen and reported in an addendum. The findings are called to the [facility] on 08/17/13. Dr. Lyndon Code has seen the second specimen in consultation with agreement. (RAH:caf 08/17/13) Willeen Niece MD Pathologist, Electronic Signature (Case signed 08/17/2013) Specimen Gross and Clinical Information Specimen Comment 1. 3 cm palpable spiculated mass with microcalcifications In formalin 9:50, extracted <1 min 2. Abnormal right axillary node In formalin 9:55, extracted <1 min Specimen(s) Obtained: 1. 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  status post completion of cycle 1 neoadjuvant chemotherapy consisting of TCH/perjeta on 09/19/2013 with Neulasta support 09/20/2013. Next chemotherapy scheduled on 10/10/2013 #2 Oral mucositis: Continue Magic mouth wash consists of oral Xylocaine 2%, maalox, Benadryl and Carafate to swish and swallow every 3-4 hours as needed. I have also added the nystatin mouthwash every 4 hours as needed #3 Dehydration secondary to poor by mouth intake: We'll give IV fluids 2 L of half normal saline today. 10 KCl to be added to the first liter of fluids #4 Tachycardia secondary to dehydration-improving when compared to the previous visit: we'll arrange for IV fluids. We'll add magnesium level  to the CMP which has been ordered today  #5 Heavy menstrual periods  and cramping: S/p  Zoladex 3.6 mg injection on 09/25/2012 .  #6. Poor po intake: I have encouraged the patient to have boost one can  with high proteins of her choice with either vanilla or chocolate flavor to be taken at least twice daily.  #7 skin rash secondary to questionable Zoladex injection /Taxotere-?Hand -mouth syndrome: I have instructed the patient to apply Aquaphor cream twice daily and  to take dexamethasone 4 mg twice daily for 2 days followed by 4 mg daily for 2 days #8 Thrombocytopenia secondary to chemotherapy-induced: Monitor CBC   Follow up in 1 week with CBC and differential and CMP      All questions were answered. The patient knows to call the clinic with any problems, questions or concerns. We can certainly see the patient much sooner if necessary.  I spent 20 minutes counseling the patient face to face. The total time spent in the appointment was 30 minutes.   Wilmon Arms , MD Medical/Oncology [facility] [phone] (beeper) [phone] (Office)  10/02/2013, 11:21 AM

## 2013-10-02 NOTE — Patient Instructions (Signed)
[place]  Today you received IV fluids with potassium.    Feel free to call the clinic you have any questions or concerns. The clinic phone number is [phone].     Dehydration, Adult Dehydration is when you lose more fluids from the body than you take in. Vital organs like the kidneys, brain, and heart cannot function without a proper amount of fluids and salt. Any loss of fluids from the body can cause dehydration.  CAUSES   Vomiting.  Diarrhea.  Excessive sweating.  Excessive urine output.  Fever. SYMPTOMS  Mild dehydration  Thirst.  Dry lips.  Slightly dry mouth. Moderate dehydration  Very dry mouth.  Sunken eyes.  Skin does not bounce back quickly when lightly pinched and released.  Dark urine and decreased urine production.  Decreased tear production.  Headache. Severe dehydration  Very dry mouth.  Extreme thirst.  Rapid, weak pulse (more than 100 beats per minute at rest).  Cold hands and feet.  Not able to sweat in spite of heat and temperature.  Rapid breathing.  Blue lips.  Confusion and lethargy.  Difficulty being awakened.  Minimal urine production.  No tears. DIAGNOSIS  Your caregiver will diagnose dehydration based on your symptoms and your exam. Blood and urine tests will help confirm the diagnosis. The diagnostic evaluation should also identify the cause of dehydration. TREATMENT  Treatment of mild or moderate dehydration can often be done at home by increasing the amount of fluids that you drink. It is best to drink small amounts of fluid more often. Drinking too much at one time can make vomiting worse. Refer to the home care instructions below. Severe dehydration needs to be treated at the hospital where you will probably be given intravenous (IV) fluids that contain water and electrolytes. HOME CARE INSTRUCTIONS   Ask your caregiver about specific rehydration instructions.  Drink enough fluids to keep  your urine clear or pale yellow.  Drink small amounts frequently if you have nausea and vomiting.  Eat as you normally do.  Avoid:  Foods or drinks high in sugar.  Carbonated drinks.  Juice.  Extremely hot or cold fluids.  Drinks with caffeine.  Fatty, greasy foods.  Alcohol.  Tobacco.  Overeating.  Gelatin desserts.  Wash your hands well to avoid spreading bacteria and viruses.  Only take over-the-counter or prescription medicines for pain, discomfort, or fever as directed by your caregiver.  Ask your caregiver if you should continue all prescribed and over-the-counter medicines.  Keep all follow-up appointments with your caregiver. SEEK MEDICAL CARE IF:  You have abdominal pain and it increases or stays in one area (localizes).  You have a rash, stiff neck, or severe headache.  You are irritable, sleepy, or difficult to awaken.  You are weak, dizzy, or extremely thirsty. SEEK IMMEDIATE MEDICAL CARE IF:   You are unable to keep fluids down or you get worse despite treatment.  You have frequent episodes of vomiting or diarrhea.  You have blood or green matter (bile) in your vomit.  You have blood in your stool or your stool looks black and tarry.  You have not urinated in 6 to 8 hours, or you have only urinated a small amount of very dark urine.  You have a fever.  You faint. MAKE SURE YOU:   Understand these instructions.  Will watch your condition.  Will get help right away if you are not doing well or get worse. Document Released: 08/31/2005 Document Revised:  11/23/2011 Document Reviewed: 04/20/2011 Exit[employer] Patient Information 2014 [place], [place].

## 2013-10-02 NOTE — Addendum Note (Signed)
Addended by: Wilmon Arms on: 10/02/2013 12:51 PM   Modules accepted: Orders

## 2013-10-03 ENCOUNTER — Encounter (INDEPENDENT_AMBULATORY_CARE_PROVIDER_SITE_OTHER): Payer: Self-pay | Admitting: General Surgery

## 2013-10-03 ENCOUNTER — Ambulatory Visit (INDEPENDENT_AMBULATORY_CARE_PROVIDER_SITE_OTHER): Payer: BC Managed Care – PPO | Admitting: General Surgery

## 2013-10-03 VITALS — BP 130/84 | HR 74 | Resp 14 | Ht 62.0 in | Wt 207.6 lb

## 2013-10-03 DIAGNOSIS — C50519 Malignant neoplasm of lower-outer quadrant of unspecified female breast: Secondary | ICD-10-CM

## 2013-10-03 DIAGNOSIS — C50511 Malignant neoplasm of lower-outer quadrant of right female breast: Secondary | ICD-10-CM

## 2013-10-03 NOTE — Progress Notes (Signed)
Subjective:     Patient ID: Joan Valdez, female   DOB: 1971/02/22, 43 y.o.   MRN: [id number]  HPI The patient is a 43 year old white female who has a locally advanced right breast cancer in the 6:00 position. Initially it measured about 4 cm as a palpable mass. She has just started chemotherapy and is only had one cycle so far. She feels as though she has had every complication possible from the one dose of chemotherapy. She feels terrible today. She is still in good spirits the  Review of Systems  Constitutional: Negative.   HENT: Positive for trouble swallowing.   Eyes: Negative.   Respiratory: Negative.   Cardiovascular: Negative.   Gastrointestinal: Negative.   Endocrine: Negative.   Genitourinary: Negative.   Musculoskeletal: Negative.   Skin: Positive for rash.  Allergic/Immunologic: Negative.   Neurological: Negative.   Hematological: Negative.   Psychiatric/Behavioral: Negative.        Objective:   Physical Exam  Constitutional: She is oriented to person, place, and time. She appears well-developed and well-nourished.  HENT:  Head: Normocephalic and atraumatic.  Eyes: Conjunctivae and EOM are normal. Pupils are equal, round, and reactive to light.  Neck: Normal range of motion. Neck supple.  Cardiovascular: Normal rate, regular rhythm and normal heart sounds.   Pulmonary/Chest: Effort normal and breath sounds normal.  The palpable mass in the 6:00 position of the right breast only feels about 2 cm at this point. There is no palpable axillary, supraclavicular, or cervical lymphadenopathy  Abdominal: Soft. Bowel sounds are normal.  Musculoskeletal: Normal range of motion.  Lymphadenopathy:    She has no cervical adenopathy.  Neurological: She is alert and oriented to person, place, and time.  Skin: Skin is warm and dry.  Psychiatric: She has a normal mood and affect. Her behavior is normal.       Assessment:     The patient has a locally advanced right breast  cancer that seems to be responding nicely to neoadjuvant chemotherapy     Plan:     At this point she will continue with her chemotherapy regimen. We'll plan to see her back in one month to check her progress

## 2013-10-04 ENCOUNTER — Other Ambulatory Visit: Payer: Self-pay | Admitting: Oncology

## 2013-10-07 ENCOUNTER — Other Ambulatory Visit: Payer: Self-pay | Admitting: Oncology

## 2013-10-09 ENCOUNTER — Other Ambulatory Visit: Payer: Self-pay | Admitting: *Deleted

## 2013-10-09 DIAGNOSIS — C50519 Malignant neoplasm of lower-outer quadrant of unspecified female breast: Secondary | ICD-10-CM

## 2013-10-09 MED ORDER — LORAZEPAM 0.5 MG PO TABS: ORAL_TABLET | ORAL | Status: DC

## 2013-10-10 ENCOUNTER — Encounter: Payer: Self-pay | Admitting: Oncology

## 2013-10-10 ENCOUNTER — Other Ambulatory Visit (HOSPITAL_BASED_OUTPATIENT_CLINIC_OR_DEPARTMENT_OTHER): Payer: BC Managed Care – PPO

## 2013-10-10 ENCOUNTER — Telehealth: Payer: Self-pay | Admitting: *Deleted

## 2013-10-10 ENCOUNTER — Ambulatory Visit (HOSPITAL_BASED_OUTPATIENT_CLINIC_OR_DEPARTMENT_OTHER): Payer: BC Managed Care – PPO

## 2013-10-10 ENCOUNTER — Ambulatory Visit (HOSPITAL_BASED_OUTPATIENT_CLINIC_OR_DEPARTMENT_OTHER): Payer: BC Managed Care – PPO | Admitting: Oncology

## 2013-10-10 ENCOUNTER — Other Ambulatory Visit: Payer: BC Managed Care – PPO

## 2013-10-10 ENCOUNTER — Encounter: Payer: Self-pay | Admitting: *Deleted

## 2013-10-10 VITALS — BP 148/83 | HR 96 | Temp 98.2°F | Resp 18 | Ht 62.0 in | Wt 208.1 lb

## 2013-10-10 DIAGNOSIS — C50919 Malignant neoplasm of unspecified site of unspecified female breast: Secondary | ICD-10-CM

## 2013-10-10 DIAGNOSIS — C50511 Malignant neoplasm of lower-outer quadrant of right female breast: Secondary | ICD-10-CM

## 2013-10-10 DIAGNOSIS — R197 Diarrhea, unspecified: Secondary | ICD-10-CM

## 2013-10-10 DIAGNOSIS — C50519 Malignant neoplasm of lower-outer quadrant of unspecified female breast: Secondary | ICD-10-CM

## 2013-10-10 DIAGNOSIS — Z5112 Encounter for antineoplastic immunotherapy: Secondary | ICD-10-CM

## 2013-10-10 DIAGNOSIS — Z170 Estrogen receptor positive status [ER+]: Secondary | ICD-10-CM

## 2013-10-10 DIAGNOSIS — C773 Secondary and unspecified malignant neoplasm of axilla and upper limb lymph nodes: Secondary | ICD-10-CM

## 2013-10-10 DIAGNOSIS — Z5111 Encounter for antineoplastic chemotherapy: Secondary | ICD-10-CM

## 2013-10-10 DIAGNOSIS — R21 Rash and other nonspecific skin eruption: Secondary | ICD-10-CM

## 2013-10-10 LAB — CBC WITH DIFFERENTIAL/PLATELET
BASO%: 0.2 % (ref 0.0–2.0)
Basophils Absolute: 0 10*3/uL (ref 0.0–0.1)
EOS ABS: 0 10*3/uL (ref 0.0–0.5)
EOS%: 0 % (ref 0.0–7.0)
HEMATOCRIT: 39.3 % (ref 34.8–46.6)
HGB: 13.6 g/dL (ref 11.6–15.9)
LYMPH%: 10.7 % — AB (ref 14.0–49.7)
MCH: 30.6 pg (ref 25.1–34.0)
MCHC: 34.5 g/dL (ref 31.5–36.0)
MCV: 88.8 fL (ref 79.5–101.0)
MONO#: 0.2 10*3/uL (ref 0.1–0.9)
MONO%: 1.7 % (ref 0.0–14.0)
NEUT#: 9.9 10*3/uL — ABNORMAL HIGH (ref 1.5–6.5)
NEUT%: 87.4 % — AB (ref 38.4–76.8)
PLATELETS: 130 10*3/uL — AB (ref 145–400)
RBC: 4.43 10*6/uL (ref 3.70–5.45)
RDW: 13.4 % (ref 11.2–14.5)
WBC: 11.4 10*3/uL — AB (ref 3.9–10.3)
lymph#: 1.2 10*3/uL (ref 0.9–3.3)

## 2013-10-10 LAB — COMPREHENSIVE METABOLIC PANEL (CC13)
ALBUMIN: 3.6 g/dL (ref 3.5–5.0)
ALT: 61 U/L — AB (ref 0–55)
ANION GAP: 11 meq/L (ref 3–11)
AST: 24 U/L (ref 5–34)
Alkaline Phosphatase: 105 U/L (ref 40–150)
BUN: 13 mg/dL (ref 7.0–26.0)
CALCIUM: 9.4 mg/dL (ref 8.4–10.4)
CHLORIDE: 113 meq/L — AB (ref 98–109)
CO2: 16 mEq/L — ABNORMAL LOW (ref 22–29)
Creatinine: 0.7 mg/dL (ref 0.6–1.1)
GLUCOSE: 213 mg/dL — AB (ref 70–140)
POTASSIUM: 3.7 meq/L (ref 3.5–5.1)
SODIUM: 140 meq/L (ref 136–145)
Total Bilirubin: 0.3 mg/dL (ref 0.20–1.20)
Total Protein: 7 g/dL (ref 6.4–8.3)

## 2013-10-10 LAB — MAGNESIUM (CC13): MAGNESIUM: 1.7 mg/dL (ref 1.5–2.5)

## 2013-10-10 MED ORDER — SODIUM CHLORIDE 0.9 % IJ SOLN
10.0000 mL | INTRAMUSCULAR | Status: DC | PRN
  Administered 2013-10-10: 10 mL
  Filled 2013-10-10: qty 10

## 2013-10-10 MED ORDER — SODIUM CHLORIDE 0.9 % IV SOLN
75.0000 mg/m2 | Freq: Once | INTRAVENOUS | Status: AC
  Administered 2013-10-10: 150 mg via INTRAVENOUS
  Filled 2013-10-10: qty 15

## 2013-10-10 MED ORDER — SODIUM CHLORIDE 0.9 % IV SOLN
900.0000 mg | Freq: Once | INTRAVENOUS | Status: AC
  Administered 2013-10-10: 900 mg via INTRAVENOUS
  Filled 2013-10-10: qty 90

## 2013-10-10 MED ORDER — TRASTUZUMAB CHEMO INJECTION 440 MG
6.0000 mg/kg | Freq: Once | INTRAVENOUS | Status: AC
  Administered 2013-10-10: 588 mg via INTRAVENOUS
  Filled 2013-10-10: qty 28

## 2013-10-10 MED ORDER — ACETAMINOPHEN 325 MG PO TABS
ORAL_TABLET | ORAL | Status: AC
  Filled 2013-10-10: qty 2

## 2013-10-10 MED ORDER — DIPHENHYDRAMINE HCL 25 MG PO CAPS
50.0000 mg | ORAL_CAPSULE | Freq: Once | ORAL | Status: AC
  Administered 2013-10-10: 50 mg via ORAL

## 2013-10-10 MED ORDER — ACETAMINOPHEN 325 MG PO TABS
650.0000 mg | ORAL_TABLET | Freq: Once | ORAL | Status: AC
  Administered 2013-10-10: 650 mg via ORAL

## 2013-10-10 MED ORDER — ONDANSETRON 16 MG/50ML IVPB (CHCC)
16.0000 mg | Freq: Once | INTRAVENOUS | Status: AC
Start: 2013-10-10 — End: 2013-10-10
  Administered 2013-10-10: 16 mg via INTRAVENOUS

## 2013-10-10 MED ORDER — ONDANSETRON 16 MG/50ML IVPB (CHCC)
INTRAVENOUS | Status: AC
  Filled 2013-10-10: qty 16

## 2013-10-10 MED ORDER — DEXAMETHASONE SODIUM PHOSPHATE 20 MG/5ML IJ SOLN
20.0000 mg | Freq: Once | INTRAMUSCULAR | Status: AC
  Administered 2013-10-10: 20 mg via INTRAVENOUS

## 2013-10-10 MED ORDER — DEXAMETHASONE SODIUM PHOSPHATE 20 MG/5ML IJ SOLN
INTRAMUSCULAR | Status: AC
  Filled 2013-10-10: qty 5

## 2013-10-10 MED ORDER — PERTUZUMAB CHEMO INJECTION 420 MG/14ML
420.0000 mg | Freq: Once | INTRAVENOUS | Status: AC
  Administered 2013-10-10: 420 mg via INTRAVENOUS
  Filled 2013-10-10: qty 14

## 2013-10-10 MED ORDER — SODIUM CHLORIDE 0.9 % IV SOLN
Freq: Once | INTRAVENOUS | Status: AC
  Administered 2013-10-10: 10:00:00 via INTRAVENOUS

## 2013-10-10 MED ORDER — DIPHENHYDRAMINE HCL 25 MG PO CAPS
ORAL_CAPSULE | ORAL | Status: AC
  Filled 2013-10-10: qty 2

## 2013-10-10 MED ORDER — HEPARIN SOD (PORK) LOCK FLUSH 100 UNIT/ML IV SOLN
500.0000 [IU] | Freq: Once | INTRAVENOUS | Status: AC | PRN
  Administered 2013-10-10: 500 [IU]
  Filled 2013-10-10: qty 5

## 2013-10-10 MED ORDER — OXYCODONE-ACETAMINOPHEN 5-325 MG PO TABS: 1.0000 | ORAL_TABLET | ORAL | Status: DC | PRN

## 2013-10-10 NOTE — Patient Instructions (Signed)
[facility] Discharge Instructions for Patients Receiving Chemotherapy  Today you received the following chemotherapy agents Herceptin, Perjeta, Taxotere, Carboplatin.   To help prevent nausea and vomiting after your treatment, we encourage you to take your nausea medication as prescribed.    If you develop nausea and vomiting that is not controlled by your nausea medication, call the clinic.   BELOW ARE SYMPTOMS THAT SHOULD BE REPORTED IMMEDIATELY:  *FEVER GREATER THAN 100.5 F  *CHILLS WITH OR WITHOUT FEVER  NAUSEA AND VOMITING THAT IS NOT CONTROLLED WITH YOUR NAUSEA MEDICATION  *UNUSUAL SHORTNESS OF BREATH  *UNUSUAL BRUISING OR BLEEDING  TENDERNESS IN MOUTH AND THROAT WITH OR WITHOUT PRESENCE OF ULCERS  *URINARY PROBLEMS  *BOWEL PROBLEMS  UNUSUAL RASH Items with * indicate a potential emergency and should be followed up as soon as possible.  Feel free to call the clinic should you have any questions or concerns. The clinic phone number is [phone].  It was my pleasure to take care of you today!  Leeanne Rio, RN

## 2013-10-10 NOTE — Progress Notes (Deleted)
Joan Valdez [id number] Jan 19, 1971 42 y.o. 10/10/2013 9:00 AM  CC  Joan Grosser, MD [address] [place] [address] Dr. Renae Fickle toth  Diagnosis:  43 year old female with new diagnosis of triple positive invasive ductal carcinoma of the right breast diagnosed 08/16/2013  STAGE:  Breast cancer of lower-outer quadrant of right female breast   Primary site: Breast (Right)   Staging method: AJCC 7th Edition   Clinical: Stage IIIA (T3, N1, cM0) signed by Victorino December, MD on 09/21/2013  6:07 PM   Summary: Stage IIIA (T3, N1, cM0)  Current therapy: s/p TCH/perjeta cycle 1 day 1 chemotherapy completed on 09/19/2012  Chief complaint: Joan Valdez is here today with the complaints of skin rash and not feeling well  Past oncologic hisotry:  Joan Valdez is a 43 y.o. female.  Would medical history significant for hypothyroidism gastroesophageal reflux disease and migraine headaches.patient had a routine screening mammogram performed she was noted to have a large mass in the 6:00 position of the right breast measuring 3 cm for the primary mass with another 2 cm of calcifications anteriorly. She was also noted to have a enlarged lymph node in the right axilla. Patient underwent a needle core biopsy of the primary mass as well as axilla. The tumor was ER positive PR positive HER-2 positive with a proliferation marker Ki-67 59%. Patient had MRI of the breasts performed MRI showed right mass to measure 8.2 x 5.6 x 3.1 cm lymph node measured 2.5 cm on the left breast there was a 9 millimeter area of concern.   Interval history:   [name] received on Zoladex injection during the last visit on  09/25/2012. She developed skin rash on both hands and also on the face since Saturday. She says that it is burning and itching. She says that she is able to take soup and not able to eat well because of  mouth pain.  denies any fever, shortness of breath, chest pain, palpitations, constipation, blood  in the stool or  blood in the urine. denies any diarrhea.   Past Medical History: Past Medical History  Diagnosis Date  . PCO (polycystic ovaries)   . Hypothyroid   . Allergic rhinitis   . Chronic headache   . Acid reflux   . Cervical dysplasia   . Stone, kidney     Hx: of  . Cancer     ;Breast cancer; pre-cancerous cervical cells  . Breast cancer 08/16/13    invasive ductal ca,DCIS,     Past Surgical History: Past Surgical History  Procedure Laterality Date  . Cesarean section  2001  . Colposcopy    . Cervical biopsy  w/ loop electrode excision  2002  . Eye surgery      Hx: of metal fragment removed  . Portacath placement N/A 09/04/2013    Procedure: INSERTION PORT-A-CATH;  Surgeon: Robyne Askew, MD;  Location: [facility] OR;  Service: General;  Laterality: N/A;    Family History: Family History  Problem Relation Age of Onset  . Hypertension Mother   . Migraines Mother   . Diabetes Paternal Grandmother   . Hypertension Paternal Grandmother   . Rheum arthritis Paternal Grandmother   . Heart disease Maternal Grandmother     Social History History  Substance Use Topics  . Smoking status: Never Smoker   . Smokeless tobacco: Never Used  . Alcohol Use: 1.0 oz/week    2 drink(s) per week     Comment: socially.  Allergies: Allergies  Allergen Reactions  . Imitrex [Sumatriptan Base] Anaphylaxis  . Amoxicillin Hives  . Bactrim Hives  . Cephalexin Hives  . Erythromycin Hives  . Penicillins Hives  . Strawberry Hives  . Sulfa Antibiotics Hives  . Zithromax [Azithromycin Dihydrate] Hives    Current Medications: Current Outpatient Prescriptions  Medication Sig Dispense Refill  . cetirizine (ZYRTEC) 10 MG tablet Take 10 mg by mouth at bedtime as needed.       Marland Kitchen dexamethasone (DECADRON) 4 MG tablet Take 2 tablets (8 mg total) by mouth 2 (two) times daily with a meal. Take two times a day the day before Taxotere. Then take two times a day starting the day after chemo  for 3 days.  30 tablet  1  . dexlansoprazole (DEXILANT) 60 MG capsule Take 60 mg by mouth daily.      . Eflornithine HCl (VANIQA) 13.9 % cream Apply pea-sized amount as directed  30 g  5  . levothyroxine (SYNTHROID, LEVOTHROID) 100 MCG tablet Take 100 mcg by mouth daily.       Marland Kitchen lidocaine-prilocaine (EMLA) cream Apply topically as needed.  30 g  6  . liothyronine (CYTOMEL) 25 MCG tablet Take 12.5 mcg by mouth 2 (two) times daily.       . Nutritional Supplements (JUICE PLUS FIBRE PO) Take 1 tablet by mouth daily.      . ranitidine (ZANTAC) 300 MG tablet Take 300 mg by mouth at bedtime.      . topiramate (TOPAMAX) 100 MG tablet Take 200 mg by mouth at bedtime.      . Alum & Mag Hydroxide-Simeth (MAGIC MOUTHWASH W/LIDOCAINE) SOLN 5-31ml q4hrs prn. Swish and spit, may swallow for sore throat.  480 mL  3  . dihydroergotamine (MIGRANAL) 4 MG/ML nasal spray Place 1 spray into the nose as needed for migraine. Use in one nostril as directed.  No more than 4 sprays in one hour      . ibuprofen (ADVIL,MOTRIN) 200 MG tablet Take 400 mg by mouth every 8 (eight) hours as needed. For pain      . LORazepam (ATIVAN) 0.5 MG tablet TAKE 1 TABLET BY MOUTH EVERY 6 HOURS AS NEEDED  30 tablet  0  . mometasone (NASONEX) 50 MCG/ACT nasal spray Place 2 sprays into both nostrils daily.       Marland Kitchen nystatin (MYCOSTATIN) 100000 UNIT/ML suspension Take 5 mLs (500,000 Units total) by mouth 4 (four) times daily.  60 mL  0  . ondansetron (ZOFRAN) 8 MG tablet Take 1 tablet (8 mg total) by mouth 2 (two) times daily. Take two times a day starting the day after chemo for 3 days. Then take two times a day as needed for nausea or vomiting.  30 tablet  1  . prochlorperazine (COMPAZINE) 10 MG tablet Take 1 tablet (10 mg total) by mouth every 6 (six) hours as needed (Nausea or vomiting).  30 tablet  1   No current facility-administered medications for this visit.    OB/GYN History:menarche at age 41 she is premenopausal last menstrual  period was on 08/11/2013. First live birth at 46.  Fertility Discussion:patient has completed her family Prior History of Cancer: no  Health Maintenance:  Colonoscopy no Bone Densityno Last PAP smearup-to-date  ECOG PERFORMANCE STATUS: 1 - Symptomatic but completely ambulatory  Genetic Counseling/testing: yes  REVIEW OF SYSTEMS:  All 14 review of systems has been assessed and positive findings are mentioned in Interval history  EXAMINATION: Blood pressure 148/83,  pulse 96, temperature 98.2 F (36.8 C), temperature source Oral, resp. rate 18, height $RemoveBe'5\' 2"'JItOxdiZG$  (1.575 m), weight 208 lb 1.6 oz (94.394 kg). repeat pulse rate 110 per minute   GENERAL: healthy, no distress, well nourished and well developed SKIN: skin color, texture, turgor are normal HEAD: Normocephalic EYES: PERRLA, EOMI, Conjunctiva are pink and non-injected EARS: External ears normal OROPHARYNX: Oral mucositis noted. No ulcers noticed.  NECK: no adenopathy LYMPH:  no hepatosplenomegaly BREAST:left breast normal without mass, skin or nipple changes or axillary nodes, right breast and right axilla palpable masses there is erythema ecchymosis secondary to the biopsy LUNGS: clear to auscultation and percussion HEART: regular rate & rhythm ABDOMEN:abdomen soft, non-tender, normal bowel sounds and no masses or organomegaly BACK: No CVA tenderness EXTREMITIES:less then 2 second capillary refill, no edema, no clubbing, no cyanosis  NEURO: alert & oriented x 3 with fluent speech, no focal motor/sensory deficits, gait normal Skin examination: Dry macular skin and skin rash noticed on both hands, face. No rash noted on the feet.     STUDIES/RESULTS: US Breast Right  08/16/2013   CLINICAL DATA:  Patient returns for evaluation of a mass in the right breast noted on recent screening study dated 07/19/2013.  EXAM: DIGITAL DIAGNOSTIC  RIGHT LIMITED MAMMOGRAM  ULTRASOUND RIGHT BREAST  COMPARISON:  10/21/2007  ACR Breast Density  Category b: There are scattered areas of fibroglandular density.  FINDINGS: Additional views confirm the presence of a spiculated mass with microcalcifications in the 6 o'clock position of the right breast posteriorly. This measures at least 4.2 x 3.9 x 2.8 cm mammographically. There are highly suspicious pleomorphic microcalcifications both within the mass and extending anteriorly from the mass towards the nipple. In addition, there are grouped calcifications superior and lateral to the mass which cover a length of approximately 18 mm.  On physical exam, I palpate a 3-4 cm mass at 6 o'clock 5 cm from the right nipple.  Ultrasound is performed, showing an irregular hypoechoic mass with microcalcifications at 6 o'clock 5 cm from the right nipple. This measures at least 2.0 x 1.9 x 3.0 cm. It has a hyperechoic halo. There is a hypoechoic tubular structure with microcalcifications extending a length of 1.7 cm from the superior margin the mass towards the nipple. Sonography of the right axilla demonstrates a lymph node with a markedly thickened cortex.  The appearance is highly suspicious for invasive mammary carcinoma with associated ductal carcinoma and axillary metastasis. Ultrasound-guided core needle biopsy of the mass is suggested today. If carcinoma is confirmed, MRI could be performed to assess for any additional areas that may need to be biopsied if breast conservation is desired.  IMPRESSION: Highly suspicious mass with microcalcifications an abnormal right axillary lymph node as described above.  RECOMMENDATION: Ultrasound-guided core needle biopsy of the mass is recommended and will be performed and reported separately.  I have discussed the findings and recommendations with the patient. Results were also provided in writing at the conclusion of the visit. If applicable, a reminder letter will be sent to the patient regarding the next appointment.  Report was telephoned to [facility] at Dr. Zelphia Cairo office.   BI-RADS CATEGORY  5: Highly suggestive of malignancy - appropriate action should be taken.   Electronically Signed   By: Ulyess Blossom M.D.   On: 08/16/2013 10:41   Mr Breast Bilateral W Wo Contrast  08/25/2013   CLINICAL DATA:  43 year old female with recently diagnosed invasive ductal carcinoma of the right breast with right  axillary lymph node metastases.  EXAM: BILATERAL BREAST MRI WITH AND WITHOUT CONTRAST  LABS:  No recent labs.  TECHNIQUE: Multiplanar, multisequence MR images of both breasts were obtained prior to and following the intravenous administration of 53ml of MultiHance.  THREE-DIMENSIONAL MR IMAGE RENDERING ON INDEPENDENT WORKSTATION:  Three-dimensional MR images were rendered by post-processing of the original MR data on an independent workstation. The three-dimensional MR images were interpreted, and findings are reported in the following complete MRI report for this study. Three dimensional images were evaluated at the independent DynaCad workstation  COMPARISON:  Previous exams  FINDINGS: Breast composition: c:  Heterogeneous fibroglandular tissue  Background parenchymal enhancement: Mild.  Right breast: Large irregular spiculated mass in the lower outer quadrant of the right breast with contiguous linear clumped non mass enhancement extending posteriorly consistent with known biopsy proven malignancy measures approximately 8.2 cm AP, 5.6 cm transverse, and 3.1 cm craniocaudal. Biopsy clip artifact is present along the inferior margin of the dominant mass. A probable satellite nodule just superior to the dominant mass measures approximately 6 mm. No findings to suggest skin, nipple, or pectoralis muscle involvement.  Left breast: There is linear non mass enhancement in the inferior left breast at the approximate 6 o'clock position mid to posterior depth measuring approximately 9 mm AP. No abnormal skin, nipple, or pectoralis enhancement.  Lymph nodes: Enlarged and morphologically  abnormal lymph node in the right axilla measures 2.5 x 1.2 cm, compatible with known right axillary nodal metastases. No internal mammary lymphadenopathy seen.  Ancillary findings:  None.  IMPRESSION: 1. Biopsy proven malignancy in the lower outer right breast with contiguous linear clumped enhancement extending posteriorly and a probable small satellite nodule present superiorly, with total extent of disease measuring up to 8.2 cm AP. Morphologically abnormal lymph node in the right axilla, compatible with biopsy proven nodal metastases.  2. Indeterminate linear non mass enhancement in the inferior left breast at the approximate 6 o'clock position mid to posterior depth measuring approximately 9 mm.  RECOMMENDATION: 1. MRI guided biopsy of the indeterminate linear non mass enhancement in the inferior left breast is recommended.  2.  Treatment plan for known right breast malignancy.  BI-RADS CATEGORY  4: Suspicious abnormality - biopsy should be considered.   Electronically Signed   By: Everlean Alstrom M.D.   On: 08/25/2013 14:02   Mm Louisa R  08/16/2013   CLINICAL DATA:  Patient returns for evaluation of a mass in the right breast noted on recent screening study dated 07/19/2013.  EXAM: DIGITAL DIAGNOSTIC  RIGHT LIMITED MAMMOGRAM  ULTRASOUND RIGHT BREAST  COMPARISON:  10/21/2007  ACR Breast Density Category b: There are scattered areas of fibroglandular density.  FINDINGS: Additional views confirm the presence of a spiculated mass with microcalcifications in the 6 o'clock position of the right breast posteriorly. This measures at least 4.2 x 3.9 x 2.8 cm mammographically. There are highly suspicious pleomorphic microcalcifications both within the mass and extending anteriorly from the mass towards the nipple. In addition, there are grouped calcifications superior and lateral to the mass which cover a length of approximately 18 mm.  On physical exam, I palpate a 3-4 cm mass at 6 o'clock 5 cm from the  right nipple.  Ultrasound is performed, showing an irregular hypoechoic mass with microcalcifications at 6 o'clock 5 cm from the right nipple. This measures at least 2.0 x 1.9 x 3.0 cm. It has a hyperechoic halo. There is a hypoechoic tubular structure with microcalcifications extending  a length of 1.7 cm from the superior margin the mass towards the nipple. Sonography of the right axilla demonstrates a lymph node with a markedly thickened cortex.  The appearance is highly suspicious for invasive mammary carcinoma with associated ductal carcinoma and axillary metastasis. Ultrasound-guided core needle biopsy of the mass is suggested today. If carcinoma is confirmed, MRI could be performed to assess for any additional areas that may need to be biopsied if breast conservation is desired.  IMPRESSION: Highly suspicious mass with microcalcifications an abnormal right axillary lymph node as described above.  RECOMMENDATION: Ultrasound-guided core needle biopsy of the mass is recommended and will be performed and reported separately.  I have discussed the findings and recommendations with the patient. Results were also provided in writing at the conclusion of the visit. If applicable, a reminder letter will be sent to the patient regarding the next appointment.  Report was telephoned to [facility] at Dr. Zelphia Cairo office.  BI-RADS CATEGORY  5: Highly suggestive of malignancy - appropriate action should be taken.   Electronically Signed   By: Ulyess Blossom M.D.   On: 08/16/2013 10:41   Mm Digital Diagnostic Unilat R  08/16/2013   CLINICAL DATA:  Ultrasound-guided core needle biopsy of a mass at 6 o'clock 5 cm from the right nipple with clip placement.  EXAM: DIAGNOSTIC RIGHT MAMMOGRAM POST ULTRASOUND BIOPSY  COMPARISON:  Previous exams  FINDINGS: Mammographic images were obtained following ultrasound guided biopsy of a mass at 6 o'clock 5 cm from the right nipple. The coil clip is appropriately positioned within the  mass.  IMPRESSION: Appropriate clip placement following ultrasound-guided core needle biopsy of the mass at 6 o'clock 5 cm from the right nipple.  Final Assessment: Post Procedure Mammograms for Marker Placement   Electronically Signed   By: Ulyess Blossom M.D.   On: 08/16/2013 10:48   Mm Radiologist Eval And Mgmt  08/17/2013   EXAM: ESTABLISHED PATIENT OFFICE VISIT -LEVEL II [id number])  HISTORY OF PRESENT ILLNESS: Spiculated mass in the right breast at 6 o'clock biopsied with ultrasound guidance on 08/15/2013  CHIEF COMPLAINT: The patient returns to obtain pathology results for ultrasound-guided biopsy of right breast mass and abnormal right lymph node performed on 08/15/2013  PHYSICAL EXAMINATION: Both biopsy sites are dry with no evidence for seepage or signs of infection. A small area of bruising is present associated with the biopsy site for the mass and the patient reports some discomfort with this biopsy. No discomfort is present associated with the biopsy site of the lymph node.  ASSESSMENT AND PLAN: The patient has been scheduled for surgical followup with Dr. Marlou Starks on 08/24/2013 and MRI of the breast on 08/25/2013. All of the patient's questions were answered.  CONSULTATION: PATHOLOGY: Biopsy of the mass reveals grade 2 invasive ductal carcinoma and ductal carcinoma in situ with necrosis. Biopsy of the lymph node reveals almost complete involvement with carcinoma.  CONCORDANT:  Yes  RECOMMENDATION: Surgical consultation and MRI of the breast as scheduled above   Electronically Signed   By: Willadean Carol M.D.   On: 08/17/2013 17:19   [place] Rt Breast Bx W Loc Dev 1st Lesion Img Bx Spec US Guide  08/16/2013   CLINICAL DATA:  Spiculated mass with microcalcifications at 6 o'clock 5 cm from the right nipple.  EXAM: ULTRASOUND GUIDED RIGHT BREAST CORE NEEDLE BIOPSY WITH VACUUM ASSIST  COMPARISON:  Previous exams.  PROCEDURE: I met with the patient and we discussed the procedure of ultrasound-guided biopsy,  including  benefits and alternatives. We discussed the high likelihood of a successful procedure. We discussed the risks of the procedure including infection, bleeding, tissue injury, clip migration, and inadequate sampling. Informed written consent was given. The usual time-out protocol was performed immediately prior to the procedure.  Using sterile technique, 2% lidocaine for local anesthesia, and ultrasound guidance, a 12 gauge vacuum-assisteddevice was used to perform biopsy of the mass at 6 o'clock in the right breast using a College cranial approach. At the conclusion of the procedure, a coil tissue marker clip was deployed into the biopsy cavity. Follow-up 2-view mammogram was performed and dictated separately.  IMPRESSION: Ultrasound-guided biopsy of a spiculated mass with microcalcifications at 6 o'clock 5 cm from the right nipple. No apparent complications.   Electronically Signed   By: Ulyess Blossom M.D.   On: 08/16/2013 10:46   [place] Rt Breast Bx W Loc Dev Ea Add Lesion Img Bx Spec US Guide  08/16/2013   CLINICAL DATA:  Abnormal right axillary lymph node.  EXAM: ULTRASOUND GUIDED RIGHT AXILLA CORE NEEDLE BIOPSY  COMPARISON:  Previous exams.  FINDINGS: I met with the patient and we discussed the procedure of ultrasound-guided biopsy, including benefits and alternatives. We discussed the high likelihood of a successful procedure. We discussed the risks of the procedure, including infection, bleeding, tissue injury, clip migration, and inadequate sampling. Informed written consent was given. The usual time-out protocol was performed immediately prior to the procedure.  Using sterile technique, 2% Lidocaine as local anesthetic, and ultrasound guidance, a 14 gauge spring-loadeddevice was used to perform biopsy of the abnormal right axillary lymph node using an oblique lateromedial approach.  IMPRESSION: Ultrasound guided biopsy of an abnormal right axillary lymph node. No apparent complications.    Electronically Signed   By: Ulyess Blossom M.D.   On: 08/16/2013 10:45     LABS:    Chemistry      Component Value Date/Time   NA 142 10/02/2013 1003   NA 138 08/30/2013 0916   K 3.4* 10/02/2013 1003   K 4.0 08/30/2013 0916   CL 105 08/30/2013 0916   CO2 21* 10/02/2013 1003   CO2 21 08/30/2013 0916   BUN 8.7 10/02/2013 1003   BUN 10 08/30/2013 0916   CREATININE 0.8 10/02/2013 1003   CREATININE 0.60 08/30/2013 0916      Component Value Date/Time   CALCIUM 9.2 10/02/2013 1003   CALCIUM 9.3 08/30/2013 0916   ALKPHOS 108 10/02/2013 1003   AST 26 10/02/2013 1003   ALT 82* 10/02/2013 1003   BILITOT 0.39 10/02/2013 1003      Lab Results  Component Value Date   WBC 11.4* 10/10/2013   HGB 13.6 10/10/2013   HCT 39.3 10/10/2013   MCV 88.8 10/10/2013   PLT 130* 10/10/2013       PATHOLOGY: ADDITIONAL INFORMATION: 1. PROGNOSTIC INDICATORS - ACIS Results: IMMUNOHISTOCHEMICAL AND MORPHOMETRIC ANALYSIS BY THE AUTOMATED CELLULAR IMAGING SYSTEM (ACIS) Estrogen Receptor: 100%, POSITIVE, STRONG STAINING INTENSITY Progesterone Receptor: 100%, POSITIVE, STRONG STAINING INTENSITY Proliferation Marker Ki67: 72% REFERENCE RANGE ESTROGEN RECEPTOR NEGATIVE <1% POSITIVE =>1% PROGESTERONE RECEPTOR NEGATIVE <1% POSITIVE =>1% All controls stained appropriately Enid Cutter MD Pathologist, Electronic Signature ( Signed 08/23/2013) 1. CHROMOGENIC IN-SITU HYBRIDIZATION Results: HER2/NEU BY CISH - SHOWS AMPLIFICATION BY CISH ANALYSIS. RESULT RATIO OF HER2: CEP 17 SIGNALS 5.77 AVERAGE HER2 COPY NUMBER PER CELL 7.50 REFERENCE RANGE 1 of 4 FINAL for Furber, Tykera ([id number]) ADDITIONAL INFORMATION:(continued) NEGATIVE HER2/Chr17 Ratio <2.0 and Average HER2 copy number <4.0 EQUIVOCAL HER2/Chr17  Ratio <2.0 and Average HER2 copy number 4.0 and <6.0 POSITIVE HER2/Chr17 Ratio >=2.0 and/or Average HER2 copy number >=6.0 Enid Cutter MD Pathologist, Electronic Signature ( Signed 08/22/2013) 2.  PROGNOSTIC INDICATORS - ACIS Results: IMMUNOHISTOCHEMICAL AND MORPHOMETRIC ANALYSIS BY THE AUTOMATED CELLULAR IMAGING SYSTEM (ACIS) Estrogen Receptor: 100%, POSITIVE, STRONG STAINING INTENSITY Progesterone Receptor: 99%, POSITIVE, STRONG STAINING INTENSITY Proliferation Marker Ki67: 59% REFERENCE RANGE ESTROGEN RECEPTOR NEGATIVE <1% POSITIVE =>1% PROGESTERONE RECEPTOR NEGATIVE <1% POSITIVE =>1% All controls stained appropriately Enid Cutter MD Pathologist, Electronic Signature ( Signed 08/23/2013) 2. CHROMOGENIC IN-SITU HYBRIDIZATION Results: HER2/NEU BY CISH - SHOWS AMPLIFICATION BY CISH ANALYSIS. RESULT RATIO OF HER2: CEP 17 SIGNALS 5.73 AVERAGE HER2 COPY NUMBER PER CELL 7.45 REFERENCE RANGE NEGATIVE HER2/Chr17 Ratio <2.0 and Average HER2 copy number <4.0 EQUIVOCAL HER2/Chr17 Ratio <2.0 and Average HER2 copy number 4.0 and <6.0 POSITIVE HER2/Chr17 Ratio >=2.0 and/or Average HER2 copy number >=6.0 Enid Cutter MD Pathologist, Electronic Signature ( Signed 08/22/2013) 2 of 4 FINAL for Loewe, Keon ([id number]) FINAL DIAGNOSIS Diagnosis 1. Breast, right, needle core biopsy, mass, 6 o'clock, 5 cm fn - INVASIVE DUCTAL CARCINOMA. - DUCTAL CARCINOMA IN SITU WITH NECROSIS. - SEE COMMENT. 2. Lymph node, needle/core biopsy, right axilla - POSITIVE FOR DUCTAL CARCINOMA. - SEE COMMENT. Microscopic Comment 1. Although definitive grading of breast carcinoma is best done on excision, the features of the invasive tumor from the right 6 o'clock mass, 5 cm from nipple are compatible with a grade II breast carcinoma. Breast prognostic markers will be performed and reported in an addendum. Findings are called to the [facility] on 08/17/13. Dr. Lyndon Code has seen this case in consultation with agreement. 2. Of note, almost the entire needle core biopsy of the specimen submitted a right axillary lymph node demonstrates nests of invasive ductal carcinoma. The findings may  represent a replaced lymph node with tumor. Please correlate with clinical and radiologic impression. A breast prognostic profile will be performed on this second specimen and reported in an addendum. The findings are called to the [facility] on 08/17/13. Dr. Lyndon Code has seen the second specimen in consultation with agreement. (RAH:caf 08/17/13) Willeen Niece MD Pathologist, Electronic Signature (Case signed 08/17/2013) Specimen Gross and Clinical Information Specimen Comment 1. 3 cm palpable spiculated mass with microcalcifications In formalin 9:50, extracted <1 min 2. Abnormal right axillary node In formalin 9:55, extracted <1 min Specimen(s) Obtained: 1. 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  status post completion of cycle 1 neoadjuvant chemotherapy consisting of TCH/perjeta on 09/19/2013 with Neulasta support 09/20/2013. Next chemotherapy scheduled on 10/10/2013 #2 Oral mucositis: Continue Magic mouth wash consists of oral Xylocaine 2%, maalox, Benadryl and Carafate to swish and swallow every 3-4 hours as needed. I have also added the nystatin mouthwash every 4 hours as needed #3 Dehydration secondary to poor by mouth intake: We'll give IV fluids 2 L of half normal saline today. 10 KCl to be added to the first liter of fluids #4 Tachycardia secondary to dehydration-improving when compared to the previous visit: we'll arrange for IV fluids. We'll add magnesium level  to the CMP which has been ordered today  #5 Heavy menstrual periods and cramping: S/p  Zoladex 3.6 mg injection on 09/25/2012 .  #6. Poor po intake: I have encouraged the patient to have boost one can with high proteins of her choice with either vanilla or chocolate flavor to  be taken at least twice daily.  #7 skin rash secondary to questionable Zoladex injection /Taxotere-?Hand -mouth syndrome: I have  instructed the patient to apply Aquaphor cream twice daily and  to take dexamethasone 4 mg twice daily for 2 days followed by 4 mg daily for 2 days #8 Thrombocytopenia secondary to chemotherapy-induced: Monitor CBC   Follow up in 1 week with CBC and differential and CMP      All questions were answered. The patient knows to call the clinic with any problems, questions or concerns. We can certainly see the patient much sooner if necessary.  I spent 20 minutes counseling the patient face to face. The total time spent in the appointment was 30 minutes.   Wilmon Arms , MD Medical/Oncology [facility] [phone] (beeper) [phone] (Office)  10/10/2013, 9:00 AM

## 2013-10-10 NOTE — Progress Notes (Signed)
OFFICE PROGRESS NOTE  CC**  [name],[name], MD [address] [place] [address]  DIAGNOSIS: 43 year old female with new diagnosis of triple positive invasive ductal carcinoma of the right breast diagnosed 08/16/2013  STAGE:  Breast cancer of lower-outer quadrant of right female breast  Primary site: Breast (Right)  Staging method: AJCC 7th Edition  Clinical: Stage IIIA (T3, N1, cM0) signed by Deatra Robinson, MD on 09/21/2013 6:07 PM  Summary: Stage IIIA (T3, N1, cM0)  PRIOR THERAPY: #1patient had a routine screening mammogram performed she was noted to have a large mass in the 6:00 position of the right breast measuring 3 cm for the primary mass with another 2 cm of calcifications anteriorly. She was also noted to have a enlarged lymph node in the right axilla. Patient underwent a needle core biopsy of the primary mass as well as axilla. The tumor was ER positive PR positive HER-2 positive with a proliferation marker Ki-67 59%. Patient had MRI of the breasts performed MRI showed right mass to measure 8.2 x 5.6 x 3.1 cm lymph node measured 2.5 cm on the left breast there was a 9 millimeter area of concern.  2 patient was begun on neoadjuvant chemotherapy consisting of Taxotere, carboplatin, Herceptin, and perjeta beginning on 09/19/13. Total of 6 cycles of initial therapy are planned.  CURRENT THERAPY: Here for cycle #2 of TCH/perjeta with day 2 Neulasta  INTERVAL HISTORY: Joan Valdez 43 y.o. female returns for followup visit prior to her chemotherapy. Clinically she seems to be doing much better. Although with her initial cycle she did develop diarrhea dehydration and skin rash. She does have significant dryness of the skin. She is also complaining of itching of the scalp as well as pain. She is beginning to lose her hair. She did develop diarrhea from cycle 1 of the regimen. She did require IV fluids. Today I will give her IV fluids today as well as tomorrow to help prevent  dehydration and any other complications. She denies any nausea vomiting fevers chills night sweats she has no shortness of breath chest pains or palpitations no myalgias and arthralgias. She denies having any peripheral paresthesias. Patient did develop sores in the mouth with difficulty swallowing. We discussed better oral hygiene. We also discussed putting her on natural dentist hopefully this will help. We discussed using Vicodin for pain that she is experiencing from her hair falling out and skull sensitivity. Remainder of the 10 point review of systems is negative.  MEDICAL HISTORY: Past Medical History  Diagnosis Date  . PCO (polycystic ovaries)   . Hypothyroid   . Allergic rhinitis   . Chronic headache   . Acid reflux   . Cervical dysplasia   . Stone, kidney     Hx: of  . Cancer     ;Breast cancer; pre-cancerous cervical cells  . 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  MEDICATIONS:  Current Outpatient Prescriptions  Medication Sig Dispense Refill  . cetirizine (ZYRTEC) 10 MG tablet Take 10 mg by mouth at bedtime as needed.       Marland Kitchen dexamethasone (DECADRON) 4 MG tablet Take 2 tablets (8 mg total) by mouth 2 (two) times daily with a meal. Take two times a day the day before Taxotere. Then take two times a day starting the day after chemo for 3 days.  30 tablet  1  . dexlansoprazole (DEXILANT) 60 MG capsule Take 60 mg by mouth daily.      . Eflornithine HCl (VANIQA) 13.9 % cream Apply pea-sized amount as directed  30 g  5  . levothyroxine (SYNTHROID, LEVOTHROID) 100 MCG tablet Take 100 mcg by mouth daily.       Marland Kitchen lidocaine-prilocaine (EMLA) cream Apply topically as needed.  30 g  6  . liothyronine (CYTOMEL) 25 MCG tablet Take 12.5 mcg by mouth 2 (two) times daily.       . Nutritional Supplements (JUICE PLUS FIBRE PO) Take 1  tablet by mouth daily.      . ranitidine (ZANTAC) 300 MG tablet Take 300 mg by mouth at bedtime.      . topiramate (TOPAMAX) 100 MG tablet Take 200 mg by mouth at bedtime.      . Alum & Mag Hydroxide-Simeth (MAGIC MOUTHWASH W/LIDOCAINE) SOLN 5-73ml q4hrs prn. Swish and spit, may swallow for sore throat.  480 mL  3  . dihydroergotamine (MIGRANAL) 4 MG/ML nasal spray Place 1 spray into the nose as needed for migraine. Use in one nostril as directed.  No more than 4 sprays in one hour      . ibuprofen (ADVIL,MOTRIN) 200 MG tablet Take 400 mg by mouth every 8 (eight) hours as needed. For pain      . LORazepam (ATIVAN) 0.5 MG tablet TAKE 1 TABLET BY MOUTH EVERY 6 HOURS AS NEEDED  30 tablet  0  . mometasone (NASONEX) 50 MCG/ACT nasal spray Place 2 sprays into both nostrils daily.       Marland Kitchen nystatin (MYCOSTATIN) 100000 UNIT/ML suspension Take 5 mLs (500,000 Units total) by mouth 4 (four) times daily.  60 mL  0  . ondansetron (ZOFRAN) 8 MG tablet Take 1 tablet (8 mg total) by mouth 2 (two) times daily. Take two times a day starting the day after chemo for 3 days. Then take two times a day as needed for nausea or vomiting.  30 tablet  1  . prochlorperazine (COMPAZINE) 10 MG tablet Take 1 tablet (10 mg total) by mouth every 6 (six) hours as needed (Nausea or vomiting).  30 tablet  1   No current facility-administered medications for this visit.    SURGICAL HISTORY:  Past Surgical History  Procedure Laterality Date  . Cesarean section  2001  . Colposcopy    . Cervical biopsy  w/ loop electrode excision  2002  . Eye surgery      Hx: of metal fragment removed  . Portacath placement N/A 09/04/2013    Procedure: INSERTION PORT-A-CATH;  Surgeon: Merrie Roof, MD;  Location: [facility];  Service: General;  Laterality: N/A;    REVIEW OF SYSTEMS:  Pertinent items are noted in HPI.    PHYSICAL EXAMINATION: Blood pressure 148/83, pulse 96, temperature 98.2 F (36.8 C), temperature source Oral, resp. rate 18,  height $Remov'5\' 2"'hKYFCE$  (1.575 m), weight 208 lb 1.6 oz (94.394 kg). Body mass index is 38.05 kg/(m^2). ECOG PERFORMANCE STATUS: 1 - Symptomatic but completely ambulatory   General appearance: alert, cooperative and appears stated age Neck: no adenopathy, no carotid bruit, no JVD, supple, symmetrical, trachea midline and thyroid not enlarged, symmetric, no tenderness/mass/nodules Lymph nodes: Cervical, supraclavicular, and axillary nodes normal. Resp: clear to auscultation bilaterally Back: symmetric, no curvature. ROM normal. No CVA tenderness. Cardio: regular rate and rhythm GI: soft, non-tender; bowel sounds normal; no masses,  no organomegaly Extremities: extremities normal, atraumatic, no cyanosis or edema  Neurologic: Grossly normal   LABORATORY DATA: Lab Results  Component Value Date   WBC 11.4* 10/10/2013   HGB 13.6 10/10/2013   HCT 39.3 10/10/2013   MCV 88.8 10/10/2013   PLT 130* 10/10/2013      Chemistry      Component Value Date/Time   NA 140 10/10/2013 0802   NA 138 08/30/2013 0916   K 3.7 10/10/2013 0802   K 4.0 08/30/2013 0916   CL 105 08/30/2013 0916   CO2 16* 10/10/2013 0802   CO2 21 08/30/2013 0916   BUN 13.0 10/10/2013 0802   BUN 10 08/30/2013 0916   CREATININE 0.7 10/10/2013 0802   CREATININE 0.60 08/30/2013 0916      Component Value Date/Time   CALCIUM 9.4 10/10/2013 0802   CALCIUM 9.3 08/30/2013 0916   ALKPHOS 105 10/10/2013 0802   AST 24 10/10/2013 0802   ALT 61* 10/10/2013 0802   BILITOT 0.30 10/10/2013 0802       RADIOGRAPHIC STUDIES:  Mm Digital Diagnostic Unilat L  09/12/2013   CLINICAL DATA:  Status post MRI guided left breast biopsy.  EXAM: DIAGNOSTIC LEFT MAMMOGRAM POST ULTRASOUND BIOPSY  COMPARISON:  Previous exams  FINDINGS: Mammographic images were obtained following MRI guided biopsy of left breast. Biopsy marking clip in appropriate position 6 o'clock left breast posterior depth.  IMPRESSION: Appropriate position left breast biopsy marking clip status post  MRI guided core needle biopsy.  Final Assessment: Post Procedure Mammograms for Marker Placement   Electronically Signed   By: Lovey Newcomer M.D.   On: 09/12/2013 11:08   Mr Aundra Millet Breast Bx Johnella Moloney Dev 1st Lesion Image Bx Spec Mr Guide  09/13/2013   ADDENDUM REPORT: 09/13/2013 12:57  ADDENDUM: I spoke with patient by telephone on September 13, 2013 to discuss pathology results. Pathology demonstrates benign fibrocystic change. Patient has no complaints at the biopsy site.  Recommendations:  Followup breast MRI in 6 months.  Treatment plan for known right breast malignancy.   Electronically Signed   By: Lovey Newcomer M.D.   On: 09/13/2013 12:57   09/13/2013   CLINICAL DATA:  Recently diagnosed right breast carcinoma and right axillary metastatic disease. Status post diagnostic MRI with non mass enhancement in the left breast.  EXAM: MRI GUIDED CORE NEEDLE BIOPSY OF THE LEFT BREAST  TECHNIQUE: Multiplanar, multisequence MR imaging of the left breast was performed both before and after administration of intravenous contrast.  CONTRAST:  47mL MULTIHANCE GADOBENATE DIMEGLUMINE 529 MG/ML IV SOLN  COMPARISON:  Previous exams.  FINDINGS: I met with the patient, and we discussed the procedure of MRI guided biopsy, including risks, benefits, and alternatives. Specifically, we discussed the risks of infection, bleeding, tissue injury, clip migration, and inadequate sampling. Informed, written consent was given. The usual time out protocol was performed immediately prior to the procedure.  Using sterile technique, 2% Lidocaine, MRI guidance, and a 9 gauge vacuum assisted device, biopsy was performed of non mass enhancement within the left breast 6 o'clock position using a lateral approach. At the conclusion of the procedure, a dumbbell shaped tissue marker clip was deployed into the biopsy cavity. Follow-up 2-view mammogram was performed and dictated separately.  IMPRESSION: MRI guided biopsy of left breast non mass enhancement 6  o'clock position. No apparent complications.  Electronically Signed: By: Lovey Newcomer M.D. On: 09/12/2013 11:11    ASSESSMENT/PLAN: 43 year old female with  #1 stage IIIa invasive ductal carcinoma of the right breast originally diagnosed December 2014. Tumor was ER positive  PR positive HER-2 positive. She is getting neoadjuvant treatment with TCH/perjeta. Overall she is tolerating it well. She has completed one cycle she is now here for cycle #2. She does develop side effects consisting of diarrhea or mucositis. We have discussed symptomatic management of this. This would include keeping are well-hydrated use of Imodium. We discussed use of mouth rinses. Certainly she could take pain medications if needed.  #2 skin rash: This is improving. We need to keep a better I on this this could possibly be related to either very dry skin or it could be due to the Taxotere. We will continue to monitor this closely.  #3 diarrhea: Patient will receive IV fluids today and tomorrow. She is encouraged to push fluids at home.  #4 scalp pain: Secondary to hair falling out. We discussed symptomatic management. I will also give her a prescription for pain medications to help her through.  #5 patient will be seen back in one week's time for followup and interim lab.   All questions were answered. The patient knows to call the clinic with any problems, questions or concerns. We can certainly see the patient much sooner if necessary.  I spent 20 minutes counseling the patient face to face. The total time spent in the appointment was 30 minutes.    Marcy Panning, MD Medical/Oncology [facility] [phone] (beeper) [phone] (Office)  10/10/2013, 9:08 AM

## 2013-10-10 NOTE — Patient Instructions (Signed)
Use tea tree oil soaks for hands and feet  The natural dentist mouth rinses 4 times a day for mouth sores  vicodin for the scalp pain and massage  Super B complex daily

## 2013-10-10 NOTE — Progress Notes (Signed)
Chaplain made initial visit in infusion room. Pt was present with her husband Event organiser. She stated that she has a long treatment today but that she is otherwise doing well. Chaplain will follow up as necessary.

## 2013-10-10 NOTE — Telephone Encounter (Signed)
appts made and printed. Pt is aware that tx will be added. i emailed MW to add the tx's...td 

## 2013-10-10 NOTE — Telephone Encounter (Signed)
Per staff message and POF I have scheduled appts.  JMW  

## 2013-10-11 ENCOUNTER — Ambulatory Visit (HOSPITAL_BASED_OUTPATIENT_CLINIC_OR_DEPARTMENT_OTHER): Payer: BC Managed Care – PPO

## 2013-10-11 ENCOUNTER — Ambulatory Visit: Payer: BC Managed Care – PPO

## 2013-10-11 VITALS — BP 115/74 | HR 95 | Temp 98.2°F

## 2013-10-11 DIAGNOSIS — C50511 Malignant neoplasm of lower-outer quadrant of right female breast: Secondary | ICD-10-CM

## 2013-10-11 DIAGNOSIS — C773 Secondary and unspecified malignant neoplasm of axilla and upper limb lymph nodes: Secondary | ICD-10-CM

## 2013-10-11 DIAGNOSIS — Z5189 Encounter for other specified aftercare: Secondary | ICD-10-CM

## 2013-10-11 DIAGNOSIS — R197 Diarrhea, unspecified: Secondary | ICD-10-CM

## 2013-10-11 DIAGNOSIS — C50519 Malignant neoplasm of lower-outer quadrant of unspecified female breast: Secondary | ICD-10-CM

## 2013-10-11 MED ORDER — SODIUM CHLORIDE 0.9 % IJ SOLN
10.0000 mL | INTRAMUSCULAR | Status: DC | PRN
  Administered 2013-10-11: 10 mL via INTRAVENOUS
  Filled 2013-10-11: qty 10

## 2013-10-11 MED ORDER — SODIUM CHLORIDE 0.9 % IV SOLN
INTRAVENOUS | Status: DC
  Administered 2013-10-11: 09:00:00 via INTRAVENOUS

## 2013-10-11 MED ORDER — HEPARIN SOD (PORK) LOCK FLUSH 100 UNIT/ML IV SOLN
500.0000 [IU] | Freq: Once | INTRAVENOUS | Status: AC
  Administered 2013-10-11: 500 [IU] via INTRAVENOUS
  Filled 2013-10-11: qty 5

## 2013-10-11 MED ORDER — PEGFILGRASTIM INJECTION 6 MG/0.6ML
6.0000 mg | Freq: Once | SUBCUTANEOUS | Status: AC
  Administered 2013-10-11: 6 mg via SUBCUTANEOUS
  Filled 2013-10-11: qty 0.6

## 2013-10-11 NOTE — Patient Instructions (Signed)
Pegfilgrastim injection What is this medicine? PEGFILGRASTIM (peg fil GRA stim) helps the body make more white blood cells. It is used to prevent infection in people with low amounts of white blood cells following cancer treatment. This medicine may be used for other purposes; ask your health care provider or pharmacist if you have questions. COMMON BRAND NAME(S): Neulasta What should I tell my health care provider before I take this medicine? They need to know if you have any of these conditions: -sickle cell disease -an unusual or allergic reaction to pegfilgrastim, filgrastim, E.coli protein, other medicines, foods, dyes, or preservatives -pregnant or trying to get pregnant -breast-feeding How should I use this medicine? This medicine is for injection under the skin. It is usually given by a health care professional in a hospital or clinic setting. If you get this medicine at home, you will be taught how to prepare and give this medicine. Do not shake this medicine. Use exactly as directed. Take your medicine at regular intervals. Do not take your medicine more often than directed. It is important that you put your used needles and syringes in a special sharps container. Do not put them in a trash can. If you do not have a sharps container, call your pharmacist or healthcare provider to get one. Talk to your pediatrician regarding the use of this medicine in children. While this drug may be prescribed for children who weigh more than 45 kg for selected conditions, precautions do apply Overdosage: If you think you have taken too much of this medicine contact a poison control center or emergency room at once. NOTE: This medicine is only for you. Do not share this medicine with others. What if I miss a dose? If you miss a dose, take it as soon as you can. If it is almost time for your next dose, take only that dose. Do not take double or extra doses. What may interact with this  medicine? -lithium -medicines for growth therapy This list may not describe all possible interactions. Give your health care provider a list of all the medicines, herbs, non-prescription drugs, or dietary supplements you use. Also tell them if you smoke, drink alcohol, or use illegal drugs. Some items may interact with your medicine. What should I watch for while using this medicine? Visit your doctor for regular check ups. You will need important blood work done while you are taking this medicine. What side effects may I notice from receiving this medicine? Side effects that you should report to your doctor or health care professional as soon as possible: -allergic reactions like skin rash, itching or hives, swelling of the face, lips, or tongue -breathing problems -fever -pain, redness, or swelling where injected -shoulder pain -stomach or side pain Side effects that usually do not require medical attention (report to your doctor or health care professional if they continue or are bothersome): -aches, pains -headache -loss of appetite -nausea, vomiting -unusually tired This list may not describe all possible side effects. Call your doctor for medical advice about side effects. You may report side effects to FDA at 1-800-FDA-1088. Where should I keep my medicine? Keep out of the reach of children. Store in a refrigerator between 2 and 8 degrees C (36 and 46 degrees F). Do not freeze. Keep in carton to protect from light. Throw away this medicine if it is left out of the refrigerator for more than 48 hours. Throw away any unused medicine after the expiration date. NOTE: This sheet is   a summary. It may not cover all possible information. If you have questions about this medicine, talk to your doctor, pharmacist, or health care provider.  2014, Elsevier/Gold Standard. (2008-04-02 15:41:44) Dehydration, Adult Dehydration is when you lose more fluids from the body than you take in. Vital  organs like the kidneys, brain, and heart cannot function without a proper amount of fluids and salt. Any loss of fluids from the body can cause dehydration.  CAUSES   Vomiting.  Diarrhea.  Excessive sweating.  Excessive urine output.  Fever. SYMPTOMS  Mild dehydration  Thirst.  Dry lips.  Slightly dry mouth. Moderate dehydration  Very dry mouth.  Sunken eyes.  Skin does not bounce back quickly when lightly pinched and released.  Dark urine and decreased urine production.  Decreased tear production.  Headache. Severe dehydration  Very dry mouth.  Extreme thirst.  Rapid, weak pulse (more than 100 beats per minute at rest).  Cold hands and feet.  Not able to sweat in spite of heat and temperature.  Rapid breathing.  Blue lips.  Confusion and lethargy.  Difficulty being awakened.  Minimal urine production.  No tears. DIAGNOSIS  Your caregiver will diagnose dehydration based on your symptoms and your exam. Blood and urine tests will help confirm the diagnosis. The diagnostic evaluation should also identify the cause of dehydration. TREATMENT  Treatment of mild or moderate dehydration can often be done at home by increasing the amount of fluids that you drink. It is best to drink small amounts of fluid more often. Drinking too much at one time can make vomiting worse. Refer to the home care instructions below. Severe dehydration needs to be treated at the hospital where you will probably be given intravenous (IV) fluids that contain water and electrolytes. HOME CARE INSTRUCTIONS   Ask your caregiver about specific rehydration instructions.  Drink enough fluids to keep your urine clear or pale yellow.  Drink small amounts frequently if you have nausea and vomiting.  Eat as you normally do.  Avoid:  Foods or drinks high in sugar.  Carbonated drinks.  Juice.  Extremely hot or cold fluids.  Drinks with caffeine.  Fatty, greasy  foods.  Alcohol.  Tobacco.  Overeating.  Gelatin desserts.  Wash your hands well to avoid spreading bacteria and viruses.  Only take over-the-counter or prescription medicines for pain, discomfort, or fever as directed by your caregiver.  Ask your caregiver if you should continue all prescribed and over-the-counter medicines.  Keep all follow-up appointments with your caregiver. SEEK MEDICAL CARE IF:  You have abdominal pain and it increases or stays in one area (localizes).  You have a rash, stiff neck, or severe headache.  You are irritable, sleepy, or difficult to awaken.  You are weak, dizzy, or extremely thirsty. SEEK IMMEDIATE MEDICAL CARE IF:   You are unable to keep fluids down or you get worse despite treatment.  You have frequent episodes of vomiting or diarrhea.  You have blood or green matter (bile) in your vomit.  You have blood in your stool or your stool looks black and tarry.  You have not urinated in 6 to 8 hours, or you have only urinated a small amount of very dark urine.  You have a fever.  You faint. MAKE SURE YOU:   Understand these instructions.  Will watch your condition.  Will get help right away if you are not doing well or get worse. Document Released: 08/31/2005 Document Revised: 11/23/2011 Document Reviewed: 04/20/2011 Exit[employer] Patient   Information 2014 [employer].  

## 2013-10-17 ENCOUNTER — Telehealth: Payer: Self-pay | Admitting: Oncology

## 2013-10-17 ENCOUNTER — Other Ambulatory Visit (HOSPITAL_BASED_OUTPATIENT_CLINIC_OR_DEPARTMENT_OTHER): Payer: BC Managed Care – PPO

## 2013-10-17 ENCOUNTER — Encounter: Payer: Self-pay | Admitting: Oncology

## 2013-10-17 ENCOUNTER — Telehealth: Payer: Self-pay | Admitting: *Deleted

## 2013-10-17 ENCOUNTER — Ambulatory Visit (HOSPITAL_BASED_OUTPATIENT_CLINIC_OR_DEPARTMENT_OTHER): Payer: BC Managed Care – PPO

## 2013-10-17 ENCOUNTER — Ambulatory Visit (HOSPITAL_BASED_OUTPATIENT_CLINIC_OR_DEPARTMENT_OTHER): Payer: BC Managed Care – PPO | Admitting: Oncology

## 2013-10-17 VITALS — BP 127/83 | HR 128 | Temp 98.0°F | Resp 19 | Ht 62.0 in | Wt 205.5 lb

## 2013-10-17 DIAGNOSIS — C50511 Malignant neoplasm of lower-outer quadrant of right female breast: Secondary | ICD-10-CM

## 2013-10-17 DIAGNOSIS — E860 Dehydration: Secondary | ICD-10-CM

## 2013-10-17 DIAGNOSIS — R21 Rash and other nonspecific skin eruption: Secondary | ICD-10-CM

## 2013-10-17 DIAGNOSIS — E876 Hypokalemia: Secondary | ICD-10-CM

## 2013-10-17 DIAGNOSIS — C773 Secondary and unspecified malignant neoplasm of axilla and upper limb lymph nodes: Secondary | ICD-10-CM

## 2013-10-17 DIAGNOSIS — Z170 Estrogen receptor positive status [ER+]: Secondary | ICD-10-CM

## 2013-10-17 DIAGNOSIS — C50519 Malignant neoplasm of lower-outer quadrant of unspecified female breast: Secondary | ICD-10-CM

## 2013-10-17 DIAGNOSIS — R197 Diarrhea, unspecified: Secondary | ICD-10-CM

## 2013-10-17 LAB — CBC WITH DIFFERENTIAL/PLATELET
BASO%: 0.5 % (ref 0.0–2.0)
BASOS ABS: 0.1 10*3/uL (ref 0.0–0.1)
EOS ABS: 0 10*3/uL (ref 0.0–0.5)
EOS%: 0.2 % (ref 0.0–7.0)
HCT: 41 % (ref 34.8–46.6)
HEMOGLOBIN: 14.4 g/dL (ref 11.6–15.9)
LYMPH%: 39.1 % (ref 14.0–49.7)
MCH: 30.3 pg (ref 25.1–34.0)
MCHC: 35.2 g/dL (ref 31.5–36.0)
MCV: 86.2 fL (ref 79.5–101.0)
MONO#: 1.9 10*3/uL — ABNORMAL HIGH (ref 0.1–0.9)
MONO%: 12.9 % (ref 0.0–14.0)
NEUT#: 6.8 10*3/uL — ABNORMAL HIGH (ref 1.5–6.5)
NEUT%: 47.3 % (ref 38.4–76.8)
Platelets: 103 10*3/uL — ABNORMAL LOW (ref 145–400)
RBC: 4.76 10*6/uL (ref 3.70–5.45)
RDW: 13.8 % (ref 11.2–14.5)
WBC: 14.4 10*3/uL — ABNORMAL HIGH (ref 3.9–10.3)
lymph#: 5.6 10*3/uL — ABNORMAL HIGH (ref 0.9–3.3)

## 2013-10-17 LAB — COMPREHENSIVE METABOLIC PANEL (CC13)
ALT: 25 U/L (ref 0–55)
AST: 18 U/L (ref 5–34)
Albumin: 3.7 g/dL (ref 3.5–5.0)
Alkaline Phosphatase: 127 U/L (ref 40–150)
Anion Gap: 16 mEq/L — ABNORMAL HIGH (ref 3–11)
BILIRUBIN TOTAL: 0.57 mg/dL (ref 0.20–1.20)
BUN: 10 mg/dL (ref 7.0–26.0)
CO2: 22 mEq/L (ref 22–29)
CREATININE: 0.8 mg/dL (ref 0.6–1.1)
Calcium: 9.3 mg/dL (ref 8.4–10.4)
Chloride: 101 mEq/L (ref 98–109)
GLUCOSE: 140 mg/dL (ref 70–140)
POTASSIUM: 2.8 meq/L — AB (ref 3.5–5.1)
Sodium: 139 mEq/L (ref 136–145)
Total Protein: 6.4 g/dL (ref 6.4–8.3)

## 2013-10-17 MED ORDER — SODIUM CHLORIDE 0.9 % IV SOLN
INTRAVENOUS | Status: DC
  Administered 2013-10-17: 09:00:00 via INTRAVENOUS

## 2013-10-17 MED ORDER — SODIUM CHLORIDE 0.9 % IJ SOLN
10.0000 mL | INTRAMUSCULAR | Status: DC | PRN
  Administered 2013-10-17: 10 mL via INTRAVENOUS
  Filled 2013-10-17: qty 10

## 2013-10-17 MED ORDER — HEPARIN SOD (PORK) LOCK FLUSH 100 UNIT/ML IV SOLN
500.0000 [IU] | Freq: Once | INTRAVENOUS | Status: AC
  Administered 2013-10-17: 500 [IU] via INTRAVENOUS
  Filled 2013-10-17: qty 5

## 2013-10-17 MED ORDER — SODIUM CHLORIDE 0.9 % IV SOLN
INTRAVENOUS | Status: DC
  Administered 2013-10-17: 10:00:00 via INTRAVENOUS
  Filled 2013-10-17: qty 1000

## 2013-10-17 NOTE — Telephone Encounter (Signed)
Per staff phone call and POF I have schedueld appts.  JMW  

## 2013-10-17 NOTE — Progress Notes (Signed)
OFFICE PROGRESS NOTE  CC**  [name],[name], MD [address] [place] [address]  DIAGNOSIS: 43 year old female with new diagnosis of triple positive invasive ductal carcinoma of the right breast diagnosed 08/16/2013  STAGE:  Breast cancer of lower-outer quadrant of right female breast  Primary site: Breast (Right)  Staging method: AJCC 7th Edition  Clinical: Stage IIIA (T3, N1, cM0) signed by Deatra Robinson, MD on 09/21/2013 6:07 PM  Summary: Stage IIIA (T3, N1, cM0)  PRIOR THERAPY: #1patient had a routine screening mammogram performed she was noted to have a large mass in the 6:00 position of the right breast measuring 3 cm for the primary mass with another 2 cm of calcifications anteriorly. She was also noted to have a enlarged lymph node in the right axilla. Patient underwent a needle core biopsy of the primary mass as well as axilla. The tumor was ER positive PR positive HER-2 positive with a proliferation marker Ki-67 59%. Patient had MRI of the breasts performed MRI showed right mass to measure 8.2 x 5.6 x 3.1 cm lymph node measured 2.5 cm on the left breast there was a 9 millimeter area of concern.  2 patient was begun on neoadjuvant chemotherapy consisting of Taxotere, carboplatin, Herceptin, and perjeta beginning on 09/19/13. Total of 6 cycles of initial therapy are planned.  CURRENT THERAPY: S/P cycle #2 of TCH/perjeta with day 2 Neulasta administered 1/27  INTERVAL HISTORY: Joan Valdez 43 y.o. female returns for followup visit. Overall she tolerated the chemotherapy well. However she does tell me that she feels very weak tired and fatigued and clammy. She has not had any fevers at home no chills or night sweats. She denies having any headaches double vision or blurring of vision. She denies having any pain. She has no difficulty with swallowing today. Patient did notice a small rash after she received her Zoladex injection on January 12. She has not had any  bleeding problems no rashes. Remainder of the 10 point review of systems is negative.  MEDICAL HISTORY: Past Medical History  Diagnosis Date  . PCO (polycystic ovaries)   . Hypothyroid   . Allergic rhinitis   . Chronic headache   . Acid reflux   . Cervical dysplasia   . Stone, kidney     Hx: of  . Cancer     ;Breast cancer; pre-cancerous cervical cells  . 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  MEDICATIONS:  Current Outpatient Prescriptions  Medication Sig Dispense Refill  . Alum & Mag Hydroxide-Simeth (MAGIC MOUTHWASH W/LIDOCAINE) SOLN 5-25ml q4hrs prn. Swish and spit, may swallow for sore throat.  480 mL  3  . cetirizine (ZYRTEC) 10 MG tablet Take 10 mg by mouth at bedtime as needed.       Marland Kitchen dexamethasone (DECADRON) 4 MG tablet Take 2 tablets (8 mg total) by mouth 2 (two) times daily with a meal. Take two times a day the day before Taxotere. Then take two times a day starting the day after chemo for 3 days.  30 tablet  1  . dexlansoprazole (DEXILANT) 60 MG capsule Take 60 mg by mouth daily.      Marland Kitchen dihydroergotamine (MIGRANAL) 4 MG/ML nasal spray Place 1 spray into the nose as needed for migraine. Use in one nostril as directed.  No more than 4 sprays in one hour      .  Eflornithine HCl (VANIQA) 13.9 % cream Apply pea-sized amount as directed  30 g  5  . ibuprofen (ADVIL,MOTRIN) 200 MG tablet Take 400 mg by mouth every 8 (eight) hours as needed. For pain      . levothyroxine (SYNTHROID, LEVOTHROID) 100 MCG tablet Take 100 mcg by mouth daily.       Marland Kitchen lidocaine-prilocaine (EMLA) cream Apply topically as needed.  30 g  6  . liothyronine (CYTOMEL) 25 MCG tablet Take 12.5 mcg by mouth 2 (two) times daily.       Marland Kitchen LORazepam (ATIVAN) 0.5 MG tablet TAKE 1 TABLET BY MOUTH EVERY 6 HOURS AS NEEDED  30 tablet  0  . mometasone (NASONEX)  50 MCG/ACT nasal spray Place 2 sprays into both nostrils daily.       . Nutritional Supplements (JUICE PLUS FIBRE PO) Take 1 tablet by mouth daily.      Marland Kitchen nystatin (MYCOSTATIN) 100000 UNIT/ML suspension Take 5 mLs (500,000 Units total) by mouth 4 (four) times daily.  60 mL  0  . ondansetron (ZOFRAN) 8 MG tablet Take 1 tablet (8 mg total) by mouth 2 (two) times daily. Take two times a day starting the day after chemo for 3 days. Then take two times a day as needed for nausea or vomiting.  30 tablet  1  . oxyCODONE-acetaminophen (PERCOCET/ROXICET) 5-325 MG per tablet Take 1-2 tablets by mouth every 4 (four) hours as needed for severe pain.  30 tablet  0  . prochlorperazine (COMPAZINE) 10 MG tablet Take 1 tablet (10 mg total) by mouth every 6 (six) hours as needed (Nausea or vomiting).  30 tablet  1  . ranitidine (ZANTAC) 300 MG tablet Take 300 mg by mouth at bedtime.      . topiramate (TOPAMAX) 100 MG tablet Take 200 mg by mouth at bedtime.       No current facility-administered medications for this visit.    SURGICAL HISTORY:  Past Surgical History  Procedure Laterality Date  . Cesarean section  2001  . Colposcopy    . Cervical biopsy  w/ loop electrode excision  2002  . Eye surgery      Hx: of metal fragment removed  . Portacath placement N/A 09/04/2013    Procedure: INSERTION PORT-A-CATH;  Surgeon: Robyne Askew, MD;  Location[facility];  Service: General;  Laterality: N/A;    REVIEW OF SYSTEMS:  Pertinent items are noted in HPI.    PHYSICAL EXAMINATION: Blood pressure 127/83, pulse 128, temperature 98 F (36.7 C), temperature source Oral, resp. rate 19, height 5\' 2"  (1.575 m), weight 205 lb 8 oz (93.214 kg). Body mass index is 37.58 kg/(m^2). ECOG PERFORMANCE STATUS: 1 - Symptomatic but completely ambulatory   General appearance: alert, cooperative and appears stated age Neck: no adenopathy, no carotid bruit, no JVD, supple, symmetrical, trachea midline and thyroid not enlarged,  symmetric, no tenderness/mass/nodules Lymph nodes: Cervical, supraclavicular, and axillary nodes normal. Resp: clear to auscultation bilaterally Back: symmetric, no curvature. ROM normal. No CVA tenderness. Cardio: regular rate and rhythm GI: soft, non-tender; bowel sounds normal; no masses,  no organomegaly Extremities: extremities normal, atraumatic, no cyanosis or edema Neurologic: Grossly normal   LABORATORY DATA: Lab Results  Component Value Date   WBC 14.4* 10/17/2013   HGB 14.4 10/17/2013   HCT 41.0 10/17/2013   MCV 86.2 10/17/2013   PLT 103* 10/17/2013      Chemistry      Component Value Date/Time   NA 140  10/10/2013 0802   NA 138 08/30/2013 0916   K 3.7 10/10/2013 0802   K 4.0 08/30/2013 0916   CL 105 08/30/2013 0916   CO2 16* 10/10/2013 0802   CO2 21 08/30/2013 0916   BUN 13.0 10/10/2013 0802   BUN 10 08/30/2013 0916   CREATININE 0.7 10/10/2013 0802   CREATININE 0.60 08/30/2013 0916      Component Value Date/Time   CALCIUM 9.4 10/10/2013 0802   CALCIUM 9.3 08/30/2013 0916   ALKPHOS 105 10/10/2013 0802   AST 24 10/10/2013 0802   ALT 61* 10/10/2013 0802   BILITOT 0.30 10/10/2013 0802       RADIOGRAPHIC STUDIES:  Mm Digital Diagnostic Unilat L  09/12/2013   CLINICAL DATA:  Status post MRI guided left breast biopsy.  EXAM: DIAGNOSTIC LEFT MAMMOGRAM POST ULTRASOUND BIOPSY  COMPARISON:  Previous exams  FINDINGS: Mammographic images were obtained following MRI guided biopsy of left breast. Biopsy marking clip in appropriate position 6 o'clock left breast posterior depth.  IMPRESSION: Appropriate position left breast biopsy marking clip status post MRI guided core needle biopsy.  Final Assessment: Post Procedure Mammograms for Marker Placement   Electronically Signed   By: Lovey Newcomer M.D.   On: 09/12/2013 11:08   Mr Aundra Millet Breast Bx Johnella Moloney Dev 1st Lesion Image Bx Spec Mr Guide  09/13/2013   ADDENDUM REPORT: 09/13/2013 12:57  ADDENDUM: I spoke with patient by telephone on September 13, 2013 to discuss pathology results. Pathology demonstrates benign fibrocystic change. Patient has no complaints at the biopsy site.  Recommendations:  Followup breast MRI in 6 months.  Treatment plan for known right breast malignancy.   Electronically Signed   By: Lovey Newcomer M.D.   On: 09/13/2013 12:57   09/13/2013   CLINICAL DATA:  Recently diagnosed right breast carcinoma and right axillary metastatic disease. Status post diagnostic MRI with non mass enhancement in the left breast.  EXAM: MRI GUIDED CORE NEEDLE BIOPSY OF THE LEFT BREAST  TECHNIQUE: Multiplanar, multisequence MR imaging of the left breast was performed both before and after administration of intravenous contrast.  CONTRAST:  59mL MULTIHANCE GADOBENATE DIMEGLUMINE 529 MG/ML IV SOLN  COMPARISON:  Previous exams.  FINDINGS: I met with the patient, and we discussed the procedure of MRI guided biopsy, including risks, benefits, and alternatives. Specifically, we discussed the risks of infection, bleeding, tissue injury, clip migration, and inadequate sampling. Informed, written consent was given. The usual time out protocol was performed immediately prior to the procedure.  Using sterile technique, 2% Lidocaine, MRI guidance, and a 9 gauge vacuum assisted device, biopsy was performed of non mass enhancement within the left breast 6 o'clock position using a lateral approach. At the conclusion of the procedure, a dumbbell shaped tissue marker clip was deployed into the biopsy cavity. Follow-up 2-view mammogram was performed and dictated separately.  IMPRESSION: MRI guided biopsy of left breast non mass enhancement 6 o'clock position. No apparent complications.  Electronically Signed: By: Lovey Newcomer M.D. On: 09/12/2013 11:11    ASSESSMENT/PLAN: 43 year old female with  #1 stage IIIa invasive ductal carcinoma of the right breast originally diagnosed December 2014. Tumor was ER positive PR positive HER-2 positive. She is getting neoadjuvant treatment  with TCH/perjeta. Overall she is tolerating it well. Patient is status post cycle 2 of TCH/perjeta.  #2 skin rash:   #3 diarrhea: She has not had any diarrhea with this dose  #4 dehydration: Patient will be given IV fluids 1 L today.  #5  hypokalemia: We will give her potassium with her IV fluids today. She will also need oral supplementation. We will however weight tell her next visit and see where her levels are.  #6 menstrual cycles: She was begun on Zoladex on 09/25/2013. We will continue this every 28 days.  #7 patient will be seen back in 2 weeks' time for followup and cycle #3 of chemotherapy.   All questions were answered. The patient knows to call the clinic with any problems, questions or concerns. We can certainly see the patient much sooner if necessary.  I spent 20 minutes counseling the patient face to face. The total time spent in the appointment was 30 minutes.    Marcy Panning, MD Medical/Oncology [facility] [phone] (beeper) [phone] (Office)  10/17/2013, 8:50 AM

## 2013-10-17 NOTE — Telephone Encounter (Signed)
, °

## 2013-10-17 NOTE — Patient Instructions (Signed)
Dehydration, Adult  Dehydration means your body does not have as much fluid as it needs. Your kidneys, brain, and heart will not work properly without the right amount of fluids and salt.   HOME CARE   Ask your doctor how to replace body fluid losses (rehydrate).   Drink enough fluids to keep your pee (urine) clear or pale yellow.   Drink small amounts of fluids often if you feel sick to your stomach (nauseous) or throw up (vomit).   Eat like you normally do.   Avoid:   Foods or drinks high in sugar.   Bubbly (carbonated) drinks.   Juice.   Very hot or cold fluids.   Drinks with caffeine.   Fatty, greasy foods.   Alcohol.   Tobacco.   Eating too much.   Gelatin desserts.   Wash your hands to avoid spreading germs (bacteria, viruses).   Only take medicine as told by your doctor.   Keep all doctor visits as told.  GET HELP RIGHT AWAY IF:    You cannot drink something without throwing up.   You get worse even with treatment.   Your vomit has blood in it or looks greenish.   Your poop (stool) has blood in it or looks black and tarry.   You have not peed in 6 to 8 hours.   You pee a small amount of very dark pee.   You have a fever.   You pass out (faint).   You have belly (abdominal) pain that gets worse or stays in one spot (localizes).   You have a rash, stiff neck, or bad headache.   You get easily annoyed, sleepy, or are hard to wake up.   You feel weak, dizzy, or very thirsty.  MAKE SURE YOU:    Understand these instructions.   Will watch your condition.   Will get help right away if you are not doing well or get worse.  Document Released: 06/27/2009 Document Revised: 11/23/2011 Document Reviewed: 04/20/2011  [employer] Patient Information 2014 [employer].

## 2013-10-17 NOTE — Patient Instructions (Signed)
Hypokalemia Hypokalemia means that the amount of potassium in the blood is lower than normal.Potassium is a chemical, called an electrolyte, that helps regulate the amount of fluid in the body. It also stimulates muscle contraction and helps nerves function properly.Most of the body's potassium is inside of cells, and only a very small amount is in the blood. Because the amount in the blood is so small, minor changes can be life-threatening. CAUSES  Antibiotics.  Diarrhea or vomiting.  Using laxatives too much, which can cause diarrhea.  Chronic kidney disease.  Water pills (diuretics).  Eating disorders (bulimia).  Low magnesium level.  Sweating a lot. SIGNS AND SYMPTOMS  Weakness.  Constipation.  Fatigue.  Muscle cramps.  Mental confusion.  Skipped heartbeats or irregular heartbeat (palpitations).  Tingling or numbness. DIAGNOSIS  Your health care provider can diagnose hypokalemia with blood tests. In addition to checking your potassium level, your health care provider may also check other lab tests. TREATMENT Hypokalemia can be treated with potassium supplements taken by mouth or adjustments in your current medicines. If your potassium level is very low, you may need to get potassium through a vein (IV) and be monitored in the hospital. A diet high in potassium is also helpful. Foods high in potassium are:  Nuts, such as peanuts and pistachios.  Seeds, such as sunflower seeds and pumpkin seeds.  Peas, lentils, and lima beans.  Whole grain and bran cereals and breads.  Fresh fruit and vegetables, such as apricots, avocado, bananas, cantaloupe, kiwi, oranges, tomatoes, asparagus, and potatoes.  Orange and tomato juices.  Red meats.  Fruit yogurt. HOME CARE INSTRUCTIONS  Take all medicines as prescribed by your health care provider.  Maintain a healthy diet by including nutritious food, such as fruits, vegetables, nuts, whole grains, and lean meats.  If  you are taking a laxative, be sure to follow the directions on the label. SEEK MEDICAL CARE IF:  Your weakness gets worse.  You feel your heart pounding or racing.  You are vomiting or having diarrhea.  You are diabetic and having trouble keeping your blood glucose in the normal range. SEEK IMMEDIATE MEDICAL CARE IF:  You have chest pain, shortness of breath, or dizziness.  You are vomiting or having diarrhea for more than 2 days.  You faint. MAKE SURE YOU:   Understand these instructions.  Will watch your condition.  Will get help right away if you are not doing well or get worse. Document Released: 08/31/2005 Document Revised: 06/21/2013 Document Reviewed: 03/03/2013 [facility] Patient Information 2014 [place].  Dehydration, Adult Dehydration is when you lose more fluids from the body than you take in. Vital organs like the kidneys, brain, and heart cannot function without a proper amount of fluids and salt. Any loss of fluids from the body can cause dehydration.  CAUSES   Vomiting.  Diarrhea.  Excessive sweating.  Excessive urine output.  Fever. SYMPTOMS  Mild dehydration  Thirst.  Dry lips.  Slightly dry mouth. Moderate dehydration  Very dry mouth.  Sunken eyes.  Skin does not bounce back quickly when lightly pinched and released.  Dark urine and decreased urine production.  Decreased tear production.  Headache. Severe dehydration  Very dry mouth.  Extreme thirst.  Rapid, weak pulse (more than 100 beats per minute at rest).  Cold hands and feet.  Not able to sweat in spite of heat and temperature.  Rapid breathing.  Blue lips.  Confusion and lethargy.  Difficulty being awakened.  Minimal urine production.  No tears. DIAGNOSIS  Your caregiver will diagnose dehydration based on your symptoms and your exam. Blood and urine tests will help confirm the diagnosis. The diagnostic evaluation should also identify the cause of  dehydration. TREATMENT  Treatment of mild or moderate dehydration can often be done at home by increasing the amount of fluids that you drink. It is best to drink small amounts of fluid more often. Drinking too much at one time can make vomiting worse. Refer to the home care instructions below. Severe dehydration needs to be treated at the hospital where you will probably be given intravenous (IV) fluids that contain water and electrolytes. HOME CARE INSTRUCTIONS   Ask your caregiver about specific rehydration instructions.  Drink enough fluids to keep your urine clear or pale yellow.  Drink small amounts frequently if you have nausea and vomiting.  Eat as you normally do.  Avoid:  Foods or drinks high in sugar.  Carbonated drinks.  Juice.  Extremely hot or cold fluids.  Drinks with caffeine.  Fatty, greasy foods.  Alcohol.  Tobacco.  Overeating.  Gelatin desserts.  Wash your hands well to avoid spreading bacteria and viruses.  Only take over-the-counter or prescription medicines for pain, discomfort, or fever as directed by your caregiver.  Ask your caregiver if you should continue all prescribed and over-the-counter medicines.  Keep all follow-up appointments with your caregiver. SEEK MEDICAL CARE IF:  You have abdominal pain and it increases or stays in one area (localizes).  You have a rash, stiff neck, or severe headache.  You are irritable, sleepy, or difficult to awaken.  You are weak, dizzy, or extremely thirsty. SEEK IMMEDIATE MEDICAL CARE IF:   You are unable to keep fluids down or you get worse despite treatment.  You have frequent episodes of vomiting or diarrhea.  You have blood or green matter (bile) in your vomit.  You have blood in your stool or your stool looks black and tarry.  You have not urinated in 6 to 8 hours, or you have only urinated a small amount of very dark urine.  You have a fever.  You faint. MAKE SURE YOU:    Understand these instructions.  Will watch your condition.  Will get help right away if you are not doing well or get worse. Document Released: 08/31/2005 Document Revised: 11/23/2011 Document Reviewed: 04/20/2011 [facility] Patient Information 2014 [place], [place].

## 2013-10-20 ENCOUNTER — Encounter: Payer: Self-pay | Admitting: *Deleted

## 2013-10-24 ENCOUNTER — Other Ambulatory Visit: Payer: Self-pay | Admitting: Oncology

## 2013-10-26 ENCOUNTER — Ambulatory Visit (HOSPITAL_BASED_OUTPATIENT_CLINIC_OR_DEPARTMENT_OTHER): Payer: BC Managed Care – PPO

## 2013-10-26 ENCOUNTER — Encounter: Payer: Self-pay | Admitting: Oncology

## 2013-10-26 VITALS — BP 142/79 | HR 116 | Temp 98.4°F

## 2013-10-26 DIAGNOSIS — C50519 Malignant neoplasm of lower-outer quadrant of unspecified female breast: Secondary | ICD-10-CM

## 2013-10-26 DIAGNOSIS — C50511 Malignant neoplasm of lower-outer quadrant of right female breast: Secondary | ICD-10-CM

## 2013-10-26 DIAGNOSIS — Z5111 Encounter for antineoplastic chemotherapy: Secondary | ICD-10-CM

## 2013-10-26 MED ORDER — GOSERELIN ACETATE 3.6 MG ~~LOC~~ IMPL
3.6000 mg | DRUG_IMPLANT | Freq: Once | SUBCUTANEOUS | Status: AC
  Administered 2013-10-26: 3.6 mg via SUBCUTANEOUS
  Filled 2013-10-26: qty 3.6

## 2013-10-26 NOTE — Telephone Encounter (Signed)
RECEIVED A FAX FROM [facility] CONCERNING A PRIOR AUTHORIZATION FOR ONDANSETRON. THIS REQUEST WAS PLACED IN THE MANAGED CARE BIN. 

## 2013-10-26 NOTE — Progress Notes (Signed)
[facility], [id number], approved ondansetron 8mg  from 09/26/13-10/26/14

## 2013-10-27 ENCOUNTER — Encounter: Payer: Self-pay | Admitting: Oncology

## 2013-10-27 ENCOUNTER — Other Ambulatory Visit: Payer: Self-pay | Admitting: Oncology

## 2013-10-27 DIAGNOSIS — C50519 Malignant neoplasm of lower-outer quadrant of unspecified female breast: Secondary | ICD-10-CM

## 2013-10-27 MED ORDER — LORAZEPAM 0.5 MG PO TABS: ORAL_TABLET | ORAL | Status: DC

## 2013-10-27 MED ORDER — OXYCODONE-ACETAMINOPHEN 5-325 MG PO TABS: 1.0000 | ORAL_TABLET | ORAL | Status: DC | PRN

## 2013-10-27 NOTE — Progress Notes (Unsigned)
[facility]

## 2013-10-27 NOTE — Telephone Encounter (Signed)
NOTIFIED PT. THAT HER PRESCRIPTION IS READY.  

## 2013-10-31 ENCOUNTER — Other Ambulatory Visit: Payer: Self-pay | Admitting: *Deleted

## 2013-10-31 ENCOUNTER — Other Ambulatory Visit: Payer: BC Managed Care – PPO

## 2013-10-31 ENCOUNTER — Ambulatory Visit (HOSPITAL_BASED_OUTPATIENT_CLINIC_OR_DEPARTMENT_OTHER): Payer: BC Managed Care – PPO

## 2013-10-31 ENCOUNTER — Ambulatory Visit (HOSPITAL_BASED_OUTPATIENT_CLINIC_OR_DEPARTMENT_OTHER): Payer: BC Managed Care – PPO | Admitting: Oncology

## 2013-10-31 ENCOUNTER — Encounter: Payer: Self-pay | Admitting: Oncology

## 2013-10-31 ENCOUNTER — Other Ambulatory Visit (HOSPITAL_BASED_OUTPATIENT_CLINIC_OR_DEPARTMENT_OTHER): Payer: BC Managed Care – PPO

## 2013-10-31 VITALS — BP 143/87 | HR 120 | Temp 97.9°F | Resp 18 | Ht 62.0 in | Wt 209.2 lb

## 2013-10-31 DIAGNOSIS — Z5111 Encounter for antineoplastic chemotherapy: Secondary | ICD-10-CM

## 2013-10-31 DIAGNOSIS — E876 Hypokalemia: Secondary | ICD-10-CM

## 2013-10-31 DIAGNOSIS — C50519 Malignant neoplasm of lower-outer quadrant of unspecified female breast: Secondary | ICD-10-CM

## 2013-10-31 DIAGNOSIS — C50511 Malignant neoplasm of lower-outer quadrant of right female breast: Secondary | ICD-10-CM

## 2013-10-31 DIAGNOSIS — Z170 Estrogen receptor positive status [ER+]: Secondary | ICD-10-CM

## 2013-10-31 DIAGNOSIS — Z5112 Encounter for antineoplastic immunotherapy: Secondary | ICD-10-CM

## 2013-10-31 LAB — COMPREHENSIVE METABOLIC PANEL (CC13)
ALK PHOS: 96 U/L (ref 40–150)
ALT: 39 U/L (ref 0–55)
AST: 24 U/L (ref 5–34)
Albumin: 3.8 g/dL (ref 3.5–5.0)
Anion Gap: 12 mEq/L — ABNORMAL HIGH (ref 3–11)
BILIRUBIN TOTAL: 0.38 mg/dL (ref 0.20–1.20)
BUN: 15.8 mg/dL (ref 7.0–26.0)
CO2: 18 mEq/L — ABNORMAL LOW (ref 22–29)
Calcium: 9.9 mg/dL (ref 8.4–10.4)
Chloride: 111 mEq/L — ABNORMAL HIGH (ref 98–109)
Creatinine: 0.8 mg/dL (ref 0.6–1.1)
GLUCOSE: 241 mg/dL — AB (ref 70–140)
Potassium: 4.1 mEq/L (ref 3.5–5.1)
Sodium: 140 mEq/L (ref 136–145)
Total Protein: 6.9 g/dL (ref 6.4–8.3)

## 2013-10-31 LAB — CBC WITH DIFFERENTIAL/PLATELET
BASO%: 0 % (ref 0.0–2.0)
Basophils Absolute: 0 10*3/uL (ref 0.0–0.1)
EOS%: 0 % (ref 0.0–7.0)
Eosinophils Absolute: 0 10*3/uL (ref 0.0–0.5)
HEMATOCRIT: 34.5 % — AB (ref 34.8–46.6)
HEMOGLOBIN: 12.1 g/dL (ref 11.6–15.9)
LYMPH%: 15.1 % (ref 14.0–49.7)
MCH: 31.2 pg (ref 25.1–34.0)
MCHC: 35.1 g/dL (ref 31.5–36.0)
MCV: 88.9 fL (ref 79.5–101.0)
MONO#: 0.1 10*3/uL (ref 0.1–0.9)
MONO%: 0.7 % (ref 0.0–14.0)
NEUT#: 6.4 10*3/uL (ref 1.5–6.5)
NEUT%: 84.2 % — AB (ref 38.4–76.8)
PLATELETS: 164 10*3/uL (ref 145–400)
RBC: 3.88 10*6/uL (ref 3.70–5.45)
RDW: 18 % — ABNORMAL HIGH (ref 11.2–14.5)
WBC: 7.6 10*3/uL (ref 3.9–10.3)
lymph#: 1.2 10*3/uL (ref 0.9–3.3)

## 2013-10-31 MED ORDER — ONDANSETRON 16 MG/50ML IVPB (CHCC)
INTRAVENOUS | Status: AC
  Filled 2013-10-31: qty 16

## 2013-10-31 MED ORDER — ACETAMINOPHEN 325 MG PO TABS
ORAL_TABLET | ORAL | Status: AC
  Filled 2013-10-31: qty 2

## 2013-10-31 MED ORDER — DEXAMETHASONE SODIUM PHOSPHATE 20 MG/5ML IJ SOLN
INTRAMUSCULAR | Status: AC
  Filled 2013-10-31: qty 5

## 2013-10-31 MED ORDER — DEXAMETHASONE SODIUM PHOSPHATE 20 MG/5ML IJ SOLN
20.0000 mg | Freq: Once | INTRAMUSCULAR | Status: AC
  Administered 2013-10-31: 20 mg via INTRAVENOUS

## 2013-10-31 MED ORDER — DIPHENHYDRAMINE HCL 25 MG PO CAPS
50.0000 mg | ORAL_CAPSULE | Freq: Once | ORAL | Status: AC
  Administered 2013-10-31: 50 mg via ORAL

## 2013-10-31 MED ORDER — SODIUM CHLORIDE 0.9 % IV SOLN
420.0000 mg | Freq: Once | INTRAVENOUS | Status: AC
  Administered 2013-10-31: 420 mg via INTRAVENOUS
  Filled 2013-10-31: qty 14

## 2013-10-31 MED ORDER — SODIUM CHLORIDE 0.9 % IV SOLN
900.0000 mg | Freq: Once | INTRAVENOUS | Status: AC
  Administered 2013-10-31: 900 mg via INTRAVENOUS
  Filled 2013-10-31: qty 90

## 2013-10-31 MED ORDER — DIPHENHYDRAMINE HCL 25 MG PO CAPS
ORAL_CAPSULE | ORAL | Status: AC
  Filled 2013-10-31: qty 2

## 2013-10-31 MED ORDER — SODIUM CHLORIDE 0.9 % IV SOLN
75.0000 mg/m2 | Freq: Once | INTRAVENOUS | Status: AC
  Administered 2013-10-31: 150 mg via INTRAVENOUS
  Filled 2013-10-31: qty 15

## 2013-10-31 MED ORDER — ONDANSETRON 16 MG/50ML IVPB (CHCC)
16.0000 mg | Freq: Once | INTRAVENOUS | Status: AC
  Administered 2013-10-31: 16 mg via INTRAVENOUS

## 2013-10-31 MED ORDER — SODIUM CHLORIDE 0.9 % IV SOLN
Freq: Once | INTRAVENOUS | Status: AC
  Administered 2013-10-31: 10:00:00 via INTRAVENOUS

## 2013-10-31 MED ORDER — TRASTUZUMAB CHEMO INJECTION 440 MG
6.0000 mg/kg | Freq: Once | INTRAVENOUS | Status: AC
  Administered 2013-10-31: 588 mg via INTRAVENOUS
  Filled 2013-10-31: qty 28

## 2013-10-31 MED ORDER — ACETAMINOPHEN 325 MG PO TABS
650.0000 mg | ORAL_TABLET | Freq: Once | ORAL | Status: AC
  Administered 2013-10-31: 650 mg via ORAL

## 2013-10-31 MED ORDER — HEPARIN SOD (PORK) LOCK FLUSH 100 UNIT/ML IV SOLN
500.0000 [IU] | Freq: Once | INTRAVENOUS | Status: AC | PRN
Start: 2013-10-31 — End: 2013-10-31
  Administered 2013-10-31: 500 [IU]
  Filled 2013-10-31: qty 5

## 2013-10-31 MED ORDER — SODIUM CHLORIDE 0.9 % IJ SOLN
10.0000 mL | INTRAMUSCULAR | Status: DC | PRN
  Administered 2013-10-31: 10 mL
  Filled 2013-10-31: qty 10

## 2013-10-31 NOTE — Patient Instructions (Signed)
[facility] Discharge Instructions for Patients Receiving Chemotherapy  Today you received the following chemotherapy agents herceptin/perjeta/carboplatin/taxotere.  To help prevent nausea and vomiting after your treatment, we encourage you to take your nausea medication as directed.    If you develop nausea and vomiting that is not controlled by your nausea medication, call the clinic.   BELOW ARE SYMPTOMS THAT SHOULD BE REPORTED IMMEDIATELY:  *FEVER GREATER THAN 100.5 F  *CHILLS WITH OR WITHOUT FEVER  NAUSEA AND VOMITING THAT IS NOT CONTROLLED WITH YOUR NAUSEA MEDICATION  *UNUSUAL SHORTNESS OF BREATH  *UNUSUAL BRUISING OR BLEEDING  TENDERNESS IN MOUTH AND THROAT WITH OR WITHOUT PRESENCE OF ULCERS  *URINARY PROBLEMS  *BOWEL PROBLEMS  UNUSUAL RASH Items with * indicate a potential emergency and should be followed up as soon as possible.  Feel free to call the clinic you have any questions or concerns. The clinic phone number is [phone].

## 2013-10-31 NOTE — Progress Notes (Signed)
PAC flushed when accessed, pt c/o burning.  Needle readjusted by Lavella Lemons, RN.  Blood return noted, no additional c/o burning.

## 2013-10-31 NOTE — Progress Notes (Signed)
OFFICE PROGRESS NOTE  CC**  [name],[name], MD [address] [place] [address]  DIAGNOSIS: 43 year old female with new diagnosis of triple positive invasive ductal carcinoma of the right breast diagnosed 08/16/2013  STAGE:  Breast cancer of lower-outer quadrant of right female breast  Primary site: Breast (Right)  Staging method: AJCC 7th Edition  Clinical: Stage IIIA (T3, N1, cM0) signed by Deatra Robinson, MD on 09/21/2013 6:07 PM  Summary: Stage IIIA (T3, N1, cM0)  PRIOR THERAPY: #1patient had a routine screening mammogram performed she was noted to have a large mass in the 6:00 position of the right breast measuring 3 cm for the primary mass with another 2 cm of calcifications anteriorly. She was also noted to have a enlarged lymph node in the right axilla. Patient underwent a needle core biopsy of the primary mass as well as axilla. The tumor was ER positive PR positive HER-2 positive with a proliferation marker Ki-67 59%. Patient had MRI of the breasts performed MRI showed right mass to measure 8.2 x 5.6 x 3.1 cm lymph node measured 2.5 cm on the left breast there was a 9 millimeter area of concern.  2 patient was begun on neoadjuvant chemotherapy consisting of Taxotere, carboplatin, Herceptin, and perjeta beginning on 09/19/13. Total of 6 cycles of initial therapy are planned.  CURRENT THERAPY: Neoadjuvant cycle #3 of TCH/perjeta with day 2 Neulasta  INTERVAL HISTORY: Joan Valdez 43 y.o. female returns for followup visit prior to cycle 3 of neoadjuvant chemotherapy. Overall she seems to be feeling much better. Her rash has resolved. She denies having any weakness fatigue. She has no nausea vomiting abdominal pain. She has not had any fevers chills or night sweats. She denies any breast pain nipple discharge. She has not had any bowel or bladder problems. Today she was accompanied by her mother and father. They are visiting her from [place]. Patient does have  significant dry skin. She is trying to apply Aquaphor lotion on her hands feet as well as face. She does not want to do CT treatment well due to the smell. She denies having any bleeding problems whatsoever. Remainder of the 10 point review of systems is negative.  MEDICAL HISTORY: Past Medical History  Diagnosis Date  . PCO (polycystic ovaries)   . Hypothyroid   . Allergic rhinitis   . Chronic headache   . Acid reflux   . Cervical dysplasia   . Stone, kidney     Hx: of  . Cancer     ;Breast cancer; pre-cancerous cervical cells  . 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  MEDICATIONS:  Current Outpatient Prescriptions  Medication Sig Dispense Refill  . Alum & Mag Hydroxide-Simeth (MAGIC MOUTHWASH W/LIDOCAINE) SOLN 5-88m q4hrs prn. Swish and spit, may swallow for sore throat.  480 mL  3  . cetirizine (ZYRTEC) 10 MG tablet Take 10 mg by mouth at bedtime as needed.       .Marland Kitchendexamethasone (DECADRON) 4 MG tablet Take 2 tablets (8 mg total) by mouth 2 (two) times daily with a meal. Take two times a day the day before Taxotere. Then take two times a day starting the day after chemo for 3 days.  30 tablet  1  . dexlansoprazole (DEXILANT) 60 MG capsule Take 60 mg by mouth daily.      .Marland Kitchendihydroergotamine (MIGRANAL)  4 MG/ML nasal spray Place 1 spray into the nose as needed for migraine. Use in one nostril as directed.  No more than 4 sprays in one hour      . Eflornithine HCl (VANIQA) 13.9 % cream Apply pea-sized amount as directed  30 g  5  . ibuprofen (ADVIL,MOTRIN) 200 MG tablet Take 400 mg by mouth every 8 (eight) hours as needed. For pain      . levothyroxine (SYNTHROID, LEVOTHROID) 100 MCG tablet Take 100 mcg by mouth daily.       Marland Kitchen lidocaine-prilocaine (EMLA) cream Apply topically as needed.  30 g  6  . liothyronine (CYTOMEL) 25 MCG  tablet Take 12.5 mcg by mouth 2 (two) times daily.       Marland Kitchen LORazepam (ATIVAN) 0.5 MG tablet TAKE 1 TABLET BY MOUTH EVERY 6 HOURS AS NEEDED  30 tablet  0  . mometasone (NASONEX) 50 MCG/ACT nasal spray Place 2 sprays into both nostrils daily.       . Nutritional Supplements (JUICE PLUS FIBRE PO) Take 1 tablet by mouth daily.      Marland Kitchen nystatin (MYCOSTATIN) 100000 UNIT/ML suspension Take 5 mLs (500,000 Units total) by mouth 4 (four) times daily.  60 mL  0  . ondansetron (ZOFRAN) 8 MG tablet TAKE 1 TABLET BY MOUTH TWICE A DAY STARTING THE DAY AFTER CHEMO FOR 3 DAYS, THEN AS NEEDED  30 tablet  1  . oxyCODONE-acetaminophen (PERCOCET/ROXICET) 5-325 MG per tablet Take 1-2 tablets by mouth every 4 (four) hours as needed for severe pain.  30 tablet  0  . prochlorperazine (COMPAZINE) 10 MG tablet Take 1 tablet (10 mg total) by mouth every 6 (six) hours as needed (Nausea or vomiting).  30 tablet  1  . ranitidine (ZANTAC) 300 MG tablet Take 300 mg by mouth at bedtime.      . topiramate (TOPAMAX) 100 MG tablet Take 200 mg by mouth at bedtime.       No current facility-administered medications for this visit.    SURGICAL HISTORY:  Past Surgical History  Procedure Laterality Date  . Cesarean section  2001  . Colposcopy    . Cervical biopsy  w/ loop electrode excision  2002  . Eye surgery      Hx: of metal fragment removed  . Portacath placement N/A 09/04/2013    Procedure: INSERTION PORT-A-CATH;  Surgeon: Merrie Roof, MD;  Location: [facility];  Service: General;  Laterality: N/A;    REVIEW OF SYSTEMS:  Pertinent items are noted in HPI.    PHYSICAL EXAMINATION: Blood pressure 143/87, pulse 120, temperature 97.9 F (36.6 C), temperature source Oral, resp. rate 18, height _0  (1.575 m), weight 209 lb 3.2 oz (94.892 kg). Body mass index is 38.25 kg/(m^2). ECOG PERFORMANCE STATUS: 1 - Symptomatic but completely ambulatory   General appearance: alert, cooperative and appears stated age Neck: no  adenopathy, no carotid bruit, no JVD, supple, symmetrical, trachea midline and thyroid not enlarged, symmetric, no tenderness/mass/nodules Lymph nodes: Cervical, supraclavicular, and axillary nodes normal. Resp: clear to auscultation bilaterally Back: symmetric, no curvature. ROM normal. No CVA tenderness. Cardio: regular rate and rhythm GI: soft, non-tender; bowel sounds normal; no masses,  no organomegaly Extremities: extremities normal, atraumatic, no cyanosis or edema Neurologic: Grossly normal Breasts: right breast normal with barely palpable mass, skin or nipple changes or axillary nodes, left breast normal without mass, skin or nipple changes or axillary nodes.   LABORATORY DATA: Lab Results  Component Value  Date   WBC 7.6 10/31/2013   HGB 12.1 10/31/2013   HCT 34.5* 10/31/2013   MCV 88.9 10/31/2013   PLT 164 10/31/2013      Chemistry      Component Value Date/Time   NA 139 10/17/2013 0754   NA 138 08/30/2013 0916   K 2.8* 10/17/2013 0754   K 4.0 08/30/2013 0916   CL 105 08/30/2013 0916   CO2 22 10/17/2013 0754   CO2 21 08/30/2013 0916   BUN 10.0 10/17/2013 0754   BUN 10 08/30/2013 0916   CREATININE 0.8 10/17/2013 0754   CREATININE 0.60 08/30/2013 0916      Component Value Date/Time   CALCIUM 9.3 10/17/2013 0754   CALCIUM 9.3 08/30/2013 0916   ALKPHOS 127 10/17/2013 0754   AST 18 10/17/2013 0754   ALT 25 10/17/2013 0754   BILITOT 0.57 10/17/2013 0754       RADIOGRAPHIC STUDIES:  Mm Digital Diagnostic Unilat L  09/12/2013   CLINICAL DATA:  Status post MRI guided left breast biopsy.  EXAM: DIAGNOSTIC LEFT MAMMOGRAM POST ULTRASOUND BIOPSY  COMPARISON:  Previous exams  FINDINGS: Mammographic images were obtained following MRI guided biopsy of left breast. Biopsy marking clip in appropriate position 6 o'clock left breast posterior depth.  IMPRESSION: Appropriate position left breast biopsy marking clip status post MRI guided core needle biopsy.  Final Assessment: Post Procedure Mammograms  for Marker Placement   Electronically Signed   By: Lovey Newcomer M.D.   On: 09/12/2013 11:08   Mr Aundra Millet Breast Bx Johnella Moloney Dev 1st Lesion Image Bx Spec Mr Guide  09/13/2013   ADDENDUM REPORT: 09/13/2013 12:57  ADDENDUM: I spoke with patient by telephone on September 13, 2013 to discuss pathology results. Pathology demonstrates benign fibrocystic change. Patient has no complaints at the biopsy site.  Recommendations:  Followup breast MRI in 6 months.  Treatment plan for known right breast malignancy.   Electronically Signed   By: Lovey Newcomer M.D.   On: 09/13/2013 12:57   09/13/2013   CLINICAL DATA:  Recently diagnosed right breast carcinoma and right axillary metastatic disease. Status post diagnostic MRI with non mass enhancement in the left breast.  EXAM: MRI GUIDED CORE NEEDLE BIOPSY OF THE LEFT BREAST  TECHNIQUE: Multiplanar, multisequence MR imaging of the left breast was performed both before and after administration of intravenous contrast.  CONTRAST:  58m MULTIHANCE GADOBENATE DIMEGLUMINE 529 MG/ML IV SOLN  COMPARISON:  Previous exams.  FINDINGS: I met with the patient, and we discussed the procedure of MRI guided biopsy, including risks, benefits, and alternatives. Specifically, we discussed the risks of infection, bleeding, tissue injury, clip migration, and inadequate sampling. Informed, written consent was given. The usual time out protocol was performed immediately prior to the procedure.  Using sterile technique, 2% Lidocaine, MRI guidance, and a 9 gauge vacuum assisted device, biopsy was performed of non mass enhancement within the left breast 6 o'clock position using a lateral approach. At the conclusion of the procedure, a dumbbell shaped tissue marker clip was deployed into the biopsy cavity. Follow-up 2-view mammogram was performed and dictated separately.  IMPRESSION: MRI guided biopsy of left breast non mass enhancement 6 o'clock position. No apparent complications.  Electronically Signed: By: DLovey NewcomerM.D. On: 09/12/2013 11:11    ASSESSMENT/PLAN: 43year old female with  #1 stage IIIa invasive ductal carcinoma of the right breast originally diagnosed December 2014. Tumor was ER positive PR positive HER-2 positive. She is getting neoadjuvant treatment with TCH/perjeta.  #  2 patient will proceed with cycle #3 day 1 of TCH/perjeta.. She will return tomorrow for Neulasta injection as well as IV fluids. We will give her potassium in the IV fluids if needed.  #3 hypokalemia: Patient did receive potassium in her IV fluids last week. Today's potassium levels are pending. She is encouraged to continue taking potassium rich foods. We will plan on giving her IV fluids tomorrow and if needed we'll give her potassium intravenously as well.  #4 rash: Resolved  #5 menstrual cycles: She was begun on Zoladex on 09/25/2013. We will continue this every 28 days.  #6 patient will be seen back in one weeks' time for followup, toxicity assessment and blood work.   All questions were answered. The patient knows to call the clinic with any problems, questions or concerns. We can certainly see the patient much sooner if necessary.  I spent 20 minutes counseling the patient face to face. The total time spent in the appointment was 30 minutes.    Marcy Panning, MD Medical/Oncology [facility] [phone] (beeper) [phone] (Office)  10/31/2013, 8:25 AM

## 2013-11-01 ENCOUNTER — Encounter (INDEPENDENT_AMBULATORY_CARE_PROVIDER_SITE_OTHER): Payer: BC Managed Care – PPO | Admitting: General Surgery

## 2013-11-01 ENCOUNTER — Ambulatory Visit: Payer: BC Managed Care – PPO

## 2013-11-01 ENCOUNTER — Ambulatory Visit (HOSPITAL_BASED_OUTPATIENT_CLINIC_OR_DEPARTMENT_OTHER): Payer: BC Managed Care – PPO

## 2013-11-01 VITALS — BP 116/73 | HR 110 | Temp 98.2°F | Resp 20

## 2013-11-01 DIAGNOSIS — Z5189 Encounter for other specified aftercare: Secondary | ICD-10-CM

## 2013-11-01 DIAGNOSIS — C50511 Malignant neoplasm of lower-outer quadrant of right female breast: Secondary | ICD-10-CM

## 2013-11-01 DIAGNOSIS — E876 Hypokalemia: Secondary | ICD-10-CM

## 2013-11-01 DIAGNOSIS — C773 Secondary and unspecified malignant neoplasm of axilla and upper limb lymph nodes: Secondary | ICD-10-CM

## 2013-11-01 DIAGNOSIS — C50519 Malignant neoplasm of lower-outer quadrant of unspecified female breast: Secondary | ICD-10-CM

## 2013-11-01 MED ORDER — HEPARIN SOD (PORK) LOCK FLUSH 100 UNIT/ML IV SOLN
500.0000 [IU] | Freq: Once | INTRAVENOUS | Status: AC
  Administered 2013-11-01: 500 [IU] via INTRAVENOUS
  Filled 2013-11-01: qty 5

## 2013-11-01 MED ORDER — PEGFILGRASTIM INJECTION 6 MG/0.6ML
6.0000 mg | Freq: Once | SUBCUTANEOUS | Status: AC
  Administered 2013-11-01: 6 mg via SUBCUTANEOUS
  Filled 2013-11-01: qty 0.6

## 2013-11-01 MED ORDER — SODIUM CHLORIDE 0.9 % IJ SOLN
10.0000 mL | INTRAMUSCULAR | Status: DC | PRN
  Administered 2013-11-01: 10 mL via INTRAVENOUS
  Filled 2013-11-01: qty 10

## 2013-11-01 MED ORDER — SODIUM CHLORIDE 0.9 % IV SOLN
1000.0000 mL | Freq: Once | INTRAVENOUS | Status: AC
  Administered 2013-11-01: 10:00:00 via INTRAVENOUS

## 2013-11-01 NOTE — Patient Instructions (Signed)
Dehydration, Adult Dehydration is when you lose more fluids from the body than you take in. Vital organs like the kidneys, brain, and heart cannot function without a proper amount of fluids and salt. Any loss of fluids from the body can cause dehydration.  CAUSES   Vomiting.  Diarrhea.  Excessive sweating.  Excessive urine output.  Fever. SYMPTOMS  Mild dehydration  Thirst.  Dry lips.  Slightly dry mouth. Moderate dehydration  Very dry mouth.  Sunken eyes.  Skin does not bounce back quickly when lightly pinched and released.  Dark urine and decreased urine production.  Decreased tear production.  Headache. Severe dehydration  Very dry mouth.  Extreme thirst.  Rapid, weak pulse (more than 100 beats per minute at rest).  Cold hands and feet.  Not able to sweat in spite of heat and temperature.  Rapid breathing.  Blue lips.  Confusion and lethargy.  Difficulty being awakened.  Minimal urine production.  No tears. DIAGNOSIS  Your caregiver will diagnose dehydration based on your symptoms and your exam. Blood and urine tests will help confirm the diagnosis. The diagnostic evaluation should also identify the cause of dehydration. TREATMENT  Treatment of mild or moderate dehydration can often be done at home by increasing the amount of fluids that you drink. It is best to drink small amounts of fluid more often. Drinking too much at one time can make vomiting worse. Refer to the home care instructions below. Severe dehydration needs to be treated at the hospital where you will probably be given intravenous (IV) fluids that contain water and electrolytes. HOME CARE INSTRUCTIONS   Ask your caregiver about specific rehydration instructions.  Drink enough fluids to keep your urine clear or pale yellow.  Drink small amounts frequently if you have nausea and vomiting.  Eat as you normally do.  Avoid:  Foods or drinks high in sugar.  Carbonated  drinks.  Juice.  Extremely hot or cold fluids.  Drinks with caffeine.  Fatty, greasy foods.  Alcohol.  Tobacco.  Overeating.  Gelatin desserts.  Wash your hands well to avoid spreading bacteria and viruses.  Only take over-the-counter or prescription medicines for pain, discomfort, or fever as directed by your caregiver.  Ask your caregiver if you should continue all prescribed and over-the-counter medicines.  Keep all follow-up appointments with your caregiver. SEEK MEDICAL CARE IF:  You have abdominal pain and it increases or stays in one area (localizes).  You have a rash, stiff neck, or severe headache.  You are irritable, sleepy, or difficult to awaken.  You are weak, dizzy, or extremely thirsty. SEEK IMMEDIATE MEDICAL CARE IF:   You are unable to keep fluids down or you get worse despite treatment.  You have frequent episodes of vomiting or diarrhea.  You have blood or green matter (bile) in your vomit.  You have blood in your stool or your stool looks black and tarry.  You have not urinated in 6 to 8 hours, or you have only urinated a small amount of very dark urine.  You have a fever.  You faint. MAKE SURE YOU:   Understand these instructions.  Will watch your condition.  Will get help right away if you are not doing well or get worse. Document Released: 08/31/2005 Document Revised: 11/23/2011 Document Reviewed: 04/20/2011 [employer] Patient Information 2014 [employer].  

## 2013-11-04 ENCOUNTER — Other Ambulatory Visit: Payer: Self-pay | Admitting: Oncology

## 2013-11-06 ENCOUNTER — Encounter (INDEPENDENT_AMBULATORY_CARE_PROVIDER_SITE_OTHER): Payer: Self-pay | Admitting: General Surgery

## 2013-11-06 ENCOUNTER — Ambulatory Visit (INDEPENDENT_AMBULATORY_CARE_PROVIDER_SITE_OTHER): Payer: BC Managed Care – PPO | Admitting: General Surgery

## 2013-11-06 VITALS — BP 120/82 | HR 72 | Temp 98.7°F | Resp 14 | Ht 62.0 in | Wt 200.6 lb

## 2013-11-06 DIAGNOSIS — C50511 Malignant neoplasm of lower-outer quadrant of right female breast: Secondary | ICD-10-CM

## 2013-11-06 DIAGNOSIS — C50519 Malignant neoplasm of lower-outer quadrant of unspecified female breast: Secondary | ICD-10-CM

## 2013-11-06 NOTE — Progress Notes (Signed)
Subjective:     Patient ID: Joan Valdez, female   DOB: 1971/03/03, 43 y.o.   MRN: [id number]  HPI The patient is a 43 year old white female with a locally advanced right breast cancer. Her cancer measured 4 cm in the 6:00 position of the right breast. She was ER-positive, PR-positive, and HER-2 positive. She is receiving neoadjuvant chemotherapy and is about halfway through her treatment. Although the chemotherapy makes her feel terrible she is tolerating it well. At this point she is favoring bilateral mastectomies for her definitive surgery.  Review of Systems  Constitutional: Negative.   HENT: Negative.   Eyes: Negative.   Respiratory: Negative.   Cardiovascular: Negative.   Gastrointestinal: Positive for nausea.  Endocrine: Negative.   Genitourinary: Negative.   Musculoskeletal: Negative.   Skin: Negative.   Allergic/Immunologic: Negative.   Neurological: Negative.   Hematological: Negative.   Psychiatric/Behavioral: Negative.        Objective:   Physical Exam  Constitutional: She is oriented to person, place, and time. She appears well-developed and well-nourished.  HENT:  Head: Normocephalic and atraumatic.  Eyes: Conjunctivae and EOM are normal. Pupils are equal, round, and reactive to light.  Neck: Normal range of motion. Neck supple.  Cardiovascular: Normal rate, regular rhythm and normal heart sounds.   Pulmonary/Chest: Effort normal and breath sounds normal.  There is no palpable mass in either breast. There is no palpable axillary, supraclavicular, or cervical lymphadenopathy  Abdominal: Soft. Bowel sounds are normal.  Musculoskeletal: Normal range of motion.  Lymphadenopathy:    She has no cervical adenopathy.  Neurological: She is alert and oriented to person, place, and time.  Skin: Skin is warm and dry.  Psychiatric: She has a normal mood and affect. Her behavior is normal.       Assessment:     The patient has a locally advanced right breast cancer  that is responding very well to neoadjuvant chemotherapy. The tumor is no longer palpable. Although I believe she will be getting postmastectomy radiation therapy to the chest wall and axilla she would like to talk to plastic surgery about the options for reconstruction. I will plan to see her back in another month to check her progress.     Plan:     Plan for referral to plastic surgery in followup in about one month

## 2013-11-06 NOTE — Patient Instructions (Signed)
Will refer to plastic surgery.

## 2013-11-07 ENCOUNTER — Other Ambulatory Visit (HOSPITAL_BASED_OUTPATIENT_CLINIC_OR_DEPARTMENT_OTHER): Payer: BC Managed Care – PPO

## 2013-11-07 ENCOUNTER — Ambulatory Visit (HOSPITAL_BASED_OUTPATIENT_CLINIC_OR_DEPARTMENT_OTHER): Payer: BC Managed Care – PPO

## 2013-11-07 ENCOUNTER — Ambulatory Visit (HOSPITAL_BASED_OUTPATIENT_CLINIC_OR_DEPARTMENT_OTHER): Payer: BC Managed Care – PPO | Admitting: Oncology

## 2013-11-07 ENCOUNTER — Encounter: Payer: Self-pay | Admitting: Oncology

## 2013-11-07 VITALS — BP 132/83 | HR 130 | Temp 98.0°F | Resp 18 | Ht 62.0 in | Wt 202.0 lb

## 2013-11-07 DIAGNOSIS — C50519 Malignant neoplasm of lower-outer quadrant of unspecified female breast: Secondary | ICD-10-CM

## 2013-11-07 DIAGNOSIS — E876 Hypokalemia: Secondary | ICD-10-CM

## 2013-11-07 DIAGNOSIS — C50511 Malignant neoplasm of lower-outer quadrant of right female breast: Secondary | ICD-10-CM

## 2013-11-07 DIAGNOSIS — R112 Nausea with vomiting, unspecified: Secondary | ICD-10-CM

## 2013-11-07 DIAGNOSIS — C773 Secondary and unspecified malignant neoplasm of axilla and upper limb lymph nodes: Secondary | ICD-10-CM

## 2013-11-07 DIAGNOSIS — E860 Dehydration: Secondary | ICD-10-CM

## 2013-11-07 LAB — CBC WITH DIFFERENTIAL/PLATELET
BASO%: 0.3 % (ref 0.0–2.0)
Basophils Absolute: 0 10*3/uL (ref 0.0–0.1)
EOS%: 0 % (ref 0.0–7.0)
Eosinophils Absolute: 0 10*3/uL (ref 0.0–0.5)
HCT: 37.5 % (ref 34.8–46.6)
HEMOGLOBIN: 12.8 g/dL (ref 11.6–15.9)
LYMPH#: 4.9 10*3/uL — AB (ref 0.9–3.3)
LYMPH%: 31.2 % (ref 14.0–49.7)
MCH: 31.5 pg (ref 25.1–34.0)
MCHC: 34 g/dL (ref 31.5–36.0)
MCV: 92.5 fL (ref 79.5–101.0)
MONO#: 2 10*3/uL — ABNORMAL HIGH (ref 0.1–0.9)
MONO%: 12.9 % (ref 0.0–14.0)
NEUT#: 8.7 10*3/uL — ABNORMAL HIGH (ref 1.5–6.5)
NEUT%: 55.6 % (ref 38.4–76.8)
Platelets: 173 10*3/uL (ref 145–400)
RBC: 4.05 10*6/uL (ref 3.70–5.45)
RDW: 19.8 % — AB (ref 11.2–14.5)
WBC: 15.6 10*3/uL — ABNORMAL HIGH (ref 3.9–10.3)

## 2013-11-07 LAB — COMPREHENSIVE METABOLIC PANEL (CC13)
ALBUMIN: 3.9 g/dL (ref 3.5–5.0)
ALK PHOS: 121 U/L (ref 40–150)
ALT: 39 U/L (ref 0–55)
AST: 26 U/L (ref 5–34)
Anion Gap: 13 mEq/L — ABNORMAL HIGH (ref 3–11)
BUN: 14 mg/dL (ref 7.0–26.0)
CALCIUM: 9.5 mg/dL (ref 8.4–10.4)
CO2: 25 mEq/L (ref 22–29)
Chloride: 102 mEq/L (ref 98–109)
Creatinine: 0.8 mg/dL (ref 0.6–1.1)
Glucose: 136 mg/dl (ref 70–140)
POTASSIUM: 3 meq/L — AB (ref 3.5–5.1)
Sodium: 140 mEq/L (ref 136–145)
Total Bilirubin: 0.46 mg/dL (ref 0.20–1.20)
Total Protein: 6.7 g/dL (ref 6.4–8.3)

## 2013-11-07 MED ORDER — ONDANSETRON 16 MG/50ML IVPB (CHCC)
16.0000 mg | Freq: Once | INTRAVENOUS | Status: AC
  Administered 2013-11-07: 16 mg via INTRAVENOUS

## 2013-11-07 MED ORDER — SODIUM CHLORIDE 0.9 % IV SOLN
Freq: Once | INTRAVENOUS | Status: AC
  Administered 2013-11-07: 11:00:00 via INTRAVENOUS
  Filled 2013-11-07: qty 1000

## 2013-11-07 MED ORDER — ONDANSETRON 16 MG/50ML IVPB (CHCC)
INTRAVENOUS | Status: AC
  Filled 2013-11-07: qty 16

## 2013-11-07 MED ORDER — POTASSIUM CHLORIDE CRYS ER 20 MEQ PO TBCR: 20.0000 meq | EXTENDED_RELEASE_TABLET | Freq: Every day | ORAL | Status: DC

## 2013-11-07 NOTE — Progress Notes (Signed)
OFFICE PROGRESS NOTE  CC**  Joan TOM, MD [address] [place] [address]  DIAGNOSIS: 43 year old female with new diagnosis of triple positive invasive ductal carcinoma of the right breast diagnosed 08/16/2013  STAGE:  Breast cancer of lower-outer quadrant of right female breast  Primary site: Breast (Right)  Staging method: AJCC 7th Edition  Clinical: Stage IIIA (T3, N1, cM0) signed by Deatra Robinson, MD on 09/21/2013 6:07 PM  Summary: Stage IIIA (T3, N1, cM0)  PRIOR THERAPY: #1patient had a routine screening mammogram performed she was noted to have a large mass in the 6:00 position of the right breast measuring 3 cm for the primary mass with another 2 cm of calcifications anteriorly. She was also noted to have a enlarged lymph node in the right axilla. Patient underwent a needle core biopsy of the primary mass as well as axilla. The tumor was ER positive PR positive HER-2 positive with a proliferation marker Ki-67 59%. Patient had MRI of the breasts performed MRI showed right mass to measure 8.2 x 5.6 x 3.1 cm lymph node measured 2.5 cm on the left breast there was a 9 millimeter area of concern.  2 patient was begun on neoadjuvant chemotherapy consisting of Taxotere, carboplatin, Herceptin, and perjeta beginning on 09/19/13. Total of 6 cycles of initial therapy are planned.  CURRENT THERAPY:  S/p Neoadjuvant cycle #3 of TCH/perjeta with day 2 Neulasta given on 10/31/13  INTERVAL HISTORY: Joan Valdez 43 y.o. female returns for followup visit after cycle 3 of neoadjuvant chemotherapy. She is very tired weak and fatigued. She is also having diarrhea and nausea. This is most likely due to her chemotherapy. She also was found to be hypokalemic on her blood work with a potassium of 3.0. We will give her IV fluids and replace her potassium as well as give her antiemetics. She's denying having any fevers chills or night sweats. She does feel hot today. She has no bleeding  problems no skin rashes. Remainder of the 10 point review of systems is negative.  MEDICAL HISTORY: Past Medical History  Diagnosis Date  . PCO (polycystic ovaries)   . Hypothyroid   . Allergic rhinitis   . Chronic headache   . Acid reflux   . Cervical dysplasia   . Stone, kidney     Hx: of  . Cancer     ;Breast cancer; pre-cancerous cervical cells  . 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  MEDICATIONS:  Current Outpatient Prescriptions  Medication Sig Dispense Refill  . dexlansoprazole (DEXILANT) 60 MG capsule Take 60 mg by mouth daily.      . Eflornithine HCl (VANIQA) 13.9 % cream Apply pea-sized amount as directed  30 g  5  . levothyroxine (SYNTHROID, LEVOTHROID) 100 MCG tablet Take 100 mcg by mouth daily.       Marland Kitchen lidocaine-prilocaine (EMLA) cream Apply topically as needed.  30 g  6  . liothyronine (CYTOMEL) 25 MCG tablet Take 12.5 mcg by mouth 2 (two) times daily.       Marland Kitchen LORazepam (ATIVAN) 0.5 MG tablet TAKE 1 TABLET BY MOUTH EVERY 6 HOURS AS NEEDED  30 tablet  0  . Nutritional Supplements (JUICE PLUS FIBRE PO) Take 1 tablet by mouth daily.      . ondansetron (ZOFRAN) 8 MG tablet TAKE 1 TABLET BY MOUTH TWICE A DAY STARTING  THE DAY AFTER CHEMO FOR 3 DAYS, THEN AS NEEDED  30 tablet  1  . oxyCODONE-acetaminophen (PERCOCET/ROXICET) 5-325 MG per tablet Take 1-2 tablets by mouth every 4 (four) hours as needed for severe pain.  30 tablet  0  . prochlorperazine (COMPAZINE) 10 MG tablet Take 1 tablet (10 mg total) by mouth every 6 (six) hours as needed (Nausea or vomiting).  30 tablet  1  . ranitidine (ZANTAC) 300 MG tablet Take 300 mg by mouth at bedtime.      . topiramate (TOPAMAX) 100 MG tablet Take 200 mg by mouth at bedtime.      . Alum & Mag Hydroxide-Simeth (MAGIC MOUTHWASH W/LIDOCAINE) SOLN 5-78ml q4hrs prn. Swish and  spit, may swallow for sore throat.  480 mL  3  . cetirizine (ZYRTEC) 10 MG tablet Take 10 mg by mouth at bedtime as needed.       Marland Kitchen dexamethasone (DECADRON) 4 MG tablet Take 2 tablets (8 mg total) by mouth 2 (two) times daily with a meal. Take two times a day the day before Taxotere. Then take two times a day starting the day after chemo for 3 days.  30 tablet  1  . dihydroergotamine (MIGRANAL) 4 MG/ML nasal spray Place 1 spray into the nose as needed for migraine. Use in one nostril as directed.  No more than 4 sprays in one hour      . ibuprofen (ADVIL,MOTRIN) 200 MG tablet Take 400 mg by mouth every 8 (eight) hours as needed. For pain      . mometasone (NASONEX) 50 MCG/ACT nasal spray Place 2 sprays into both nostrils daily.       Marland Kitchen nystatin (MYCOSTATIN) 100000 UNIT/ML suspension Take 5 mLs (500,000 Units total) by mouth 4 (four) times daily.  60 mL  0   No current facility-administered medications for this visit.    SURGICAL HISTORY:  Past Surgical History  Procedure Laterality Date  . Cesarean section  2001  . Colposcopy    . Cervical biopsy  w/ loop electrode excision  2002  . Eye surgery      Hx: of metal fragment removed  . Portacath placement N/A 09/04/2013    Procedure: INSERTION PORT-A-CATH;  Surgeon: Merrie Roof, MD;  Location: [facility];  Service: General;  Laterality: N/A;    REVIEW OF SYSTEMS:  Pertinent items are noted in HPI.    PHYSICAL EXAMINATION: Blood pressure 132/83, pulse 130, temperature 98 F (36.7 C), temperature source Oral, resp. rate 18, height $RemoveBe'5\' 2"'AKMfAoUFD$  (1.575 m), weight 202 lb (91.627 kg). Body mass index is 36.94 kg/(m^2). ECOG PERFORMANCE STATUS: 1 - Symptomatic but completely ambulatory   General appearance: alert, cooperative and appears stated age Neck: no adenopathy, no carotid bruit, no JVD, supple, symmetrical, trachea midline and thyroid not enlarged, symmetric, no tenderness/mass/nodules Lymph nodes: Cervical, supraclavicular, and axillary nodes  normal. Resp: clear to auscultation bilaterally Back: symmetric, no curvature. ROM normal. No CVA tenderness. Cardio: regular rate and rhythm GI: soft, non-tender; bowel sounds normal; no masses,  no organomegaly Extremities: extremities normal, atraumatic, no cyanosis or edema Neurologic: Grossly normal Breasts: right breast normal with barely palpable mass, skin or nipple changes or axillary nodes, left breast normal without mass, skin or nipple changes or axillary nodes.   LABORATORY DATA: Lab Results  Component Value Date   WBC 15.6* 11/07/2013   HGB 12.8 11/07/2013   HCT 37.5 11/07/2013   MCV 92.5 11/07/2013   PLT 173 11/07/2013  Chemistry      Component Value Date/Time   NA 140 11/07/2013 0905   NA 138 08/30/2013 0916   K 3.0* 11/07/2013 0905   K 4.0 08/30/2013 0916   CL 105 08/30/2013 0916   CO2 25 11/07/2013 0905   CO2 21 08/30/2013 0916   BUN 14.0 11/07/2013 0905   BUN 10 08/30/2013 0916   CREATININE 0.8 11/07/2013 0905   CREATININE 0.60 08/30/2013 0916      Component Value Date/Time   CALCIUM 9.5 11/07/2013 0905   CALCIUM 9.3 08/30/2013 0916   ALKPHOS 121 11/07/2013 0905   AST 26 11/07/2013 0905   ALT 39 11/07/2013 0905   BILITOT 0.46 11/07/2013 0905       RADIOGRAPHIC STUDIES:  Mm Digital Diagnostic Unilat L  09/12/2013   CLINICAL DATA:  Status post MRI guided left breast biopsy.  EXAM: DIAGNOSTIC LEFT MAMMOGRAM POST ULTRASOUND BIOPSY  COMPARISON:  Previous exams  FINDINGS: Mammographic images were obtained following MRI guided biopsy of left breast. Biopsy marking clip in appropriate position 6 o'clock left breast posterior depth.  IMPRESSION: Appropriate position left breast biopsy marking clip status post MRI guided core needle biopsy.  Final Assessment: Post Procedure Mammograms for Marker Placement   Electronically Signed   By: Lovey Newcomer M.D.   On: 09/12/2013 11:08   Mr Aundra Millet Breast Bx Johnella Moloney Dev 1st Lesion Image Bx Spec Mr Guide  09/13/2013   ADDENDUM REPORT:  09/13/2013 12:57  ADDENDUM: I spoke with patient by telephone on September 13, 2013 to discuss pathology results. Pathology demonstrates benign fibrocystic change. Patient has no complaints at the biopsy site.  Recommendations:  Followup breast MRI in 6 months.  Treatment plan for known right breast malignancy.   Electronically Signed   By: Lovey Newcomer M.D.   On: 09/13/2013 12:57   09/13/2013   CLINICAL DATA:  Recently diagnosed right breast carcinoma and right axillary metastatic disease. Status post diagnostic MRI with non mass enhancement in the left breast.  EXAM: MRI GUIDED CORE NEEDLE BIOPSY OF THE LEFT BREAST  TECHNIQUE: Multiplanar, multisequence MR imaging of the left breast was performed both before and after administration of intravenous contrast.  CONTRAST:  67mL MULTIHANCE GADOBENATE DIMEGLUMINE 529 MG/ML IV SOLN  COMPARISON:  Previous exams.  FINDINGS: I met with the patient, and we discussed the procedure of MRI guided biopsy, including risks, benefits, and alternatives. Specifically, we discussed the risks of infection, bleeding, tissue injury, clip migration, and inadequate sampling. Informed, written consent was given. The usual time out protocol was performed immediately prior to the procedure.  Using sterile technique, 2% Lidocaine, MRI guidance, and a 9 gauge vacuum assisted device, biopsy was performed of non mass enhancement within the left breast 6 o'clock position using a lateral approach. At the conclusion of the procedure, a dumbbell shaped tissue marker clip was deployed into the biopsy cavity. Follow-up 2-view mammogram was performed and dictated separately.  IMPRESSION: MRI guided biopsy of left breast non mass enhancement 6 o'clock position. No apparent complications.  Electronically Signed: By: Lovey Newcomer M.D. On: 09/12/2013 11:11    ASSESSMENT/PLAN: 43 year old female with  #1 stage IIIa invasive ductal carcinoma of the right breast originally diagnosed December 2014. Tumor  was ER positive PR positive HER-2 positive. She is getting neoadjuvant treatment with TCH/perjeta.  #2 s/p cycle #3 day 1 of TCH/perjeta.Marland Kitchen overall she tolerated it well.  #3 FEN: Patient has hypokalemia again and is dehydrated. She is also nauseous. I will give her  IV fluids today along with potassium in the IV fluids as well as Zofran. #4 menstrual cycles: She was begun on Zoladex on 09/25/2013. We will continue this every 28 days.  #5 patient will be seen back on 11/21/2013 for cycle 4 of TCH/P. with day 2 Neulasta.  All questions were answered. The patient knows to call the clinic with any problems, questions or concerns. We can certainly see the patient much sooner if necessary.  I spent 20 minutes counseling the patient face to face. The total time spent in the appointment was 30 minutes.    Marcy Panning, MD Medical/Oncology [facility] [phone] (beeper) [phone] (Office)  11/07/2013, 10:02 AM

## 2013-11-07 NOTE — Patient Instructions (Signed)
Dehydration, Adult  Dehydration means your body does not have as much fluid as it needs. Your kidneys, brain, and heart will not work properly without the right amount of fluids and salt.   HOME CARE   Ask your doctor how to replace body fluid losses (rehydrate).   Drink enough fluids to keep your pee (urine) clear or pale yellow.   Drink small amounts of fluids often if you feel sick to your stomach (nauseous) or throw up (vomit).   Eat like you normally do.   Avoid:   Foods or drinks high in sugar.   Bubbly (carbonated) drinks.   Juice.   Very hot or cold fluids.   Drinks with caffeine.   Fatty, greasy foods.   Alcohol.   Tobacco.   Eating too much.   Gelatin desserts.   Wash your hands to avoid spreading germs (bacteria, viruses).   Only take medicine as told by your doctor.   Keep all doctor visits as told.  GET HELP RIGHT AWAY IF:    You cannot drink something without throwing up.   You get worse even with treatment.   Your vomit has blood in it or looks greenish.   Your poop (stool) has blood in it or looks black and tarry.   You have not peed in 6 to 8 hours.   You pee a small amount of very dark pee.   You have a fever.   You pass out (faint).   You have belly (abdominal) pain that gets worse or stays in one spot (localizes).   You have a rash, stiff neck, or bad headache.   You get easily annoyed, sleepy, or are hard to wake up.   You feel weak, dizzy, or very thirsty.  MAKE SURE YOU:    Understand these instructions.   Will watch your condition.   Will get help right away if you are not doing well or get worse.  Document Released: 06/27/2009 Document Revised: 11/23/2011 Document Reviewed: 04/20/2011  [employer] Patient Information 2014 [employer].

## 2013-11-07 NOTE — Patient Instructions (Signed)
Proceed with IV fluids, Zofran, and potassium.  Take foods rich in potassium.  I have sent a prescription for potassium chloride. You can take 1 tablet on a daily basis to help prevent low potassium counts.

## 2013-11-13 ENCOUNTER — Telehealth (INDEPENDENT_AMBULATORY_CARE_PROVIDER_SITE_OTHER): Payer: Self-pay | Admitting: *Deleted

## 2013-11-13 NOTE — Telephone Encounter (Signed)
I spoke with pt and informed her of the appt for her to see Dr. Harlow Mares on 11/15/13 with an arrival time of 10:45am.  I informed her of his office location.  She is agreeable with this appt.

## 2013-11-16 ENCOUNTER — Ambulatory Visit (HOSPITAL_BASED_OUTPATIENT_CLINIC_OR_DEPARTMENT_OTHER): Payer: BC Managed Care – PPO | Admitting: Genetic Counselor

## 2013-11-16 ENCOUNTER — Other Ambulatory Visit: Payer: BC Managed Care – PPO

## 2013-11-16 ENCOUNTER — Encounter: Payer: Self-pay | Admitting: Genetic Counselor

## 2013-11-16 DIAGNOSIS — C50519 Malignant neoplasm of lower-outer quadrant of unspecified female breast: Secondary | ICD-10-CM

## 2013-11-16 DIAGNOSIS — C50511 Malignant neoplasm of lower-outer quadrant of right female breast: Secondary | ICD-10-CM

## 2013-11-16 DIAGNOSIS — Z170 Estrogen receptor positive status [ER+]: Secondary | ICD-10-CM

## 2013-11-16 NOTE — Progress Notes (Signed)
Dr.  Marcy Panning requested a consultation for genetic counseling and risk assessment for Joan Valdez, a 43 y.o. female, for discussion of her personal history of breast cancer.  She presents to clinic today to discuss the possibility of a genetic predisposition to cancer, and to further clarify her risks, as well as her family members' risks for cancer.   HISTORY OF PRESENT ILLNESS: In December 2014, at the age of 21, Joan Valdez was diagnosed with invasive ductal carcinoma of the right breast. This was treated with chemotherapy, and surgery and radiation is planned.  The tumor is triple positive.   Past Medical History  Diagnosis Date  . PCO (polycystic ovaries)   . Hypothyroid   . Allergic rhinitis   . Chronic headache   . Acid reflux   . Cervical dysplasia   . Stone, kidney     Hx: of  . Cancer     ;Breast cancer; pre-cancerous cervical cells  . Breast cancer 08/16/13    invasive ductal ca,DCIS,     Past Surgical History  Procedure Laterality Date  . Cesarean section  2001  . Colposcopy    . Cervical biopsy  w/ loop electrode excision  2002  . Eye surgery      Hx: of metal fragment removed  . Portacath placement N/A 09/04/2013    Procedure: INSERTION PORT-A-CATH;  Surgeon: Merrie Roof, MD;  Location: [facility];  Service: General;  Laterality: N/A;    History   Social History  . Marital Status: Married    Spouse Name: N/A    Number of Children: 1  . Years of Education: N/A   Occupational History  . Pharmacist, hospital   .  [place] History Main Topics  . Smoking status: Never Smoker   . Smokeless tobacco: Never Used  . Alcohol Use: 1.0 oz/week    2 drink(s) per week     Comment: socially.  . Drug Use: No  . Sexual Activity: Yes    Birth Control/ Protection: Pill   Other Topics Concern  . None   Social History Narrative  . None    REPRODUCTIVE HISTORY AND PERSONAL RISK ASSESSMENT FACTORS: Menarche was at age 52.    premenopausal Uterus Intact: yes Ovaries Intact: yes G1P1A0, first live birth at age 85  She has previously undergone treatment for infertility.   Oral Contraceptive use: 19 years   She has not used HRT in the past.    FAMILY HISTORY:  We obtained a detailed, 4-generation family history.  Significant diagnoses are listed below: Family History  Problem Relation Age of Onset  . Hypertension Mother   . Migraines Mother   . Diabetes Paternal Grandmother   . Hypertension Paternal Grandmother   . Rheum arthritis Paternal Grandmother   . Heart disease Maternal Grandmother   . Aortic aneurysm Paternal Grandfather   . Psychiatric Illness Cousin   . Epilepsy Other 9    niece    Patient's maternal ancestors are of [place] descent, and paternal ancestors are of [place] descent. There is no reported Ashkenazi Jewish ancestry. There is no known consanguinity.  GENETIC COUNSELING ASSESSMENT: Joan Valdez is a 43 y.o. female with a personal history of breast cancer which somewhat suggestive of a hereditary cancer syndrome and predisposition to cancer. We, therefore, discussed and recommended the following at today's visit.   DISCUSSION: We reviewed the characteristics, features and inheritance patterns of hereditary cancer syndromes. We also discussed genetic testing, including  the appropriate family members to test, the process of testing, insurance coverage and turn-around-time for results.   In order to estimate her chance of having a BRCA mutation, we used statistical models (Myriad risk calculator and PENN II) and laboratory data that take into account her personal medical history, family history and ancestry.  Because each model is different, there can be a lot of variability in the risks they give.  Therefore, these numbers must be considered a rough range and not a precise risk of having a BRCA mutation.  These models estimate that she has approximately a 4.7-11% chance of having a mutation.  Based on this assessment of her family and personal history, genetic testing is recommended.  PLAN: After considering the risks, benefits, and limitations, Joan Valdez provided informed consent to pursue genetic testing and the blood sample will be sent to [facility] for analysis of the Breast/Ovarian Cancer Panel. We discussed the implications of a positive, negative and/ or variant of uncertain significance genetic test result. Results should be available within approximately 3 weeks' time, at which point they will be disclosed by telephone to Joan Valdez, as will any additional recommendations warranted by these results. Joan Valdez will receive a summary of her genetic counseling visit and a copy of her results once available. This information will also be available in Epic. We encouraged Joan Valdez to remain in contact with cancer genetics annually so that we can continuously update the family history and inform her of any changes in cancer genetics and testing that may be of benefit for her family. Joan Valdez's questions were answered to her satisfaction today. Our contact information was provided should additional questions or concerns arise.  The patient was seen for a total of 30 minutes, greater than 50% of which was spent face-to-face counseling.  This note will also be sent to the referring provider via the electronic medical record. The patient will be supplied with a summary of this genetic counseling discussion as well as educational information on the discussed hereditary cancer syndromes following the conclusion of their visit.   Patient was discussed with Dr. Marcy Panning.   _______________________________________________________________________ For Office Staff:  Number of people involved in session: 1 Was an Intern/ student involved with case: no

## 2013-11-18 ENCOUNTER — Other Ambulatory Visit: Payer: Self-pay | Admitting: Oncology

## 2013-11-21 ENCOUNTER — Other Ambulatory Visit (HOSPITAL_BASED_OUTPATIENT_CLINIC_OR_DEPARTMENT_OTHER): Payer: BC Managed Care – PPO

## 2013-11-21 ENCOUNTER — Other Ambulatory Visit: Payer: Self-pay | Admitting: *Deleted

## 2013-11-21 ENCOUNTER — Encounter: Payer: Self-pay | Admitting: Oncology

## 2013-11-21 ENCOUNTER — Ambulatory Visit: Payer: BC Managed Care – PPO

## 2013-11-21 ENCOUNTER — Ambulatory Visit (HOSPITAL_BASED_OUTPATIENT_CLINIC_OR_DEPARTMENT_OTHER): Payer: BC Managed Care – PPO | Admitting: Oncology

## 2013-11-21 ENCOUNTER — Telehealth: Payer: Self-pay | Admitting: Oncology

## 2013-11-21 ENCOUNTER — Telehealth: Payer: Self-pay | Admitting: *Deleted

## 2013-11-21 VITALS — BP 134/85 | HR 118 | Temp 98.3°F | Resp 20 | Ht 62.0 in | Wt 209.0 lb

## 2013-11-21 DIAGNOSIS — Z170 Estrogen receptor positive status [ER+]: Secondary | ICD-10-CM

## 2013-11-21 DIAGNOSIS — C50519 Malignant neoplasm of lower-outer quadrant of unspecified female breast: Secondary | ICD-10-CM

## 2013-11-21 DIAGNOSIS — R5383 Other fatigue: Secondary | ICD-10-CM

## 2013-11-21 DIAGNOSIS — C50511 Malignant neoplasm of lower-outer quadrant of right female breast: Secondary | ICD-10-CM

## 2013-11-21 DIAGNOSIS — R5381 Other malaise: Secondary | ICD-10-CM

## 2013-11-21 DIAGNOSIS — D6959 Other secondary thrombocytopenia: Secondary | ICD-10-CM

## 2013-11-21 LAB — COMPREHENSIVE METABOLIC PANEL (CC13)
ALBUMIN: 3.4 g/dL — AB (ref 3.5–5.0)
ALT: 24 U/L (ref 0–55)
AST: 16 U/L (ref 5–34)
Alkaline Phosphatase: 71 U/L (ref 40–150)
Anion Gap: 11 mEq/L (ref 3–11)
BUN: 14.6 mg/dL (ref 7.0–26.0)
CALCIUM: 9.3 mg/dL (ref 8.4–10.4)
CHLORIDE: 114 meq/L — AB (ref 98–109)
CO2: 17 mEq/L — ABNORMAL LOW (ref 22–29)
Creatinine: 0.7 mg/dL (ref 0.6–1.1)
Glucose: 171 mg/dl — ABNORMAL HIGH (ref 70–140)
POTASSIUM: 3.7 meq/L (ref 3.5–5.1)
Sodium: 142 mEq/L (ref 136–145)
TOTAL PROTEIN: 6.3 g/dL — AB (ref 6.4–8.3)
Total Bilirubin: 0.37 mg/dL (ref 0.20–1.20)

## 2013-11-21 LAB — CBC WITH DIFFERENTIAL/PLATELET
BASO%: 0.1 % (ref 0.0–2.0)
Basophils Absolute: 0 10*3/uL (ref 0.0–0.1)
EOS%: 0 % (ref 0.0–7.0)
Eosinophils Absolute: 0 10*3/uL (ref 0.0–0.5)
HCT: 28.2 % — ABNORMAL LOW (ref 34.8–46.6)
HGB: 9.8 g/dL — ABNORMAL LOW (ref 11.6–15.9)
LYMPH#: 1.4 10*3/uL (ref 0.9–3.3)
LYMPH%: 15.9 % (ref 14.0–49.7)
MCH: 34.2 pg — AB (ref 25.1–34.0)
MCHC: 34.7 g/dL (ref 31.5–36.0)
MCV: 98.5 fL (ref 79.5–101.0)
MONO#: 0.4 10*3/uL (ref 0.1–0.9)
MONO%: 4.3 % (ref 0.0–14.0)
NEUT#: 7.2 10*3/uL — ABNORMAL HIGH (ref 1.5–6.5)
NEUT%: 79.7 % — ABNORMAL HIGH (ref 38.4–76.8)
Platelets: 71 10*3/uL — ABNORMAL LOW (ref 145–400)
RBC: 2.86 10*6/uL — ABNORMAL LOW (ref 3.70–5.45)
RDW: 25 % — AB (ref 11.2–14.5)
WBC: 9.1 10*3/uL (ref 3.9–10.3)

## 2013-11-21 MED ORDER — ZOLPIDEM TARTRATE 10 MG PO TABS: 10.0000 mg | ORAL_TABLET | Freq: Every evening | ORAL | Status: DC | PRN

## 2013-11-21 MED ORDER — OXYCODONE-ACETAMINOPHEN 5-325 MG PO TABS: 1.0000 | ORAL_TABLET | ORAL | Status: DC | PRN

## 2013-11-21 NOTE — Telephone Encounter (Signed)
Per staff message and POF I have scheduled appts.  JMW  

## 2013-11-21 NOTE — Telephone Encounter (Signed)
, °

## 2013-11-21 NOTE — Progress Notes (Signed)
OFFICE PROGRESS NOTE  CC**  [name],[name], MD [address] [place] [address]  DIAGNOSIS: 43 year old female with new diagnosis of triple positive invasive ductal carcinoma of the right breast diagnosed 08/16/2013  STAGE:  Breast cancer of lower-outer quadrant of right female breast  Primary site: Breast (Right)  Staging method: AJCC 7th Edition  Clinical: Stage IIIA (T3, N1, cM0) signed by Deatra Robinson, MD on 09/21/2013 6:07 PM  Summary: Stage IIIA (T3, N1, cM0)  PRIOR THERAPY: #1patient had a routine screening mammogram performed she was noted to have a large mass in the 6:00 position of the right breast measuring 3 cm for the primary mass with another 2 cm of calcifications anteriorly. She was also noted to have a enlarged lymph node in the right axilla. Patient underwent a needle core biopsy of the primary mass as well as axilla. The tumor was ER positive PR positive HER-2 positive with a proliferation marker Ki-67 59%. Patient had MRI of the breasts performed MRI showed right mass to measure 8.2 x 5.6 x 3.1 cm lymph node measured 2.5 cm on the left breast there was a 9 millimeter area of concern.  2 patient was begun on neoadjuvant chemotherapy consisting of Taxotere, carboplatin, Herceptin, and perjeta beginning on 09/19/13. Total of 6 cycles of initial therapy are planned.  CURRENT THERAPY:  S/p Neoadjuvant cycle #3 of TCH/perjeta with day 2 Neulasta given on 10/31/13  INTERVAL HISTORY: Belita Warsame 43 y.o. female returns for followup visit . She is very tired weak and fatigued.her blood counts are low she will not receive chemotherapy today. She is tired and fatigued,no bruising or bleeding, no n/v/d. Today she denies any headaches double vision blurring of vision fevers chills night sweats. No shortness of breath chest pains palpitations. No abdominal pain no diarrhea or constipation. She has no easy bruising or bleeding. She has no myalgias and arthralgias. No  peripheral paresthesias or gait disturbances. Remainder of the 10 point review of systems is negative.  MEDICAL HISTORY: Past Medical History  Diagnosis Date  . PCO (polycystic ovaries)   . Hypothyroid   . Allergic rhinitis   . Chronic headache   . Acid reflux   . Cervical dysplasia   . Stone, kidney     Hx: of  . Cancer     ;Breast cancer; pre-cancerous cervical cells  . 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  MEDICATIONS:  Current Outpatient Prescriptions  Medication Sig Dispense Refill  . Alum & Mag Hydroxide-Simeth (MAGIC MOUTHWASH W/LIDOCAINE) SOLN 5-68m q4hrs prn. Swish and spit, may swallow for sore throat.  480 mL  3  . cetirizine (ZYRTEC) 10 MG tablet Take 10 mg by mouth at bedtime as needed.       .Marland Kitchendexamethasone (DECADRON) 4 MG tablet TAKE 2 TABLETS BY MOUTH TWICE A DAY WITH A MEAL STARTING A DAY BEFORE TAXOTERE AS DIRECTED  30 tablet  1  . dexlansoprazole (DEXILANT) 60 MG capsule Take 60 mg by mouth daily.      .Marland Kitchendihydroergotamine (MIGRANAL) 4 MG/ML nasal spray Place 1 spray into the nose as needed for migraine. Use in one nostril as directed.  No more than 4 sprays in one hour      . Eflornithine HCl (VANIQA) 13.9 % cream Apply pea-sized amount as directed  30 g  5  . ibuprofen (  ADVIL,MOTRIN) 200 MG tablet Take 400 mg by mouth every 8 (eight) hours as needed. For pain      . levothyroxine (SYNTHROID, LEVOTHROID) 100 MCG tablet Take 100 mcg by mouth daily.       Marland Kitchen lidocaine-prilocaine (EMLA) cream Apply topically as needed.  30 g  6  . liothyronine (CYTOMEL) 25 MCG tablet Take 12.5 mcg by mouth 2 (two) times daily.       Marland Kitchen LORazepam (ATIVAN) 0.5 MG tablet TAKE 1 TABLET BY MOUTH EVERY 6 HOURS AS NEEDED  30 tablet  0  . mometasone (NASONEX) 50 MCG/ACT nasal spray Place 2 sprays into both nostrils daily.       .  Nutritional Supplements (JUICE PLUS FIBRE PO) Take 1 tablet by mouth daily.      . ondansetron (ZOFRAN) 8 MG tablet TAKE 1 TABLET BY MOUTH TWICE A DAY STARTING THE DAY AFTER CHEMO FOR 3 DAYS, THEN AS NEEDED  30 tablet  1  . prochlorperazine (COMPAZINE) 10 MG tablet TAKE 1 TABLET BY MOUTH EVERY 6 HOURS AS NEEDED FOR NAUSEA OR VOMITING  30 tablet  1  . ranitidine (ZANTAC) 300 MG tablet Take 300 mg by mouth at bedtime.      . topiramate (TOPAMAX) 100 MG tablet Take 200 mg by mouth at bedtime.      Marland Kitchen oxyCODONE-acetaminophen (PERCOCET/ROXICET) 5-325 MG per tablet Take 1-2 tablets by mouth every 4 (four) hours as needed for severe pain.  30 tablet  0   No current facility-administered medications for this visit.    SURGICAL HISTORY:  Past Surgical History  Procedure Laterality Date  . Cesarean section  2001  . Colposcopy    . Cervical biopsy  w/ loop electrode excision  2002  . Eye surgery      Hx: of metal fragment removed  . Portacath placement N/A 09/04/2013    Procedure: INSERTION PORT-A-CATH;  Surgeon: Merrie Roof, MD;  Location: [facility];  Service: General;  Laterality: N/A;    REVIEW OF SYSTEMS:  Pertinent items are noted in HPI.    PHYSICAL EXAMINATION: Blood pressure 134/85, pulse 118, temperature 98.3 F (36.8 C), temperature source Oral, resp. rate 20, height _0  (1.575 m), weight 209 lb (94.802 kg). Body mass index is 38.22 kg/(m^2). ECOG PERFORMANCE STATUS: 1 - Symptomatic but completely ambulatory   General appearance: alert, cooperative and appears stated age Neck: no adenopathy, no carotid bruit, no JVD, supple, symmetrical, trachea midline and thyroid not enlarged, symmetric, no tenderness/mass/nodules Lymph nodes: Cervical, supraclavicular, and axillary nodes normal. Resp: clear to auscultation bilaterally Back: symmetric, no curvature. ROM normal. No CVA tenderness. Cardio: regular rate and rhythm GI: soft, non-tender; bowel sounds normal; no masses,  no  organomegaly Extremities: extremities normal, atraumatic, no cyanosis or edema Neurologic: Grossly normal Breasts: right breast normal with barely palpable mass, skin or nipple changes or axillary nodes, left breast normal without mass, skin or nipple changes or axillary nodes.   LABORATORY DATA: Lab Results  Component Value Date   WBC 9.1 11/21/2013   HGB 9.8* 11/21/2013   HCT 28.2* 11/21/2013   MCV 98.5 11/21/2013   PLT 71* 11/21/2013      Chemistry      Component Value Date/Time   NA 142 11/21/2013 0900   NA 138 08/30/2013 0916   K 3.7 11/21/2013 0900   K 4.0 08/30/2013 0916   CL 105 08/30/2013 0916   CO2 17* 11/21/2013 0900   CO2 21 08/30/2013 0916  BUN 14.6 11/21/2013 0900   BUN 10 08/30/2013 0916   CREATININE 0.7 11/21/2013 0900   CREATININE 0.60 08/30/2013 0916      Component Value Date/Time   CALCIUM 9.3 11/21/2013 0900   CALCIUM 9.3 08/30/2013 0916   ALKPHOS 71 11/21/2013 0900   AST 16 11/21/2013 0900   ALT 24 11/21/2013 0900   BILITOT 0.37 11/21/2013 0900       RADIOGRAPHIC STUDIES:  Mm Digital Diagnostic Unilat L  09/12/2013   CLINICAL DATA:  Status post MRI guided left breast biopsy.  EXAM: DIAGNOSTIC LEFT MAMMOGRAM POST ULTRASOUND BIOPSY  COMPARISON:  Previous exams  FINDINGS: Mammographic images were obtained following MRI guided biopsy of left breast. Biopsy marking clip in appropriate position 6 o'clock left breast posterior depth.  IMPRESSION: Appropriate position left breast biopsy marking clip status post MRI guided core needle biopsy.  Final Assessment: Post Procedure Mammograms for Marker Placement   Electronically Signed   By: Lovey Newcomer M.D.   On: 09/12/2013 11:08   Mr Aundra Millet Breast Bx Johnella Moloney Dev 1st Lesion Image Bx Spec Mr Guide  09/13/2013   ADDENDUM REPORT: 09/13/2013 12:57  ADDENDUM: I spoke with patient by telephone on September 13, 2013 to discuss pathology results. Pathology demonstrates benign fibrocystic change. Patient has no complaints at the biopsy  site.  Recommendations:  Followup breast MRI in 6 months.  Treatment plan for known right breast malignancy.   Electronically Signed   By: Lovey Newcomer M.D.   On: 09/13/2013 12:57   09/13/2013   CLINICAL DATA:  Recently diagnosed right breast carcinoma and right axillary metastatic disease. Status post diagnostic MRI with non mass enhancement in the left breast.  EXAM: MRI GUIDED CORE NEEDLE BIOPSY OF THE LEFT BREAST  TECHNIQUE: Multiplanar, multisequence MR imaging of the left breast was performed both before and after administration of intravenous contrast.  CONTRAST:  73m MULTIHANCE GADOBENATE DIMEGLUMINE 529 MG/ML IV SOLN  COMPARISON:  Previous exams.  FINDINGS: I met with the patient, and we discussed the procedure of MRI guided biopsy, including risks, benefits, and alternatives. Specifically, we discussed the risks of infection, bleeding, tissue injury, clip migration, and inadequate sampling. Informed, written consent was given. The usual time out protocol was performed immediately prior to the procedure.  Using sterile technique, 2% Lidocaine, MRI guidance, and a 9 gauge vacuum assisted device, biopsy was performed of non mass enhancement within the left breast 6 o'clock position using a lateral approach. At the conclusion of the procedure, a dumbbell shaped tissue marker clip was deployed into the biopsy cavity. Follow-up 2-view mammogram was performed and dictated separately.  IMPRESSION: MRI guided biopsy of left breast non mass enhancement 6 o'clock position. No apparent complications.  Electronically Signed: By: DLovey NewcomerM.D. On: 09/12/2013 11:11    ASSESSMENT/PLAN: 43year old female with  #1 stage IIIa invasive ductal carcinoma of the right breast originally diagnosed December 2014. Tumor was ER positive PR positive HER-2 positive. She is getting neoadjuvant treatment with TCH/perjeta.  #2 patient here for cycle #4 day 1 of TCH/perjeta. But blood counts are low and will not be receiving  it today. We will delay by 1 week.   #3 FEN: overall doing well, no fluids today. She may require IVF with next chemotherapy  #4 menstrual cycles: She was begun on Zoladex on 09/25/2013. We will continue this every 28 days. Her next injection will be on 3/17 on the day of her chemotherapy  #5 Thrombocytopenia: will monitor for now ,  this due to chemotherapy  #5 patient will be seen back on 11/28/2013 for cycle 4 of TCH/P. with day 2 Neulasta.  All questions were answered. The patient knows to call the clinic with any problems, questions or concerns. We can certainly see the patient much sooner if necessary.  I spent 20 minutes counseling the patient face to face. The total time spent in the appointment was 30 minutes.    Marcy Panning, MD Medical/Oncology [facility] [phone] (beeper) [phone] (Office)  11/21/2013, 11:00 AM

## 2013-11-22 ENCOUNTER — Ambulatory Visit: Payer: BC Managed Care – PPO

## 2013-11-23 ENCOUNTER — Ambulatory Visit: Payer: BC Managed Care – PPO

## 2013-11-28 ENCOUNTER — Ambulatory Visit: Payer: BC Managed Care – PPO

## 2013-11-28 ENCOUNTER — Telehealth: Payer: Self-pay | Admitting: Oncology

## 2013-11-28 ENCOUNTER — Ambulatory Visit (HOSPITAL_BASED_OUTPATIENT_CLINIC_OR_DEPARTMENT_OTHER): Payer: BC Managed Care – PPO | Admitting: Oncology

## 2013-11-28 ENCOUNTER — Encounter: Payer: Self-pay | Admitting: Oncology

## 2013-11-28 ENCOUNTER — Telehealth: Payer: Self-pay | Admitting: *Deleted

## 2013-11-28 ENCOUNTER — Other Ambulatory Visit (HOSPITAL_BASED_OUTPATIENT_CLINIC_OR_DEPARTMENT_OTHER): Payer: BC Managed Care – PPO

## 2013-11-28 ENCOUNTER — Ambulatory Visit (HOSPITAL_BASED_OUTPATIENT_CLINIC_OR_DEPARTMENT_OTHER): Payer: BC Managed Care – PPO

## 2013-11-28 VITALS — BP 144/88 | HR 112 | Temp 97.9°F | Resp 18 | Ht 62.0 in | Wt 205.3 lb

## 2013-11-28 DIAGNOSIS — Z170 Estrogen receptor positive status [ER+]: Secondary | ICD-10-CM

## 2013-11-28 DIAGNOSIS — C50519 Malignant neoplasm of lower-outer quadrant of unspecified female breast: Secondary | ICD-10-CM

## 2013-11-28 DIAGNOSIS — Z5112 Encounter for antineoplastic immunotherapy: Secondary | ICD-10-CM

## 2013-11-28 DIAGNOSIS — C50511 Malignant neoplasm of lower-outer quadrant of right female breast: Secondary | ICD-10-CM

## 2013-11-28 DIAGNOSIS — Z5111 Encounter for antineoplastic chemotherapy: Secondary | ICD-10-CM

## 2013-11-28 LAB — CBC WITH DIFFERENTIAL/PLATELET
BASO%: 0.1 % (ref 0.0–2.0)
BASOS ABS: 0 10*3/uL (ref 0.0–0.1)
EOS%: 0 % (ref 0.0–7.0)
Eosinophils Absolute: 0 10*3/uL (ref 0.0–0.5)
HCT: 32.3 % — ABNORMAL LOW (ref 34.8–46.6)
HEMOGLOBIN: 11.3 g/dL — AB (ref 11.6–15.9)
LYMPH%: 13 % — ABNORMAL LOW (ref 14.0–49.7)
MCH: 35.2 pg — AB (ref 25.1–34.0)
MCHC: 35.1 g/dL (ref 31.5–36.0)
MCV: 100.2 fL (ref 79.5–101.0)
MONO#: 0.3 10*3/uL (ref 0.1–0.9)
MONO%: 2 % (ref 0.0–14.0)
NEUT#: 10.9 10*3/uL — ABNORMAL HIGH (ref 1.5–6.5)
NEUT%: 84.9 % — ABNORMAL HIGH (ref 38.4–76.8)
Platelets: 194 10*3/uL (ref 145–400)
RBC: 3.22 10*6/uL — AB (ref 3.70–5.45)
RDW: 22.2 % — ABNORMAL HIGH (ref 11.2–14.5)
WBC: 12.8 10*3/uL — ABNORMAL HIGH (ref 3.9–10.3)
lymph#: 1.7 10*3/uL (ref 0.9–3.3)

## 2013-11-28 LAB — COMPREHENSIVE METABOLIC PANEL (CC13)
ALT: 53 U/L (ref 0–55)
ANION GAP: 12 meq/L — AB (ref 3–11)
AST: 17 U/L (ref 5–34)
Albumin: 3.7 g/dL (ref 3.5–5.0)
Alkaline Phosphatase: 88 U/L (ref 40–150)
BUN: 12.6 mg/dL (ref 7.0–26.0)
CALCIUM: 9.7 mg/dL (ref 8.4–10.4)
CO2: 18 meq/L — AB (ref 22–29)
CREATININE: 0.7 mg/dL (ref 0.6–1.1)
Chloride: 111 mEq/L — ABNORMAL HIGH (ref 98–109)
Glucose: 213 mg/dl — ABNORMAL HIGH (ref 70–140)
Potassium: 4 mEq/L (ref 3.5–5.1)
Sodium: 141 mEq/L (ref 136–145)
Total Bilirubin: 0.31 mg/dL (ref 0.20–1.20)
Total Protein: 6.9 g/dL (ref 6.4–8.3)

## 2013-11-28 MED ORDER — SODIUM CHLORIDE 0.9 % IV SOLN
Freq: Once | INTRAVENOUS | Status: AC
  Administered 2013-11-28: 09:00:00 via INTRAVENOUS

## 2013-11-28 MED ORDER — ONDANSETRON 16 MG/50ML IVPB (CHCC)
16.0000 mg | Freq: Once | INTRAVENOUS | Status: AC
  Administered 2013-11-28: 16 mg via INTRAVENOUS

## 2013-11-28 MED ORDER — SODIUM CHLORIDE 0.9 % IV SOLN
900.0000 mg | Freq: Once | INTRAVENOUS | Status: AC
  Administered 2013-11-28: 900 mg via INTRAVENOUS
  Filled 2013-11-28: qty 90

## 2013-11-28 MED ORDER — DEXAMETHASONE SODIUM PHOSPHATE 20 MG/5ML IJ SOLN
INTRAMUSCULAR | Status: AC
  Filled 2013-11-28: qty 5

## 2013-11-28 MED ORDER — TRASTUZUMAB CHEMO INJECTION 440 MG
6.0000 mg/kg | Freq: Once | INTRAVENOUS | Status: AC
  Administered 2013-11-28: 588 mg via INTRAVENOUS
  Filled 2013-11-28: qty 28

## 2013-11-28 MED ORDER — ACETAMINOPHEN 325 MG PO TABS
650.0000 mg | ORAL_TABLET | Freq: Once | ORAL | Status: AC
  Administered 2013-11-28: 650 mg via ORAL

## 2013-11-28 MED ORDER — SODIUM CHLORIDE 0.9 % IJ SOLN
10.0000 mL | INTRAMUSCULAR | Status: DC | PRN
  Administered 2013-11-28: 10 mL
  Filled 2013-11-28: qty 10

## 2013-11-28 MED ORDER — DIPHENHYDRAMINE HCL 25 MG PO CAPS
50.0000 mg | ORAL_CAPSULE | Freq: Once | ORAL | Status: AC
  Administered 2013-11-28: 50 mg via ORAL

## 2013-11-28 MED ORDER — DEXAMETHASONE SODIUM PHOSPHATE 20 MG/5ML IJ SOLN
20.0000 mg | Freq: Once | INTRAMUSCULAR | Status: AC
  Administered 2013-11-28: 20 mg via INTRAVENOUS

## 2013-11-28 MED ORDER — SODIUM CHLORIDE 0.9 % IV SOLN
420.0000 mg | Freq: Once | INTRAVENOUS | Status: AC
  Administered 2013-11-28: 420 mg via INTRAVENOUS
  Filled 2013-11-28: qty 14

## 2013-11-28 MED ORDER — ACETAMINOPHEN 325 MG PO TABS
ORAL_TABLET | ORAL | Status: AC
  Filled 2013-11-28: qty 2

## 2013-11-28 MED ORDER — ONDANSETRON 16 MG/50ML IVPB (CHCC)
INTRAVENOUS | Status: AC
  Filled 2013-11-28: qty 16

## 2013-11-28 MED ORDER — GOSERELIN ACETATE 3.6 MG ~~LOC~~ IMPL
3.6000 mg | DRUG_IMPLANT | Freq: Once | SUBCUTANEOUS | Status: AC
  Administered 2013-11-28: 3.6 mg via SUBCUTANEOUS
  Filled 2013-11-28: qty 3.6

## 2013-11-28 MED ORDER — HEPARIN SOD (PORK) LOCK FLUSH 100 UNIT/ML IV SOLN
500.0000 [IU] | Freq: Once | INTRAVENOUS | Status: AC | PRN
  Administered 2013-11-28: 500 [IU]
  Filled 2013-11-28: qty 5

## 2013-11-28 MED ORDER — SODIUM CHLORIDE 0.9 % IV SOLN
75.0000 mg/m2 | Freq: Once | INTRAVENOUS | Status: AC
  Administered 2013-11-28: 150 mg via INTRAVENOUS
  Filled 2013-11-28: qty 15

## 2013-11-28 NOTE — Progress Notes (Signed)
Enrolled pt in the Neulasta First Step program.  I faxed signed form and activated card today.  °

## 2013-11-28 NOTE — Telephone Encounter (Signed)
Per staff message and POF I have scheduled appts.  JMW  

## 2013-11-28 NOTE — Progress Notes (Signed)
1230-Carboplatin ran over 45 minutes d/t increased volume.

## 2013-11-28 NOTE — Progress Notes (Signed)
OFFICE PROGRESS NOTE  CC**  [name],[name], MD [address] [place] [address]  DIAGNOSIS: 43 year old female with new diagnosis of triple positive invasive ductal carcinoma of the right breast diagnosed 08/16/2013  STAGE:  Breast cancer of lower-outer quadrant of right female breast  Primary site: Breast (Right)  Staging method: AJCC 7th Edition  Clinical: Stage IIIA (T3, N1, cM0) signed by Deatra Robinson, MD on 09/21/2013 6:07 PM  Summary: Stage IIIA (T3, N1, cM0)  PRIOR THERAPY: #1patient had a routine screening mammogram performed she was noted to have a large mass in the 6:00 position of the right breast measuring 3 cm for the primary mass with another 2 cm of calcifications anteriorly. She was also noted to have a enlarged lymph node in the right axilla. Patient underwent a needle core biopsy of the primary mass as well as axilla. The tumor was ER positive PR positive HER-2 positive with a proliferation marker Ki-67 59%. Patient had MRI of the breasts performed MRI showed right mass to measure 8.2 x 5.6 x 3.1 cm lymph node measured 2.5 cm on the left breast there was a 9 millimeter area of concern.  2 patient was begun on neoadjuvant chemotherapy consisting of Taxotere, carboplatin, Herceptin, and perjeta beginning on 09/19/13. Total of 6 cycles of initial therapy are planned.  CURRENT THERAPY:   Neoadjuvant cycle #4 of TCH/perjeta with day 2 Neulasta   INTERVAL HISTORY: Joan Valdez 43 y.o. female returns for followup visit . Overall patient feels much better. She is a little bit hypertensive and tachycardic but is asymptomatic from this. This may all be due to anxiety. Her platelet count has recovered completely. Her hemoglobin is up as well. Patient feels well rested. She denies having any tingling or numbness in her hands or feet no headaches double vision blurring of vision fevers chills or night sweats no shortness of breath chest pains or palpitations no easy  bruising or bleeding. Remainder of the 10 point review of systems is negative.  MEDICAL HISTORY: Past Medical History  Diagnosis Date  . PCO (polycystic ovaries)   . Hypothyroid   . Allergic rhinitis   . Chronic headache   . Acid reflux   . Cervical dysplasia   . Stone, kidney     Hx: of  . Cancer     ;Breast cancer; pre-cancerous cervical cells  . 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  MEDICATIONS:  Current Outpatient Prescriptions  Medication Sig Dispense Refill  . Alum & Mag Hydroxide-Simeth (MAGIC MOUTHWASH W/LIDOCAINE) SOLN 5-56ml q4hrs prn. Swish and spit, may swallow for sore throat.  480 mL  3  . cetirizine (ZYRTEC) 10 MG tablet Take 10 mg by mouth at bedtime as needed.       Marland Kitchen dexamethasone (DECADRON) 4 MG tablet TAKE 2 TABLETS BY MOUTH TWICE A DAY WITH A MEAL STARTING A DAY BEFORE TAXOTERE AS DIRECTED  30 tablet  1  . dexlansoprazole (DEXILANT) 60 MG capsule Take 60 mg by mouth daily.      Marland Kitchen dihydroergotamine (MIGRANAL) 4 MG/ML nasal spray Place 1 spray into the nose as needed for migraine. Use in one nostril as directed.  No more than 4 sprays in one hour      . Eflornithine HCl (VANIQA) 13.9 % cream Apply pea-sized amount as directed  30 g  5  .  ibuprofen (ADVIL,MOTRIN) 200 MG tablet Take 400 mg by mouth every 8 (eight) hours as needed. For pain      . levothyroxine (SYNTHROID, LEVOTHROID) 100 MCG tablet Take 100 mcg by mouth daily.       Marland Kitchen lidocaine-prilocaine (EMLA) cream Apply topically as needed.  30 g  6  . liothyronine (CYTOMEL) 25 MCG tablet Take 12.5 mcg by mouth 2 (two) times daily.       Marland Kitchen LORazepam (ATIVAN) 0.5 MG tablet TAKE 1 TABLET BY MOUTH EVERY 6 HOURS AS NEEDED  30 tablet  0  . mometasone (NASONEX) 50 MCG/ACT nasal spray Place 2 sprays into both nostrils daily.       . Nutritional Supplements  (JUICE PLUS FIBRE PO) Take 1 tablet by mouth daily.      . ondansetron (ZOFRAN) 8 MG tablet TAKE 1 TABLET BY MOUTH TWICE A DAY STARTING THE DAY AFTER CHEMO FOR 3 DAYS, THEN AS NEEDED  30 tablet  1  . oxyCODONE-acetaminophen (PERCOCET/ROXICET) 5-325 MG per tablet Take 1-2 tablets by mouth every 4 (four) hours as needed for severe pain.  30 tablet  0  . prochlorperazine (COMPAZINE) 10 MG tablet TAKE 1 TABLET BY MOUTH EVERY 6 HOURS AS NEEDED FOR NAUSEA OR VOMITING  30 tablet  1  . ranitidine (ZANTAC) 300 MG tablet Take 300 mg by mouth at bedtime.      . topiramate (TOPAMAX) 100 MG tablet Take 200 mg by mouth at bedtime.      Marland Kitchen zolpidem (AMBIEN) 10 MG tablet Take 1 tablet (10 mg total) by mouth at bedtime as needed for sleep.  30 tablet  0   No current facility-administered medications for this visit.    SURGICAL HISTORY:  Past Surgical History  Procedure Laterality Date  . Cesarean section  2001  . Colposcopy    . Cervical biopsy  w/ loop electrode excision  2002  . Eye surgery      Hx: of metal fragment removed  . Portacath placement N/A 09/04/2013    Procedure: INSERTION PORT-A-CATH;  Surgeon: Merrie Roof, MD;  Location: [place];  Service: General;  Laterality: N/A;    REVIEW OF SYSTEMS:  Pertinent items are noted in HPI.    PHYSICAL EXAMINATION: Blood pressure 144/88, pulse 112, temperature 97.9 F (36.6 C), temperature source Oral, resp. rate 18, height $RemoveBe'5\' 2"'hMkaNgZgG$  (1.575 m), weight 205 lb 4.8 oz (93.123 kg). Body mass index is 37.54 kg/(m^2). ECOG PERFORMANCE STATUS: 1 - Symptomatic but completely ambulatory   General appearance: alert, cooperative and appears stated age Neck: no adenopathy, no carotid bruit, no JVD, supple, symmetrical, trachea midline and thyroid not enlarged, symmetric, no tenderness/mass/nodules Lymph nodes: Cervical, supraclavicular, and axillary nodes normal. Resp: clear to auscultation bilaterally Back: symmetric, no curvature. ROM normal. No CVA  tenderness. Cardio: regular rate and rhythm GI: soft, non-tender; bowel sounds normal; no masses,  no organomegaly Extremities: extremities normal, atraumatic, no cyanosis or edema Neurologic: Grossly normal Breasts: right breast normal with barely palpable mass, skin or nipple changes or axillary nodes, left breast normal without mass, skin or nipple changes or axillary nodes.   LABORATORY DATA: Lab Results  Component Value Date   WBC 12.8* 11/28/2013   HGB 11.3* 11/28/2013   HCT 32.3* 11/28/2013   MCV 100.2 11/28/2013   PLT 194 11/28/2013      Chemistry      Component Value Date/Time   NA 142 11/21/2013 0900   NA 138 08/30/2013 0916  K 3.7 11/21/2013 0900   K 4.0 08/30/2013 0916   CL 105 08/30/2013 0916   CO2 17* 11/21/2013 0900   CO2 21 08/30/2013 0916   BUN 14.6 11/21/2013 0900   BUN 10 08/30/2013 0916   CREATININE 0.7 11/21/2013 0900   CREATININE 0.60 08/30/2013 0916      Component Value Date/Time   CALCIUM 9.3 11/21/2013 0900   CALCIUM 9.3 08/30/2013 0916   ALKPHOS 71 11/21/2013 0900   AST 16 11/21/2013 0900   ALT 24 11/21/2013 0900   BILITOT 0.37 11/21/2013 0900       RADIOGRAPHIC STUDIES:  Mm Digital Diagnostic Unilat L  09/12/2013   CLINICAL DATA:  Status post MRI guided left breast biopsy.  EXAM: DIAGNOSTIC LEFT MAMMOGRAM POST ULTRASOUND BIOPSY  COMPARISON:  Previous exams  FINDINGS: Mammographic images were obtained following MRI guided biopsy of left breast. Biopsy marking clip in appropriate position 6 o'clock left breast posterior depth.  IMPRESSION: Appropriate position left breast biopsy marking clip status post MRI guided core needle biopsy.  Final Assessment: Post Procedure Mammograms for Marker Placement   Electronically Signed   By: Lovey Newcomer M.D.   On: 09/12/2013 11:08   Mr Aundra Millet Breast Bx Johnella Moloney Dev 1st Lesion Image Bx Spec Mr Guide  09/13/2013   ADDENDUM REPORT: 09/13/2013 12:57  ADDENDUM: I spoke with patient by telephone on September 13, 2013 to discuss  pathology results. Pathology demonstrates benign fibrocystic change. Patient has no complaints at the biopsy site.  Recommendations:  Followup breast MRI in 6 months.  Treatment plan for known right breast malignancy.   Electronically Signed   By: Lovey Newcomer M.D.   On: 09/13/2013 12:57   09/13/2013   CLINICAL DATA:  Recently diagnosed right breast carcinoma and right axillary metastatic disease. Status post diagnostic MRI with non mass enhancement in the left breast.  EXAM: MRI GUIDED CORE NEEDLE BIOPSY OF THE LEFT BREAST  TECHNIQUE: Multiplanar, multisequence MR imaging of the left breast was performed both before and after administration of intravenous contrast.  CONTRAST:  13mL MULTIHANCE GADOBENATE DIMEGLUMINE 529 MG/ML IV SOLN  COMPARISON:  Previous exams.  FINDINGS: I met with the patient, and we discussed the procedure of MRI guided biopsy, including risks, benefits, and alternatives. Specifically, we discussed the risks of infection, bleeding, tissue injury, clip migration, and inadequate sampling. Informed, written consent was given. The usual time out protocol was performed immediately prior to the procedure.  Using sterile technique, 2% Lidocaine, MRI guidance, and a 9 gauge vacuum assisted device, biopsy was performed of non mass enhancement within the left breast 6 o'clock position using a lateral approach. At the conclusion of the procedure, a dumbbell shaped tissue marker clip was deployed into the biopsy cavity. Follow-up 2-view mammogram was performed and dictated separately.  IMPRESSION: MRI guided biopsy of left breast non mass enhancement 6 o'clock position. No apparent complications.  Electronically Signed: By: Lovey Newcomer M.D. On: 09/12/2013 11:11    ASSESSMENT/PLAN: 43 year old female with  #1 stage IIIa invasive ductal carcinoma of the right breast originally diagnosed December 2014. Tumor was ER positive PR positive HER-2 positive. She is getting neoadjuvant treatment with  TCH/perjeta.  #2 patient here for cycle #4 day 1 of TCH/perjeta. Her blood counts have recovered she will proceed with today's treatment.  #3 FEN: Patient will receive IV fluids when she returns in one week's time.  #4 menstrual cycles: She was begun on Zoladex on 09/25/2013. We will continue this every 28 days.  Her next injection will be on 3/17 on the day of her chemotherapy  #5 Thrombocytopenia: Improved her platelets are now normal  #6 patient will be set up for MRI of the breasts to evaluate response to therapy.  #7 she will also be referred to cardiology again for followup as well as an echocardiogram to evaluate her heart function with ongoing antiestrogen therapy.  #8 she has a followup set up with Dr. Marlou Starks on April 6.  #9 patient will be seen back in one week for followup labs IV fluids and chemotherapy toxicity check.  All questions were answered. The patient knows to call the clinic with any problems, questions or concerns. We can certainly see the patient much sooner if necessary.  I spent 20 minutes counseling the patient face to face. The total time spent in the appointment was 30 minutes.    Marcy Panning, MD Medical/Oncology [facility] [phone] (beeper) [phone] (Office)  11/28/2013, 8:31 AM

## 2013-11-28 NOTE — Patient Instructions (Addendum)
[place] Discharge Instructions for Patients Receiving Chemotherapy  Today you received the following chemotherapy agents:  Herceptin, Perjeta, Taxotere and Carboplatin  To help prevent nausea and vomiting after your treatment, we encourage you to take your nausea medication as ordered per MD.   If you develop nausea and vomiting that is not controlled by your nausea medication, call the clinic.   BELOW ARE SYMPTOMS THAT SHOULD BE REPORTED IMMEDIATELY:  *FEVER GREATER THAN 100.5 F  *CHILLS WITH OR WITHOUT FEVER  NAUSEA AND VOMITING THAT IS NOT CONTROLLED WITH YOUR NAUSEA MEDICATION  *UNUSUAL SHORTNESS OF BREATH  *UNUSUAL BRUISING OR BLEEDING  TENDERNESS IN MOUTH AND THROAT WITH OR WITHOUT PRESENCE OF ULCERS  *URINARY PROBLEMS  *BOWEL PROBLEMS  UNUSUAL RASH Items with * indicate a potential emergency and should be followed up as soon as possible.  Feel free to call the clinic you have any questions or concerns. The clinic phone number is [phone].   Goserelin injection What is this medicine? GOSERELIN (GOE se rel in) is similar to a hormone found in the body. It lowers the amount of sex hormones that the body makes. Men will have lower testosterone levels and women will have lower estrogen levels while taking this medicine. In men, this medicine is used to treat prostate cancer; the injection is either given once per month or once every 12 weeks. A once per month injection (only) is used to treat women with endometriosis, dysfunctional uterine bleeding, or advanced breast cancer. This medicine may be used for other purposes; ask your health care provider or pharmacist if you have questions. COMMON BRAND NAME(S): Zoladex What should I tell my health care provider before I take this medicine? They need to know if you have any of these conditions (some only apply to women): -diabetes -heart disease or previous heart attack -high blood pressure -high  cholesterol -kidney disease -osteoporosis or low bone density -problems passing urine -spinal cord injury -stroke -tobacco smoker -an unusual or allergic reaction to goserelin, hormone therapy, other medicines, foods, dyes, or preservatives -pregnant or trying to get pregnant -breast-feeding How should I use this medicine? This medicine is for injection under the skin. It is given by a health care professional in a hospital or clinic setting. Men receive this injection once every 4 weeks or once every 12 weeks. Women will only receive the once every 4 weeks injection. Talk to your pediatrician regarding the use of this medicine in children. Special care may be needed. Overdosage: If you think you have taken too much of this medicine contact a poison control center or emergency room at once. NOTE: This medicine is only for you. Do not share this medicine with others. What if I miss a dose? It is important not to miss your dose. Call your doctor or health care professional if you are unable to keep an appointment. What may interact with this medicine? -female hormones like estrogen -herbal or dietary supplements like black cohosh, chasteberry, or DHEA -female hormones like testosterone -prasterone This list may not describe all possible interactions. Give your health care provider a list of all the medicines, herbs, non-prescription drugs, or dietary supplements you use. Also tell them if you smoke, drink alcohol, or use illegal drugs. Some items may interact with your medicine. What should I watch for while using this medicine? Visit your doctor or health care professional for regular checks on your progress. Your symptoms may appear to get worse during the first weeks of  this therapy. Tell your doctor or healthcare professional if your symptoms do not start to get better or if they get worse after this time. Your bones may get weaker if you take this medicine for a long time. If you smoke or  frequently drink alcohol you may increase your risk of bone loss. A family history of osteoporosis, chronic use of drugs for seizures (convulsions), or corticosteroids can also increase your risk of bone loss. Talk to your doctor about how to keep your bones strong. This medicine should stop regular monthly menstration in women. Tell your doctor if you continue to [facility]. Women should not become pregnant while taking this medicine or for 12 weeks after stopping this medicine. Women should inform their doctor if they wish to become pregnant or think they might be pregnant. There is a potential for serious side effects to an unborn child. Talk to your health care professional or pharmacist for more information. Do not breast-feed an infant while taking this medicine. Men should inform their doctors if they wish to father a child. This medicine may lower sperm counts. Talk to your health care professional or pharmacist for more information. What side effects may I notice from receiving this medicine? Side effects that you should report to your doctor or health care professional as soon as possible: -allergic reactions like skin rash, itching or hives, swelling of the face, lips, or tongue -bone pain -breathing problems -changes in vision -chest pain -feeling faint or lightheaded, falls -fever, chills -pain, swelling, warmth in the leg -pain, tingling, numbness in the hands or feet -swelling of the ankles, feet, hands -trouble passing urine or change in the amount of urine -unusually high or low blood pressure -unusually weak or tired Side effects that usually do not require medical attention (report to your doctor or health care professional if they continue or are bothersome): -change in sex drive or performance -changes in breast size in both males and females -changes in emotions or moods -headache -hot flashes -irritation at site where injected -loss of appetite -skin problems like  acne, dry skin -vaginal dryness This list may not describe all possible side effects. Call your doctor for medical advice about side effects. You may report side effects to FDA at 1-800-FDA-1088. Where should I keep my medicine? This drug is given in a hospital or clinic and will not be stored at home. NOTE: This sheet is a summary. It may not cover all possible information. If you have questions about this medicine, talk to your doctor, pharmacist, or health care provider.  2014, [employer]. (2009-01-15 13:28:29)

## 2013-11-29 ENCOUNTER — Ambulatory Visit (HOSPITAL_BASED_OUTPATIENT_CLINIC_OR_DEPARTMENT_OTHER): Payer: BC Managed Care – PPO

## 2013-11-29 ENCOUNTER — Other Ambulatory Visit: Payer: Self-pay | Admitting: Oncology

## 2013-11-29 ENCOUNTER — Encounter: Payer: Self-pay | Admitting: Family Medicine

## 2013-11-29 VITALS — BP 120/79 | HR 124 | Temp 98.6°F

## 2013-11-29 DIAGNOSIS — C50511 Malignant neoplasm of lower-outer quadrant of right female breast: Secondary | ICD-10-CM

## 2013-11-29 DIAGNOSIS — C50519 Malignant neoplasm of lower-outer quadrant of unspecified female breast: Secondary | ICD-10-CM

## 2013-11-29 DIAGNOSIS — Z5189 Encounter for other specified aftercare: Secondary | ICD-10-CM

## 2013-11-29 MED ORDER — PEGFILGRASTIM INJECTION 6 MG/0.6ML
6.0000 mg | Freq: Once | SUBCUTANEOUS | Status: AC
  Administered 2013-11-29: 6 mg via SUBCUTANEOUS
  Filled 2013-11-29: qty 0.6

## 2013-11-30 ENCOUNTER — Telehealth: Payer: Self-pay | Admitting: Oncology

## 2013-11-30 NOTE — Telephone Encounter (Signed)
, °

## 2013-12-04 ENCOUNTER — Other Ambulatory Visit: Payer: Self-pay | Admitting: Family Medicine

## 2013-12-04 NOTE — Telephone Encounter (Signed)
Medication refilled per protocol. 

## 2013-12-05 ENCOUNTER — Other Ambulatory Visit: Payer: Self-pay | Admitting: *Deleted

## 2013-12-05 ENCOUNTER — Encounter: Payer: Self-pay | Admitting: Genetic Counselor

## 2013-12-05 ENCOUNTER — Other Ambulatory Visit: Payer: Self-pay | Admitting: Family Medicine

## 2013-12-05 ENCOUNTER — Encounter: Payer: Self-pay | Admitting: Oncology

## 2013-12-05 ENCOUNTER — Ambulatory Visit (HOSPITAL_BASED_OUTPATIENT_CLINIC_OR_DEPARTMENT_OTHER): Payer: BC Managed Care – PPO

## 2013-12-05 ENCOUNTER — Ambulatory Visit (HOSPITAL_BASED_OUTPATIENT_CLINIC_OR_DEPARTMENT_OTHER): Payer: BC Managed Care – PPO | Admitting: Oncology

## 2013-12-05 ENCOUNTER — Telehealth: Payer: Self-pay | Admitting: Genetic Counselor

## 2013-12-05 ENCOUNTER — Other Ambulatory Visit (HOSPITAL_BASED_OUTPATIENT_CLINIC_OR_DEPARTMENT_OTHER): Payer: BC Managed Care – PPO

## 2013-12-05 ENCOUNTER — Ambulatory Visit (HOSPITAL_COMMUNITY)
Admission: RE | Admit: 2013-12-05 | Discharge: 2013-12-05 | Disposition: A | Discharge status: Home / Self Care | Payer: BC Managed Care – PPO | Source: Ambulatory Visit | Attending: Oncology | Admitting: Oncology

## 2013-12-05 VITALS — BP 130/83 | HR 116 | Temp 98.5°F | Resp 18 | Ht 62.0 in | Wt 203.4 lb

## 2013-12-05 DIAGNOSIS — E860 Dehydration: Secondary | ICD-10-CM

## 2013-12-05 DIAGNOSIS — C50519 Malignant neoplasm of lower-outer quadrant of unspecified female breast: Secondary | ICD-10-CM

## 2013-12-05 DIAGNOSIS — C773 Secondary and unspecified malignant neoplasm of axilla and upper limb lymph nodes: Secondary | ICD-10-CM

## 2013-12-05 DIAGNOSIS — C50511 Malignant neoplasm of lower-outer quadrant of right female breast: Secondary | ICD-10-CM

## 2013-12-05 DIAGNOSIS — E876 Hypokalemia: Secondary | ICD-10-CM

## 2013-12-05 DIAGNOSIS — D649 Anemia, unspecified: Secondary | ICD-10-CM

## 2013-12-05 DIAGNOSIS — Z170 Estrogen receptor positive status [ER+]: Secondary | ICD-10-CM

## 2013-12-05 DIAGNOSIS — I519 Heart disease, unspecified: Secondary | ICD-10-CM

## 2013-12-05 DIAGNOSIS — D696 Thrombocytopenia, unspecified: Secondary | ICD-10-CM

## 2013-12-05 LAB — CBC WITH DIFFERENTIAL/PLATELET
BASO%: 0.3 % (ref 0.0–2.0)
Basophils Absolute: 0 10*3/uL (ref 0.0–0.1)
EOS%: 0.2 % (ref 0.0–7.0)
Eosinophils Absolute: 0 10*3/uL (ref 0.0–0.5)
HEMATOCRIT: 30.8 % — AB (ref 34.8–46.6)
HGB: 10.5 g/dL — ABNORMAL LOW (ref 11.6–15.9)
LYMPH#: 2.8 10*3/uL (ref 0.9–3.3)
LYMPH%: 36 % (ref 14.0–49.7)
MCH: 34.3 pg — AB (ref 25.1–34.0)
MCHC: 34.2 g/dL (ref 31.5–36.0)
MCV: 100.3 fL (ref 79.5–101.0)
MONO#: 0.7 10*3/uL (ref 0.1–0.9)
MONO%: 8.7 % (ref 0.0–14.0)
NEUT#: 4.3 10*3/uL (ref 1.5–6.5)
NEUT%: 54.8 % (ref 38.4–76.8)
Platelets: 99 10*3/uL — ABNORMAL LOW (ref 145–400)
RBC: 3.07 10*6/uL — ABNORMAL LOW (ref 3.70–5.45)
RDW: 18.3 % — AB (ref 11.2–14.5)
WBC: 7.9 10*3/uL (ref 3.9–10.3)

## 2013-12-05 LAB — COMPREHENSIVE METABOLIC PANEL (CC13)
ALT: 31 U/L (ref 0–55)
AST: 22 U/L (ref 5–34)
Albumin: 3.7 g/dL (ref 3.5–5.0)
Alkaline Phosphatase: 108 U/L (ref 40–150)
Anion Gap: 10 mEq/L (ref 3–11)
BUN: 7.9 mg/dL (ref 7.0–26.0)
CALCIUM: 9.2 mg/dL (ref 8.4–10.4)
CHLORIDE: 108 meq/L (ref 98–109)
CO2: 24 mEq/L (ref 22–29)
CREATININE: 0.7 mg/dL (ref 0.6–1.1)
Glucose: 108 mg/dl (ref 70–140)
Potassium: 2.8 mEq/L — CL (ref 3.5–5.1)
Sodium: 142 mEq/L (ref 136–145)
TOTAL PROTEIN: 6.3 g/dL — AB (ref 6.4–8.3)
Total Bilirubin: 0.46 mg/dL (ref 0.20–1.20)

## 2013-12-05 MED ORDER — HEPARIN SOD (PORK) LOCK FLUSH 100 UNIT/ML IV SOLN
500.0000 [IU] | Freq: Once | INTRAVENOUS | Status: AC
  Administered 2013-12-05: 500 [IU] via INTRAVENOUS
  Filled 2013-12-05: qty 5

## 2013-12-05 MED ORDER — SODIUM CHLORIDE 0.9 % IV SOLN
Freq: Once | INTRAVENOUS | Status: AC
  Administered 2013-12-05: 15:00:00 via INTRAVENOUS

## 2013-12-05 MED ORDER — SODIUM CHLORIDE 0.9 % IV SOLN
Freq: Once | INTRAVENOUS | Status: AC
  Administered 2013-12-05: 15:00:00 via INTRAVENOUS
  Filled 2013-12-05: qty 1000

## 2013-12-05 MED ORDER — POTASSIUM CHLORIDE CRYS ER 20 MEQ PO TBCR: 20.0000 meq | EXTENDED_RELEASE_TABLET | Freq: Every day | ORAL | Status: DC

## 2013-12-05 MED ORDER — SODIUM CHLORIDE 0.9 % IJ SOLN
10.0000 mL | INTRAMUSCULAR | Status: DC | PRN
  Administered 2013-12-05: 10 mL via INTRAVENOUS
  Filled 2013-12-05: qty 10

## 2013-12-05 NOTE — Telephone Encounter (Signed)
This was refilled yesterday but pharmacy states they did not receive.  Verbal order given

## 2013-12-05 NOTE — Patient Instructions (Signed)
Hypokalemia Hypokalemia means that the amount of potassium in the blood is lower than normal.Potassium is a chemical, called an electrolyte, that helps regulate the amount of fluid in the body. It also stimulates muscle contraction and helps nerves function properly.Most of the body's potassium is inside of cells, and only a very small amount is in the blood. Because the amount in the blood is so small, minor changes can be life-threatening. CAUSES  Antibiotics.  Diarrhea or vomiting.  Using laxatives too much, which can cause diarrhea.  Chronic kidney disease.  Water pills (diuretics).  Eating disorders (bulimia).  Low magnesium level.  Sweating a lot. SIGNS AND SYMPTOMS  Weakness.  Constipation.  Fatigue.  Muscle cramps.  Mental confusion.  Skipped heartbeats or irregular heartbeat (palpitations).  Tingling or numbness. DIAGNOSIS  Your health care provider can diagnose hypokalemia with blood tests. In addition to checking your potassium level, your health care provider may also check other lab tests. TREATMENT Hypokalemia can be treated with potassium supplements taken by mouth or adjustments in your current medicines. If your potassium level is very low, you may need to get potassium through a vein (IV) and be monitored in the hospital. A diet high in potassium is also helpful. Foods high in potassium are:  Nuts, such as peanuts and pistachios.  Seeds, such as sunflower seeds and pumpkin seeds.  Peas, lentils, and lima beans.  Whole grain and bran cereals and breads.  Fresh fruit and vegetables, such as apricots, avocado, bananas, cantaloupe, kiwi, oranges, tomatoes, asparagus, and potatoes.  Orange and tomato juices.  Red meats.  Fruit yogurt. HOME CARE INSTRUCTIONS  Take all medicines as prescribed by your health care provider.  Maintain a healthy diet by including nutritious food, such as fruits, vegetables, nuts, whole grains, and lean meats.  If  you are taking a laxative, be sure to follow the directions on the label. SEEK MEDICAL CARE IF:  Your weakness gets worse.  You feel your heart pounding or racing.  You are vomiting or having diarrhea.  You are diabetic and having trouble keeping your blood glucose in the normal range. SEEK IMMEDIATE MEDICAL CARE IF:  You have chest pain, shortness of breath, or dizziness.  You are vomiting or having diarrhea for more than 2 days.  You faint. MAKE SURE YOU:   Understand these instructions.  Will watch your condition.  Will get help right away if you are not doing well or get worse. Document Released: 08/31/2005 Document Revised: 06/21/2013 Document Reviewed: 03/03/2013 [employer] Patient Information 2014 [employer]. Potassium Content of Foods Potassium is a mineral found in many foods and drinks. It helps keep fluids and minerals balanced in your body and also affects how steadily your heart beats. The body needs potassium to control blood pressure and to keep the muscles and nervous system healthy. However, certain health conditions and medicine may require you to eat more or less potassium-rich foods and drinks. Your caregiver or dietitian will tell you how much potassium you should have each day. COMMON SERVING SIZES The list below tells you how big or small common portion sizes are:  1 oz.........4 stacked dice.  3 oz.........Deck of cards.  1 tsp........Tip of little finger.  1 tbsp......Thumb.  2 tbsp......Golf ball.   c...........Half of a fist.  1 c............A fist. FOODS AND DRINKS HIGH IN POTASSIUM More than 200 mg of potassium per serving. A serving size is  c (120 mL or noted gram weight) unless otherwise stated. While all the   items on this list are high in potassium, some items are higher in potassium than others. Fruits  Apricots (sliced), 83 g.  Apricots (dried halves), 3 oz / 24 g.  Avocado (cubed),  c / 50 g.  Banana (sliced), 75  g.  Cantaloupe (cubed), 80 g.  Dates (pitted), 5 whole / 35 g.  Figs (dried), 4 whole / 32 g.  Guava, c / 55 g.  Honeydew, 1 wedge / 85 g.  Kiwi (sliced), 90 g.  Nectarine, 1 small / 129 g.  Orange, 1 medium / 131 g.  Orange juice.  Pomegranate seeds, 87 g.  Pomegranate juice.  Prunes (pitted), 3 whole / 30 g.  Prune juice, 3 oz / 90 mL.  Seedless raisins, 3 tbsp / 27 g. Vegetables  Artichoke,  of a medium / 64 g.  Asparagus (boiled), 90 g.  Baked beans,  c / 63 g.  Bamboo shoots,  c / 38 g.  Beets (cooked slices), 85 g.  Broccoli (boiled), 78 g.  Brussels sprout (boiled), 78 g.  Butternut squash (baked), 103 g.  Chickpea (cooked), 82 g.  Green peas (cooked), 80 g.  Hubbard squash (baked cubes),  c / 68 g.  Kidney beans (cooked), 5 tbsp / 55 g.  Lima beans (cooked),  c / 43 g.  Navy beans (cooked),  c / 61 g.  Potato (baked), 61 g.  Potato (boiled), 78 g.  Pumpkin (boiled), 123 g.  Refried beans,  c / 79 g.  Spinach (cooked),  c / 45 g.  Split peas (cooked),  c / 65 g.  Sun-dried tomatoes, 2 tbsp / 7 g.  Sweet potato (baked),  c / 50 g.  Tomato (chopped or sliced), 90 g.  Tomato juice.  Tomato paste, 4 tsp / 21 g.  Tomato sauce,  c / 61 g.  Vegetable juice.  White mushrooms (cooked), 78 g.  Yam (cooked or baked),  c / 34 g.  Zucchini squash (boiled), 90 g. Other Foods and Drinks  Almonds (whole),  c / 36 g.  Cashews (oil roasted),  c / 32 g.  Chocolate milk.  Chocolate pudding, 142 g.  Clams (steamed), 1.5 oz / 43 g.  Dark chocolate, 1.5 oz / 42 g.  Fish, 3 oz / 85 g.  King crab (steamed), 3 oz / 85 g.  Lobster (steamed), 4 oz / 113 g.  Milk (skim, 1%, 2%, whole), 1 c / 240 mL.  Milk chocolate, 2.3 oz / 66 g.  Milk shake.  Nonfat fruit variety yogurt, 123 g.  Peanuts (oil roasted), 1 oz / 28 g.  Peanut butter, 2 tbsp / 32 g.  Pistachio nuts, 1 oz / 28 g.  Pumpkin seeds, 1 oz / 28  g.  Red meat (broiled, cooked, grilled), 3 oz / 85 g.  Scallops (steamed), 3 oz / 85 g.  Shredded wheat cereal (dry), 3 oblong biscuits / 75 g.  Spaghetti sauce,  c / 66 g.  Sunflower seeds (dry roasted), 1 oz / 28 g.  Veggie burger, 1 patty / 70 g. FOODS MODERATE IN POTASSIUM Between 150 mg and 200 mg per serving. A serving is  c (120 mL or noted gram weight) unless otherwise stated. Fruits  Grapefruit,  of the fruit / 123 g.  Grapefruit juice.  Pineapple juice.  Plums (sliced), 83 g.  Tangerine, 1 large / 120 g. Vegetables  Carrots (boiled), 78 g.  Carrots (sliced), 61 g.  Rhubarb (cooked with   sugar), 120 g.  [place]aga (cooked), 120 g.  Sweet corn (cooked), 75 g.  Yellow snap beans (cooked), 63 g. Other Foods and Drinks   Bagel, 1 bagel / 98 g.  Chicken breast (roasted and chopped),  c / 70 g.  Chocolate ice cream / 66 g.  Pita bread, 1 large / 64 g.  Shrimp (steamed), 4 oz / 113 g.  Swiss cheese (diced), 70 g.  Vanilla ice cream, 66 g.  Vanilla pudding, 140 g. FOODS LOW IN POTASSIUM Less than 150 mg per serving. A serving size is  cup (120 mL or noted gram weight) unless otherwise stated. If you eat more than 1 serving of a food low in potassium, the food may be considered a food high in potassium. Fruits  Apple (slices), 55 g.  Apple juice.  Applesauce, 122 g.  Blackberries, 72 g.  Blueberries, 74 g.  Cranberries, 50 g.  Cranberry juice.  Fruit cocktail, 119 g.  Fruit punch.  Grapes, 46 g.  Grape juice.  Mandarin oranges (canned), 126 g.  Peach (slices), 77 g.  Pineapple (chunks), 83 g.  Raspberries, 62 g.  Red cherries (without pits), 78 g.  Strawberries (sliced), 83 g.  Watermelon (diced), 76 g. Vegetables  Alfalfa sprouts, 17 g.  Bell peppers (sliced), 46 g.  Cabbage (shredded), 35 g.  Cauliflower (boiled), 62 g.  Celery, 51 g.  Collard greens (boiled), 95 g.  Cucumber (sliced), 52 g.  Eggplant  (cubed), 41 g.  Green beans (boiled), 63 g.  Lettuce (shredded), 1 c / 36 g.  Onions (sauteed), 44 g.  Radishes (sliced), 58 g.  Spaghetti squash, 51 g. Other Foods and Drinks  Angel food cake, 1 slice / 28 g.  Black tea.  Brown rice (cooked), 98 g.  Butter croissant, 1 medium / 57 g.  Carbonated soda.  Coffee.  Cheddar cheese (diced), 66 g.  Corn flake cereal (dry), 14 g.  Cottage cheese, 118 g.  Cream of rice cereal (cooked), 122 g.  Cream of wheat cereal (cooked), 126 g.  Crisped rice cereal (dry), 14 g.  Egg (boiled, fried, poached, omelet, scrambled), 1 large / 46 61 g.  English muffin, 1 muffin / 57 g.  Frozen ice pop, 1 pop / 55 g.  Graham cracker, 1 large rectangular cracker / 14 g.  Jelly beans, 112 g.  Non-dairy whipped topping.  Oatmeal, 88 g.  Orange sherbet, 74 g.  Puffed rice cereal (dry), 7 g.  Pasta (cooked), 70 g.  Rice cakes, 4 cakes / 36 g.  Sugared doughnut, 4 oz / 116 g.  White bread, 1 slice / 30 g.  White rice (cooked), 79 93 g.  Wild rice (cooked), 82 g.  Yellow cake, 1 slice / 68 g. Document Released: 04/14/2005 Document Revised: 08/17/2012 Document Reviewed: 01/15/2012 Exit[employer] Patient Information 2014 [employer].  

## 2013-12-05 NOTE — Patient Instructions (Addendum)
Hypokalemia Hypokalemia means that the amount of potassium in the blood is lower than normal.Potassium is a chemical, called an electrolyte, that helps regulate the amount of fluid in the body. It also stimulates muscle contraction and helps nerves function properly.Most of the body's potassium is inside of cells, and only a very small amount is in the blood. Because the amount in the blood is so small, minor changes can be life-threatening. CAUSES  Antibiotics.  Diarrhea or vomiting.  Using laxatives too much, which can cause diarrhea.  Chronic kidney disease.  Water pills (diuretics).  Eating disorders (bulimia).  Low magnesium level.  Sweating a lot. SIGNS AND SYMPTOMS  Weakness.  Constipation.  Fatigue.  Muscle cramps.  Mental confusion.  Skipped heartbeats or irregular heartbeat (palpitations).  Tingling or numbness. DIAGNOSIS  Your health care provider can diagnose hypokalemia with blood tests. In addition to checking your potassium level, your health care provider may also check other lab tests. TREATMENT Hypokalemia can be treated with potassium supplements taken by mouth or adjustments in your current medicines. If your potassium level is very low, you may need to get potassium through a vein (IV) and be monitored in the hospital. A diet high in potassium is also helpful. Foods high in potassium are:  Nuts, such as peanuts and pistachios.  Seeds, such as sunflower seeds and pumpkin seeds.  Peas, lentils, and lima beans.  Whole grain and bran cereals and breads.  Fresh fruit and vegetables, such as apricots, avocado, bananas, cantaloupe, kiwi, oranges, tomatoes, asparagus, and potatoes.  Orange and tomato juices.  Red meats.  Fruit yogurt. HOME CARE INSTRUCTIONS  Take all medicines as prescribed by your health care provider.  Maintain a healthy diet by including nutritious food, such as fruits, vegetables, nuts, whole grains, and lean meats.  If  you are taking a laxative, be sure to follow the directions on the label. SEEK MEDICAL CARE IF:  Your weakness gets worse.  You feel your heart pounding or racing.  You are vomiting or having diarrhea.  You are diabetic and having trouble keeping your blood glucose in the normal range. SEEK IMMEDIATE MEDICAL CARE IF:  You have chest pain, shortness of breath, or dizziness.  You are vomiting or having diarrhea for more than 2 days.  You faint. MAKE SURE YOU:   Understand these instructions.  Will watch your condition.  Will get help right away if you are not doing well or get worse. Document Released: 08/31/2005 Document Revised: 06/21/2013 Document Reviewed: 03/03/2013 [employer] Patient Information 2014 [employer].  

## 2013-12-05 NOTE — Telephone Encounter (Signed)
Revealed negative genetic test results.  Will send her a copy of her results and a letter.

## 2013-12-05 NOTE — Progress Notes (Signed)
  Echocardiogram 2D Echocardiogram has been performed.  Joan Valdez 12/05/2013, 9:18 AM

## 2013-12-05 NOTE — Progress Notes (Signed)
[facility] OFFICE PROGRESS NOTE  Patient Care Team: Susy Frizzle, MD as PCP - General (Family Medicine) Dr. Autumn Messing  DIAGNOSIS: 43 year old female with new diagnosis of triple positive invasive ductal carcinoma of the right breast diagnosed 08/16/2013  STAGE:  Breast cancer of lower-outer quadrant of right female breast  Primary site: Breast (Right)  Staging method: AJCC 7th Edition  Clinical: Stage IIIA (T3, N1, cM0) signed by Deatra Robinson, MD on 09/21/2013 6:07 PM  Summary: Stage IIIA (T3, N1, cM0)  SUMMARY OF ONCOLOGIC HISTORY: #1patient had a routine screening mammogram performed she was noted to have a large mass in the 6:00 position of the right breast measuring 3 cm for the primary mass with another 2 cm of calcifications anteriorly. She was also noted to have a enlarged lymph node in the right axilla. Patient underwent a needle core biopsy of the primary mass as well as axilla. The tumor was ER positive PR positive HER-2 positive with a proliferation marker Ki-67 59%. Patient had MRI of the breasts performed MRI showed right mass to measure 8.2 x 5.6 x 3.1 cm lymph node measured 2.5 cm on the left breast there was a 9 millimeter area of concern.   #2 patient was begun on neoadjuvant chemotherapy consisting of Taxotere, carboplatin, Herceptin, and perjeta beginning on 09/19/13. Total of 6 cycles of initial therapy are planned.  CURRENT THERAPY: S/P Neoadjuvant cycle #4 of TCH/perjeta with day 2 Neulasta   INTERVAL HISTORY: Joan Valdez 43 y.o. female returns for followup visit after cycle 4 of chemotherapy. She indeed did tolerate this cycle much better than previously. She is denying having any diarrhea. She has minimal tingling in her fingertips. She has been pushing fluids her appetite and stable. She has not had any fevers chills or night sweats.  I have reviewed the past medical history, past surgical history, social history and family history with the  patient and they are unchanged from previous note.  ALLERGIES:  is allergic to imitrex; amoxicillin; bactrim; cephalexin; erythromycin; penicillins; strawberry; sulfa antibiotics; and zithromax.  MEDICATIONS:  Current Outpatient Prescriptions  Medication Sig Dispense Refill  . Alum & Mag Hydroxide-Simeth (MAGIC MOUTHWASH W/LIDOCAINE) SOLN 5-8ml q4hrs prn. Swish and spit, may swallow for sore throat.  480 mL  3  . cetirizine (ZYRTEC) 10 MG tablet Take 10 mg by mouth at bedtime as needed.       Marland Kitchen dexamethasone (DECADRON) 4 MG tablet TAKE 2 TABLETS BY MOUTH TWICE A DAY WITH A MEAL STARTING A DAY BEFORE TAXOTERE AS DIRECTED  30 tablet  1  . dexlansoprazole (DEXILANT) 60 MG capsule Take 60 mg by mouth daily.      Marland Kitchen dihydroergotamine (MIGRANAL) 4 MG/ML nasal spray Place 1 spray into the nose as needed for migraine. Use in one nostril as directed.  No more than 4 sprays in one hour      . Eflornithine HCl (VANIQA) 13.9 % cream Apply pea-sized amount as directed  30 g  5  . ibuprofen (ADVIL,MOTRIN) 200 MG tablet Take 400 mg by mouth every 8 (eight) hours as needed. For pain      . levothyroxine (SYNTHROID, LEVOTHROID) 100 MCG tablet Take 100 mcg by mouth daily.       Marland Kitchen lidocaine-prilocaine (EMLA) cream Apply topically as needed.  30 g  6  . liothyronine (CYTOMEL) 25 MCG tablet Take 12.5 mcg by mouth 2 (two) times daily.       Marland Kitchen LORazepam (ATIVAN) 0.5 MG  tablet TAKE 1 TABLET BY MOUTH EVERY 6 HOURS AS NEEDED  30 tablet  0  . mometasone (NASONEX) 50 MCG/ACT nasal spray Place 2 sprays into both nostrils daily.       . Nutritional Supplements (JUICE PLUS FIBRE PO) Take 1 tablet by mouth daily.      . ondansetron (ZOFRAN) 8 MG tablet TAKE 1 TABLET BY MOUTH TWICE A DAY STARTING THE DAY AFTER CHEMO FOR 3 DAYS, THEN AS NEEDED  30 tablet  1  . oxyCODONE-acetaminophen (PERCOCET/ROXICET) 5-325 MG per tablet Take 1-2 tablets by mouth every 4 (four) hours as needed for severe pain.  30 tablet  0  . prochlorperazine  (COMPAZINE) 10 MG tablet TAKE 1 TABLET BY MOUTH EVERY 6 HOURS AS NEEDED FOR NAUSEA OR VOMITING  30 tablet  1  . ranitidine (ZANTAC) 300 MG tablet Take 300 mg by mouth at bedtime.      . topiramate (TOPAMAX) 100 MG tablet Take 200 mg by mouth at bedtime.      . topiramate (TOPAMAX) 100 MG tablet TAKE 2 TABLETS BY MOUTH AT BEDTIME  60 tablet  4  . zolpidem (AMBIEN) 10 MG tablet Take 1 tablet (10 mg total) by mouth at bedtime as needed for sleep.  30 tablet  0  . potassium chloride SA (K-DUR,KLOR-CON) 20 MEQ tablet Take 1 tablet (20 mEq total) by mouth daily.  5 tablet  0  . topiramate (TOPAMAX) 100 MG tablet TAKE 2 TABLETS BY MOUTH AT BEDTIME  60 tablet  4   No current facility-administered medications for this visit.   Facility-Administered Medications Ordered in Other Visits  Medication Dose Route Frequency Provider Last Rate Last Dose  . heparin lock flush 100 unit/mL  500 Units Intravenous Once Deatra Robinson, MD      . sodium chloride 0.9 % injection 10 mL  10 mL Intravenous PRN Deatra Robinson, MD        REVIEW OF SYSTEMS:   Constitutional: Denies fevers, chills or abnormal weight loss Eyes: Denies blurriness of vision Ears, nose, mouth, throat, and face: Denies mucositis or sore throat Respiratory: Denies cough, dyspnea or wheezes Cardiovascular: Denies palpitation, chest discomfort or lower extremity swelling Gastrointestinal:  Denies nausea, heartburn or change in bowel habits Skin: Denies abnormal skin rashes Lymphatics: Denies new lymphadenopathy or easy bruising Neurological:Denies numbness, tingling or new weaknesses Behavioral/Psych: Mood is stable, no new changes  All other systems were reviewed with the patient and are negative.  PHYSICAL EXAMINATION: ECOG PERFORMANCE STATUS: 1 - Symptomatic but completely ambulatory  Filed Vitals:   12/05/13 1357  BP: 130/83  Pulse: 116  Temp: 98.5 F (36.9 C)  Resp: 18   Filed Weights   12/05/13 1357  Weight: 203 lb 6.4 oz  (92.262 kg)    GENERAL:alert, no distress and comfortable SKIN: skin color, texture, turgor are normal, no rashes or significant lesions EYES: normal, Conjunctiva are pink and non-injected, sclera clear OROPHARYNX:no exudate, no erythema and lips, buccal mucosa, and tongue normal  NECK: supple, thyroid normal size, non-tender, without nodularity LYMPH:  no palpable lymphadenopathy in the cervical, axillary or inguinal LUNGS: clear to auscultation and percussion with normal breathing effort HEART: regular rate & rhythm and no murmurs and no lower extremity edema ABDOMEN:abdomen soft, non-tender and normal bowel sounds Musculoskeletal:no cyanosis of digits and no clubbing  NEURO: alert & oriented x 3 with fluent speech, no focal motor/sensory deficits Breasts: right breast normal , mass is not palpable anymore, skin or nipple  changes or axillary nodes, left breast normal without mass, skin or nipple changes or axillary nodes.  LABORATORY DATA:  I have reviewed the data as listed    Component Value Date/Time   NA 142 12/05/2013 1335   NA 138 08/30/2013 0916   K 2.8* 12/05/2013 1335   K 4.0 08/30/2013 0916   CL 105 08/30/2013 0916   CO2 24 12/05/2013 1335   CO2 21 08/30/2013 0916   GLUCOSE 108 12/05/2013 1335   GLUCOSE 95 08/30/2013 0916   BUN 7.9 12/05/2013 1335   BUN 10 08/30/2013 0916   CREATININE 0.7 12/05/2013 1335   CREATININE 0.60 08/30/2013 0916   CALCIUM 9.2 12/05/2013 1335   CALCIUM 9.3 08/30/2013 0916   PROT 6.3* 12/05/2013 1335   ALBUMIN 3.7 12/05/2013 1335   AST 22 12/05/2013 1335   ALT 31 12/05/2013 1335   ALKPHOS 108 12/05/2013 1335   BILITOT 0.46 12/05/2013 1335   GFRNONAA >90 08/30/2013 0916   GFRAA >90 08/30/2013 0916    No results found for this basename: SPEP, UPEP,  kappa and lambda light chains    Lab Results  Component Value Date   WBC 7.9 12/05/2013   NEUTROABS 4.3 12/05/2013   HGB 10.5* 12/05/2013   HCT 30.8* 12/05/2013   MCV 100.3 12/05/2013   PLT 99*  12/05/2013      Chemistry      Component Value Date/Time   NA 142 12/05/2013 1335   NA 138 08/30/2013 0916   K 2.8* 12/05/2013 1335   K 4.0 08/30/2013 0916   CL 105 08/30/2013 0916   CO2 24 12/05/2013 1335   CO2 21 08/30/2013 0916   BUN 7.9 12/05/2013 1335   BUN 10 08/30/2013 0916   CREATININE 0.7 12/05/2013 1335   CREATININE 0.60 08/30/2013 0916      Component Value Date/Time   CALCIUM 9.2 12/05/2013 1335   CALCIUM 9.3 08/30/2013 0916   ALKPHOS 108 12/05/2013 1335   AST 22 12/05/2013 1335   ALT 31 12/05/2013 1335   BILITOT 0.46 12/05/2013 1335       RADIOGRAPHIC STUDIES: I have personally reviewed the radiological images as listed and agreed with the findings in the report. No results found.    ASSESSMENT & PLAN:  43 year old female with  #1 stage IIIa invasive ductal carcinoma of the right breast diagnosed December 2014. Tumor was ER positive PR positive HER-2/neu positive (triple positive). Because of the extent of disease patient was recommended neoadjuvant approach to treatment. She is receiving TCH/perjeta. She has now completed 4 cycles. Today she looks terrific. However her platelets are low at 99,000. White count is normal hemoglobin and hematocrit are on the low side and she is asymptomatic.  #2 FEN: Patient will receive IV fluids today and since she is a little bit dehydrated.  #3 hypokalemia: We will replace potassium in her IV fluids today as well as give her a prescription for oral potassium 20 in each use daily x5 days. Hopefully this will bring her potassium levels a.  #4 anemia/thrombocytopenia both counts are low but patient is asymptomatic. We will continue to monitor. Hopefully in 2 weeks' time her platelets will have normalized and we will proceed with cycle 5.  #5 radiographic studies: Patient is set up to have MRI of the breasts performed to evaluate response to therapy on 12/25/2013. Thereafter she will also be seen by Dr. Marlou Starks.  #6 cardiology: Patient has  been having echocardiograms performed every 3 months. She will also need to  have cardiology consultation so that they can follow her heart function and advise [place] accordingly. Patient is gong to continue Herceptin to finish out a total of one year.  #7 followup: Patient will be seen back in 2 weeks' time for cycle 5 day 1 of TCH/P.   No orders of the defined types were placed in this encounter.   All questions were answered. The patient knows to call the clinic with any problems, questions or concerns. No barriers to learning was detected. I spent 20 minutes counseling the patient face to face. The total time spent in the appointment was 30 minutes and more than 50% was on counseling and review of test results     Marcy Panning, MD 12/05/2013 4:40 PM

## 2013-12-12 ENCOUNTER — Other Ambulatory Visit: Payer: BC Managed Care – PPO

## 2013-12-12 ENCOUNTER — Ambulatory Visit (HOSPITAL_COMMUNITY)
Admission: RE | Admit: 2013-12-12 | Discharge: 2013-12-12 | Disposition: A | Discharge status: Home / Self Care | Payer: BC Managed Care – PPO | Source: Ambulatory Visit | Attending: Internal Medicine | Admitting: Internal Medicine

## 2013-12-12 VITALS — BP 132/74 | HR 108 | Wt 209.8 lb

## 2013-12-12 DIAGNOSIS — R0989 Other specified symptoms and signs involving the circulatory and respiratory systems: Secondary | ICD-10-CM | POA: Insufficient documentation

## 2013-12-12 DIAGNOSIS — R000 Tachycardia, unspecified: Secondary | ICD-10-CM

## 2013-12-12 DIAGNOSIS — C50519 Malignant neoplasm of lower-outer quadrant of unspecified female breast: Secondary | ICD-10-CM | POA: Insufficient documentation

## 2013-12-12 DIAGNOSIS — C50511 Malignant neoplasm of lower-outer quadrant of right female breast: Secondary | ICD-10-CM

## 2013-12-12 DIAGNOSIS — R9431 Abnormal electrocardiogram [ECG] [EKG]: Secondary | ICD-10-CM | POA: Insufficient documentation

## 2013-12-12 DIAGNOSIS — R0609 Other forms of dyspnea: Secondary | ICD-10-CM | POA: Insufficient documentation

## 2013-12-12 DIAGNOSIS — I471 Supraventricular tachycardia, unspecified: Secondary | ICD-10-CM | POA: Insufficient documentation

## 2013-12-12 DIAGNOSIS — R0600 Dyspnea, unspecified: Secondary | ICD-10-CM

## 2013-12-12 NOTE — Patient Instructions (Signed)
Your physician has requested that you have an echocardiogram. Echocardiography is a painless test that uses sound waves to create images of your heart. It provides your doctor with information about the size and shape of your heart and how well your heart's chambers and valves are working. This procedure takes approximately one hour. There are no restrictions for this procedure.  Your physician recommends that you schedule a follow-up appointment in: 6 weeks  

## 2013-12-13 ENCOUNTER — Ambulatory Visit: Payer: BC Managed Care – PPO

## 2013-12-13 DIAGNOSIS — R9431 Abnormal electrocardiogram [ECG] [EKG]: Secondary | ICD-10-CM | POA: Insufficient documentation

## 2013-12-13 DIAGNOSIS — R0609 Other forms of dyspnea: Secondary | ICD-10-CM | POA: Insufficient documentation

## 2013-12-13 DIAGNOSIS — R000 Tachycardia, unspecified: Secondary | ICD-10-CM | POA: Insufficient documentation

## 2013-12-13 DIAGNOSIS — R0600 Dyspnea, unspecified: Secondary | ICD-10-CM | POA: Insufficient documentation

## 2013-12-13 NOTE — Progress Notes (Signed)
Patient ID: Joan Valdez, female   DOB: [birth year], 43 y.o.   MRN: [id number] Oncologist: Dr. Humphrey Rolls  43 yo with recently-diagnosed breast cancer presents for cardiology evaluation given use of Herceptin. Patient has right-sided breast cancer diagnosed in 12/14,  ER+/PR+/HER-2/neu+.  Started neoadjuvant chemo (1/15) with Taxotere, carboplatin, Herceptin, Perjeta with plan for 6 cycles and total of 1 year Herceptin.  Since starting chemotherapy, she has noted her heart race with exertion.  It does not race at rest. No syncope/presyncope. She has also noted some exertional dyspnea with walking since chemo began.  She is not very active in general at this time. No problems when she walks slowly.  No orthopnea, PND, or chest pain.   I reviewed her echo today.  EF is normal and lateral s' is normal.  Global longitudinal strain is less negative than expected, but quality of images used for strain was not ideal.   ECG: sinus tachy, inferior and anterolateral T wave inversions  PMH: 1. GERD 2. Hypothyroidism 3. Breast cancer: On right, ER+/PR+/HER-2/neu+.  Diagnosed 12/14.  Started neoadjuvant chemo (1/15) with Taxotere, carboplatin, Herceptin, Perjeta with plan for 6 cycles and total of 1 year Herceptin. - Echo (3/15) with EF 15-17%, grade I diastolic dysfunction, lateral s' 16.1 cm/sec, global longitudinal strain -12.6% (?accuracy).    SH: Nonsmoker, lives in [place], not working currently.   FH: Grandmother with pacemaker.    ROS: All systems reviewed and negative except as per HPI.   Current Outpatient Prescriptions  Medication Sig Dispense Refill  . Alum & Mag Hydroxide-Simeth (MAGIC MOUTHWASH W/LIDOCAINE) SOLN 5-58m q4hrs prn. Swish and spit, may swallow for sore throat.  480 mL  3  . cetirizine (ZYRTEC) 10 MG tablet Take 10 mg by mouth at bedtime as needed.       .Marland Kitchendexamethasone (DECADRON) 4 MG tablet TAKE 2 TABLETS BY MOUTH TWICE A DAY WITH A MEAL STARTING A DAY BEFORE TAXOTERE AS  DIRECTED  30 tablet  1  . dexlansoprazole (DEXILANT) 60 MG capsule Take 60 mg by mouth daily.      .Marland Kitchendihydroergotamine (MIGRANAL) 4 MG/ML nasal spray Place 1 spray into the nose as needed for migraine. Use in one nostril as directed.  No more than 4 sprays in one hour      . Eflornithine HCl (VANIQA) 13.9 % cream Apply pea-sized amount as directed  30 g  5  . ibuprofen (ADVIL,MOTRIN) 200 MG tablet Take 400 mg by mouth every 8 (eight) hours as needed. For pain      . levothyroxine (SYNTHROID, LEVOTHROID) 100 MCG tablet Take 100 mcg by mouth daily.       .Marland Kitchenlidocaine-prilocaine (EMLA) cream Apply topically as needed.  30 g  6  . liothyronine (CYTOMEL) 25 MCG tablet Take 12.5 mcg by mouth 2 (two) times daily.       .Marland KitchenLORazepam (ATIVAN) 0.5 MG tablet TAKE 1 TABLET BY MOUTH EVERY 6 HOURS AS NEEDED  30 tablet  0  . mometasone (NASONEX) 50 MCG/ACT nasal spray Place 2 sprays into both nostrils daily.       . Nutritional Supplements (JUICE PLUS FIBRE PO) Take 1 tablet by mouth daily.      . ondansetron (ZOFRAN) 8 MG tablet TAKE 1 TABLET BY MOUTH TWICE A DAY STARTING THE DAY AFTER CHEMO FOR 3 DAYS, THEN AS NEEDED  30 tablet  1  . oxyCODONE-acetaminophen (PERCOCET/ROXICET) 5-325 MG per tablet Take 1-2 tablets by mouth every 4 (four) hours as  needed for severe pain.  30 tablet  0  . potassium chloride SA (K-DUR,KLOR-CON) 20 MEQ tablet Take 1 tablet (20 mEq total) by mouth daily.  5 tablet  0  . prochlorperazine (COMPAZINE) 10 MG tablet TAKE 1 TABLET BY MOUTH EVERY 6 HOURS AS NEEDED FOR NAUSEA OR VOMITING  30 tablet  1  . ranitidine (ZANTAC) 300 MG tablet Take 300 mg by mouth at bedtime.      . topiramate (TOPAMAX) 100 MG tablet Take 200 mg by mouth at bedtime.      Marland Kitchen zolpidem (AMBIEN) 10 MG tablet Take 1 tablet (10 mg total) by mouth at bedtime as needed for sleep.  30 tablet  0   No current facility-administered medications for this encounter.   BP 132/74  Pulse 108  Wt 209 lb 12 oz (95.142 kg)  SpO2  97% General: NAD Neck: No JVD, no thyromegaly or thyroid nodule.  Lungs: Clear to auscultation bilaterally with normal respiratory effort. CV: Nondisplaced PMI.  Heart regular S1/S2, no S3/S4, no murmur.  No peripheral edema.  No carotid bruit.  Normal pedal pulses.  Abdomen: Soft, nontender, no hepatosplenomegaly, no distention.  Skin: Intact without lesions or rashes.  Neurologic: Alert and oriented x 3.  Psych: Normal affect. Extremities: No clubbing or cyanosis.  HEENT: Normal.   Assessment/Plan: 1. Breast cancer: She has been getting chemotherapy including Herceptin since 1/15.  I reviewed her March echo.  This showed normal EF and lateral s'.  The global longitudinal strain was less negative than expected, but images were not ideal.  I am not sure if this represents a true abnormality.  I think that the best plan at this time is to continue Herceptin but repeat echo in 6 wks rather than 12 wks.   2. Palpitations: Patient has been getting exertional tachycardia since chemo began.  No palpitations at rest.  She was in sinus tachycardia on ECG right after walking in.  This may be related to deconditioning since chemo began.   3. Exertional dyspnea: Again, possibly related to deconditioning.  I suggested that she try to walk daily for exercise.  However, will also repeat echo sooner to assess for evidence of Herceptin toxicity.  4. Abnormal ECG: Somewhat nonspecific but not normal. Will need to follow LV function closely.   [name] 12/13/2013

## 2013-12-18 ENCOUNTER — Ambulatory Visit (INDEPENDENT_AMBULATORY_CARE_PROVIDER_SITE_OTHER): Payer: BC Managed Care – PPO | Admitting: General Surgery

## 2013-12-18 ENCOUNTER — Encounter (INDEPENDENT_AMBULATORY_CARE_PROVIDER_SITE_OTHER): Payer: Self-pay | Admitting: General Surgery

## 2013-12-18 VITALS — BP 138/82 | HR 78 | Temp 97.1°F | Resp 16 | Ht 62.0 in | Wt 210.6 lb

## 2013-12-18 DIAGNOSIS — C50519 Malignant neoplasm of lower-outer quadrant of unspecified female breast: Secondary | ICD-10-CM

## 2013-12-18 DIAGNOSIS — C50511 Malignant neoplasm of lower-outer quadrant of right female breast: Secondary | ICD-10-CM

## 2013-12-18 NOTE — Patient Instructions (Signed)
Plan for bilateral mastectomy, right sentinel node biopsy and left reconstruction

## 2013-12-18 NOTE — Progress Notes (Signed)
Subjective:     Patient ID: Joan Valdez, female   DOB: 1970/10/03, 43 y.o.   MRN: [id number]  HPI The patient is a 43 year old white female with a locally advanced right breast cancer. She has been receiving neoadjuvant chemotherapy and has been tolerating that well. She still strongly desires bilateral mastectomy. She has met with the plastic surgeon and the plan is for left-sided reconstruction immediately and delayed reconstruction on the right side once her radiation therapy is completed.  Review of Systems  Constitutional: Negative.   HENT: Negative.   Eyes: Negative.   Respiratory: Negative.   Cardiovascular: Negative.   Gastrointestinal: Negative.   Endocrine: Negative.   Genitourinary: Negative.   Musculoskeletal: Negative.   Skin: Negative.   Allergic/Immunologic: Negative.   Neurological: Negative.   Hematological: Negative.   Psychiatric/Behavioral: Negative.        Objective:   Physical Exam  Constitutional: She is oriented to person, place, and time. She appears well-developed and well-nourished.  HENT:  Head: Normocephalic and atraumatic.  Eyes: Conjunctivae and EOM are normal. Pupils are equal, round, and reactive to light.  Neck: Normal range of motion. Neck supple.  Cardiovascular: Normal rate, regular rhythm and normal heart sounds.   Pulmonary/Chest: Effort normal and breath sounds normal.  The mass that was originally palpable in the right breast is no longer palpable. There is no palpable mass in the left breast. There is no palpable axillary, supraclavicular, or cervical lymphadenopathy  Abdominal: Soft. Bowel sounds are normal.  Musculoskeletal: Normal range of motion.  Lymphadenopathy:    She has no cervical adenopathy.  Neurological: She is alert and oriented to person, place, and time.  Skin: Skin is warm and dry.  Psychiatric: She has a normal mood and affect. Her behavior is normal.       Assessment:     The patient has a locally advanced  right breast cancer that has responded well to neoadjuvant chemotherapy.     Plan:     At this point she will finish out the rest of her chemotherapy course. She desires right-sided mastectomy with sentinel node mapping as well as left-sided prophylactic mastectomy with reconstruction by Dr. Harlow Mares. I have discussed with her in detail the risks and benefits of this operation as well as some of the technical aspects and she understands and wishes to proceed. We will plan to do this about 3-4 weeks after the completion of her last chemotherapy dose.

## 2013-12-19 ENCOUNTER — Other Ambulatory Visit (HOSPITAL_BASED_OUTPATIENT_CLINIC_OR_DEPARTMENT_OTHER): Payer: BC Managed Care – PPO

## 2013-12-19 ENCOUNTER — Ambulatory Visit (HOSPITAL_BASED_OUTPATIENT_CLINIC_OR_DEPARTMENT_OTHER): Payer: BC Managed Care – PPO

## 2013-12-19 ENCOUNTER — Telehealth: Payer: Self-pay | Admitting: *Deleted

## 2013-12-19 ENCOUNTER — Ambulatory Visit (HOSPITAL_BASED_OUTPATIENT_CLINIC_OR_DEPARTMENT_OTHER): Payer: BC Managed Care – PPO | Admitting: Adult Health

## 2013-12-19 ENCOUNTER — Encounter: Payer: Self-pay | Admitting: Adult Health

## 2013-12-19 ENCOUNTER — Ambulatory Visit: Payer: BC Managed Care – PPO

## 2013-12-19 VITALS — BP 135/87 | HR 114 | Temp 98.5°F | Resp 20 | Ht 62.0 in | Wt 207.5 lb

## 2013-12-19 DIAGNOSIS — C50519 Malignant neoplasm of lower-outer quadrant of unspecified female breast: Secondary | ICD-10-CM

## 2013-12-19 DIAGNOSIS — Z5111 Encounter for antineoplastic chemotherapy: Secondary | ICD-10-CM

## 2013-12-19 DIAGNOSIS — C50511 Malignant neoplasm of lower-outer quadrant of right female breast: Secondary | ICD-10-CM

## 2013-12-19 DIAGNOSIS — Z5112 Encounter for antineoplastic immunotherapy: Secondary | ICD-10-CM

## 2013-12-19 DIAGNOSIS — Z170 Estrogen receptor positive status [ER+]: Secondary | ICD-10-CM

## 2013-12-19 LAB — COMPREHENSIVE METABOLIC PANEL (CC13)
ALBUMIN: 3.7 g/dL (ref 3.5–5.0)
ALT: 30 U/L (ref 0–55)
ANION GAP: 11 meq/L (ref 3–11)
AST: 21 U/L (ref 5–34)
Alkaline Phosphatase: 81 U/L (ref 40–150)
BUN: 11.6 mg/dL (ref 7.0–26.0)
CHLORIDE: 113 meq/L — AB (ref 98–109)
CO2: 19 mEq/L — ABNORMAL LOW (ref 22–29)
Calcium: 9.5 mg/dL (ref 8.4–10.4)
Creatinine: 0.7 mg/dL (ref 0.6–1.1)
GLUCOSE: 186 mg/dL — AB (ref 70–140)
Potassium: 4.5 mEq/L (ref 3.5–5.1)
SODIUM: 143 meq/L (ref 136–145)
TOTAL PROTEIN: 6.9 g/dL (ref 6.4–8.3)
Total Bilirubin: 0.27 mg/dL (ref 0.20–1.20)

## 2013-12-19 LAB — CBC WITH DIFFERENTIAL/PLATELET
BASO%: 0.2 % (ref 0.0–2.0)
Basophils Absolute: 0 10*3/uL (ref 0.0–0.1)
EOS ABS: 0 10*3/uL (ref 0.0–0.5)
EOS%: 0 % (ref 0.0–7.0)
HCT: 29.9 % — ABNORMAL LOW (ref 34.8–46.6)
HGB: 10.5 g/dL — ABNORMAL LOW (ref 11.6–15.9)
LYMPH#: 1.2 10*3/uL (ref 0.9–3.3)
LYMPH%: 17.5 % (ref 14.0–49.7)
MCH: 37.1 pg — ABNORMAL HIGH (ref 25.1–34.0)
MCHC: 35 g/dL (ref 31.5–36.0)
MCV: 106 fL — ABNORMAL HIGH (ref 79.5–101.0)
MONO#: 0.2 10*3/uL (ref 0.1–0.9)
MONO%: 2.6 % (ref 0.0–14.0)
NEUT#: 5.7 10*3/uL (ref 1.5–6.5)
NEUT%: 79.7 % — ABNORMAL HIGH (ref 38.4–76.8)
Platelets: 177 10*3/uL (ref 145–400)
RBC: 2.83 10*6/uL — AB (ref 3.70–5.45)
RDW: 19.7 % — ABNORMAL HIGH (ref 11.2–14.5)
WBC: 7.1 10*3/uL (ref 3.9–10.3)

## 2013-12-19 MED ORDER — TRASTUZUMAB CHEMO INJECTION 440 MG
6.0000 mg/kg | Freq: Once | INTRAVENOUS | Status: AC
  Administered 2013-12-19: 588 mg via INTRAVENOUS
  Filled 2013-12-19: qty 28

## 2013-12-19 MED ORDER — DIPHENHYDRAMINE HCL 25 MG PO CAPS
50.0000 mg | ORAL_CAPSULE | Freq: Once | ORAL | Status: AC
  Administered 2013-12-19: 50 mg via ORAL

## 2013-12-19 MED ORDER — ONDANSETRON 16 MG/50ML IVPB (CHCC)
16.0000 mg | Freq: Once | INTRAVENOUS | Status: AC
  Administered 2013-12-19: 16 mg via INTRAVENOUS

## 2013-12-19 MED ORDER — LORAZEPAM 0.5 MG PO TABS: 0.5000 mg | ORAL_TABLET | Freq: Four times a day (QID) | ORAL | Status: DC | PRN

## 2013-12-19 MED ORDER — SODIUM CHLORIDE 0.9 % IV SOLN
900.0000 mg | Freq: Once | INTRAVENOUS | Status: AC
  Administered 2013-12-19: 900 mg via INTRAVENOUS
  Filled 2013-12-19: qty 90

## 2013-12-19 MED ORDER — DIPHENHYDRAMINE HCL 25 MG PO CAPS
ORAL_CAPSULE | ORAL | Status: AC
  Filled 2013-12-19: qty 2

## 2013-12-19 MED ORDER — ZOLPIDEM TARTRATE 10 MG PO TABS: 10.0000 mg | ORAL_TABLET | Freq: Every evening | ORAL | Status: DC | PRN

## 2013-12-19 MED ORDER — PERTUZUMAB CHEMO INJECTION 420 MG/14ML
420.0000 mg | Freq: Once | INTRAVENOUS | Status: AC
  Administered 2013-12-19: 420 mg via INTRAVENOUS
  Filled 2013-12-19: qty 14

## 2013-12-19 MED ORDER — SODIUM CHLORIDE 0.9 % IV SOLN
75.0000 mg/m2 | Freq: Once | INTRAVENOUS | Status: AC
  Administered 2013-12-19: 150 mg via INTRAVENOUS
  Filled 2013-12-19: qty 15

## 2013-12-19 MED ORDER — ACETAMINOPHEN 325 MG PO TABS
650.0000 mg | ORAL_TABLET | Freq: Once | ORAL | Status: AC
  Administered 2013-12-19: 650 mg via ORAL

## 2013-12-19 MED ORDER — DEXAMETHASONE SODIUM PHOSPHATE 20 MG/5ML IJ SOLN
INTRAMUSCULAR | Status: AC
  Filled 2013-12-19: qty 5

## 2013-12-19 MED ORDER — HEPARIN SOD (PORK) LOCK FLUSH 100 UNIT/ML IV SOLN
500.0000 [IU] | Freq: Once | INTRAVENOUS | Status: AC | PRN
  Administered 2013-12-19: 500 [IU]
  Filled 2013-12-19: qty 5

## 2013-12-19 MED ORDER — DEXAMETHASONE SODIUM PHOSPHATE 20 MG/5ML IJ SOLN
20.0000 mg | Freq: Once | INTRAMUSCULAR | Status: AC
  Administered 2013-12-19: 20 mg via INTRAVENOUS

## 2013-12-19 MED ORDER — SODIUM CHLORIDE 0.9 % IV SOLN
Freq: Once | INTRAVENOUS | Status: AC
  Administered 2013-12-19: 10:00:00 via INTRAVENOUS

## 2013-12-19 MED ORDER — ONDANSETRON 16 MG/50ML IVPB (CHCC)
INTRAVENOUS | Status: AC
  Filled 2013-12-19: qty 16

## 2013-12-19 MED ORDER — SODIUM CHLORIDE 0.9 % IJ SOLN
10.0000 mL | INTRAMUSCULAR | Status: DC | PRN
  Administered 2013-12-19: 10 mL
  Filled 2013-12-19: qty 10

## 2013-12-19 MED ORDER — ACETAMINOPHEN 325 MG PO TABS
ORAL_TABLET | ORAL | Status: AC
  Filled 2013-12-19: qty 2

## 2013-12-19 NOTE — Patient Instructions (Signed)
[facility] Discharge Instructions for Patients Receiving Chemotherapy  Today you received the following chemotherapy agents :  Herceptin, Perjeta, Taxotere, Carboplatin.  To help prevent nausea and vomiting after your treatment, we encourage you to take your nausea medication as prescribed by your physician.   If you develop nausea and vomiting that is not controlled by your nausea medication, call the clinic.   BELOW ARE SYMPTOMS THAT SHOULD BE REPORTED IMMEDIATELY:  *FEVER GREATER THAN 100.5 F  *CHILLS WITH OR WITHOUT FEVER  NAUSEA AND VOMITING THAT IS NOT CONTROLLED WITH YOUR NAUSEA MEDICATION  *UNUSUAL SHORTNESS OF BREATH  *UNUSUAL BRUISING OR BLEEDING  TENDERNESS IN MOUTH AND THROAT WITH OR WITHOUT PRESENCE OF ULCERS  *URINARY PROBLEMS  *BOWEL PROBLEMS  UNUSUAL RASH Items with * indicate a potential emergency and should be followed up as soon as possible.  Feel free to call the clinic you have any questions or concerns. The clinic phone number is [phone].    

## 2013-12-19 NOTE — Telephone Encounter (Signed)
Pt had an echo 09/15/2013 and optison was used for test. Pt states it was sent to her pharmacy and she picked it up, pt thought the vial was a cancer med and took it to her app. Pt is now calling to seek reimbursement. Pt was told that I will turn this over to [facility] for review and will be notified with in 2 days for an update.

## 2013-12-19 NOTE — Progress Notes (Signed)
Hematology and Oncology Follow Up Visit  Joan Valdez [id number] 43 y.o. 12/19/2013 3:49 PM     Principle Diagnosis:Joan Valdez 43 y.o. 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   Prior Therapy:  #1patient had a routine screening mammogram performed she was noted to have a large mass in the 6:00 position of the right breast measuring 3 cm for the primary mass with another 2 cm of calcifications anteriorly. She was also noted to have a enlarged lymph node in the right axilla. Patient underwent a needle core biopsy of the primary mass as well as axilla. The tumor was ER positive PR positive HER-2 positive with a proliferation marker Ki-67 59%. Patient had MRI of the breasts performed MRI showed right mass to measure 8.2 x 5.6 x 3.1 cm lymph node measured 2.5 cm on the left breast there was a 9 millimeter area of concern.   #2 patient was begun on neoadjuvant chemotherapy consisting of Taxotere, carboplatin, Herceptin, and perjeta beginning on 09/19/13. Total of 6 cycles of initial therapy are planned.   Current therapy:  Neoadjuvant Taxotere, Carboplatin, Herceptin, Perjeta cycle 5 day 1  Interim History: Joan Valdez 43 y.o. 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  She is currently undergoing neoadjuvant chemotherapy consisting of Taxotere, Carboplatin, Herceptin, Perjeta on day 1 of a 21 day cycle with Neulasta support on day 2.  She is doing well today.  She had a f/u appt with Dr. Marlou Starks yesterday who couldn't palpate her breast mass.  She denies fevers, chills, nausea, vomiting, constipation, diarrhea, numbness or tinglining in her fingertips/toes, nail changes, skin changes, or any further concerns.    Medications:  Current Outpatient Prescriptions  Medication Sig Dispense Refill  . Alum & Mag Hydroxide-Simeth (MAGIC MOUTHWASH W/LIDOCAINE) SOLN 5-67ml q4hrs prn. Swish and spit, may  swallow for sore throat.  480 mL  3  . cetirizine (ZYRTEC) 10 MG tablet Take 10 mg by mouth at bedtime as needed.       Marland Kitchen dexamethasone (DECADRON) 4 MG tablet TAKE 2 TABLETS BY MOUTH TWICE A DAY WITH A MEAL STARTING A DAY BEFORE TAXOTERE AS DIRECTED  30 tablet  1  . dexlansoprazole (DEXILANT) 60 MG capsule Take 60 mg by mouth daily.      Marland Kitchen dihydroergotamine (MIGRANAL) 4 MG/ML nasal spray Place 1 spray into the nose as needed for migraine. Use in one nostril as directed.  No more than 4 sprays in one hour      . Eflornithine HCl (VANIQA) 13.9 % cream Apply pea-sized amount as directed  30 g  5  . ibuprofen (ADVIL,MOTRIN) 200 MG tablet Take 400 mg by mouth every 8 (eight) hours as needed. For pain      . levothyroxine (SYNTHROID, LEVOTHROID) 100 MCG tablet Take 100 mcg by mouth daily.       Marland Kitchen lidocaine-prilocaine (EMLA) cream Apply topically as needed.  30 g  6  . liothyronine (CYTOMEL) 25 MCG tablet Take 12.5 mcg by mouth 2 (two) times daily.       Marland Kitchen LORazepam (ATIVAN) 0.5 MG tablet Take 1 tablet (0.5 mg total) by mouth every 6 (six) hours as needed for anxiety.  30 tablet  0  . mometasone (NASONEX) 50 MCG/ACT nasal spray Place 2 sprays into both nostrils daily.       . Nutritional Supplements (JUICE PLUS FIBRE PO) Take 1 tablet by mouth daily.      Marland Kitchen  ondansetron (ZOFRAN) 8 MG tablet TAKE 1 TABLET BY MOUTH TWICE A DAY STARTING THE DAY AFTER CHEMO FOR 3 DAYS, THEN AS NEEDED  30 tablet  1  . ranitidine (ZANTAC) 300 MG tablet Take 300 mg by mouth at bedtime.      . topiramate (TOPAMAX) 100 MG tablet Take 200 mg by mouth at bedtime.      Marland Kitchen zolpidem (AMBIEN) 10 MG tablet Take 1 tablet (10 mg total) by mouth at bedtime as needed for sleep.  30 tablet  0  . potassium chloride SA (K-DUR,KLOR-CON) 20 MEQ tablet Take 1 tablet (20 mEq total) by mouth daily.  5 tablet  0  . prochlorperazine (COMPAZINE) 10 MG tablet TAKE 1 TABLET BY MOUTH EVERY 6 HOURS AS NEEDED FOR NAUSEA OR VOMITING  30 tablet  1   No  current facility-administered medications for this visit.   Facility-Administered Medications Ordered in Other Visits  Medication Dose Route Frequency Provider Last Rate Last Dose  . sodium chloride 0.9 % injection 10 mL  10 mL Intracatheter PRN Deatra Robinson, MD   10 mL at 12/19/13 1444     Allergies:  Allergies  Allergen Reactions  . Imitrex [Sumatriptan Base] Anaphylaxis  . Amoxicillin Hives  . Bactrim Hives  . Cephalexin Hives  . Erythromycin Hives  . Penicillins Hives  . Strawberry Hives  . Sulfa Antibiotics Hives  . Zithromax [Azithromycin Dihydrate] Hives    Medical History: Past Medical History  Diagnosis Date  . PCO (polycystic ovaries)   . Hypothyroid   . Allergic rhinitis   . Chronic headache   . Acid reflux   . Cervical dysplasia   . Stone, kidney     Hx: of  . Cancer     ;Breast cancer; pre-cancerous cervical cells  . 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 Cesarean section  2001  . Colposcopy    . Cervical biopsy  w/ loop electrode excision  2002  . Eye surgery      Hx: of metal fragment removed  . Portacath placement N/A 09/04/2013    Procedure: INSERTION PORT-A-CATH;  Surgeon: Merrie Roof, MD;  Location: [facility];  Service: General;  Laterality: N/A;     Review of Systems: A 10 point review of systems was conducted and is otherwise negative except for what is noted above.     Physical Exam: Blood pressure 135/87, pulse 114, temperature 98.5 F (36.9 C), temperature source Oral, resp. rate 20, height $RemoveBe'5\' 2"'IGOWsRDxR$  (1.575 m), weight 207 lb 8 oz (94.121 kg). GENERAL: Patient is a well appearing female in no acute distress HEENT:  Sclerae anicteric.  Oropharynx clear and moist. No ulcerations or evidence of oropharyngeal candidiasis. Neck is supple.  NODES:  No cervical, supraclavicular, or axillary lymphadenopathy palpated.  BREAST EXAM:  Deferred. LUNGS:  Clear  to auscultation bilaterally.  No wheezes or rhonchi. HEART:  Regular rate and rhythm. No murmur appreciated. ABDOMEN:  Soft, nontender.  Positive, normoactive bowel sounds. No organomegaly palpated. MSK:  No focal spinal tenderness to palpation. Full range of motion bilaterally in the upper extremities. EXTREMITIES:  No peripheral edema.   SKIN:  Clear with no obvious rashes or skin changes. No nail dyscrasia. NEURO:  Nonfocal. Well oriented.  Appropriate affect. ECOG PERFORMANCE STATUS: 0 - Asymptomatic   Lab Results: Lab Results  Component Value Date   WBC 7.1 12/19/2013  HGB 10.5* 12/19/2013   HCT 29.9* 12/19/2013   MCV 106.0* 12/19/2013   PLT 177 12/19/2013     Chemistry      Component Value Date/Time   NA 143 12/19/2013 0838   NA 138 08/30/2013 0916   K 4.5 12/19/2013 0838   K 4.0 08/30/2013 0916   CL 105 08/30/2013 0916   CO2 19* 12/19/2013 0838   CO2 21 08/30/2013 0916   BUN 11.6 12/19/2013 0838   BUN 10 08/30/2013 0916   CREATININE 0.7 12/19/2013 0838   CREATININE 0.60 08/30/2013 0916      Component Value Date/Time   CALCIUM 9.5 12/19/2013 0838   CALCIUM 9.3 08/30/2013 0916   ALKPHOS 81 12/19/2013 0838   AST 21 12/19/2013 0838   ALT 30 12/19/2013 0838   BILITOT 0.27 12/19/2013 0838     Assessment and Plan: [name] 43 y.o. female with  1. Stage IIIA triple positive invasive ductal carcinoma of the right breast.  She is currently undergoing neoadjuvant chemotherapy with Taxotere, Carboplatin, Herceptin, Perjeta.  She receives this treatment on day 1 of a 21 day cycle.  A total of 6 cycles are planned.  Her CBC is stable today.  She will proceed with chemotherapy today.  The MRI of her breasts is scheduled on 12/25/13.  2.  Cardiac:  Her DOE is stable.  She was last evaluated with an echocardiogram on 12/05/2013 that demonstrated a LVEF of 65-70%.  She was evaluated by Dr. Aundra Dubin on 12/12/13 who cleared her to continue herceptin but recommended 6 week f/u instead of 12 weeks.  She is  scheduled for f/u with him on 01/22/14.  3.  Tachycardia/dyspnea.  This remains stable.  She continues to be slightly tachycardic.  She is walking BID as requested by cardiology.  She denies any worsening in this.    The patient will return tomorrow for Neulasta and in one week for labs, and evaluation for chemotoxicities.    I spent 25 minutes counseling the patient face to face.  The total time spent in the appointment was 30 minutes.  Minette Headland, [place] [phone] 12/19/2013 3:49 PM

## 2013-12-20 ENCOUNTER — Ambulatory Visit (HOSPITAL_BASED_OUTPATIENT_CLINIC_OR_DEPARTMENT_OTHER): Payer: BC Managed Care – PPO

## 2013-12-20 ENCOUNTER — Telehealth: Payer: Self-pay | Admitting: *Deleted

## 2013-12-20 VITALS — BP 139/71 | HR 117 | Temp 98.8°F

## 2013-12-20 DIAGNOSIS — Z5189 Encounter for other specified aftercare: Secondary | ICD-10-CM

## 2013-12-20 DIAGNOSIS — C50519 Malignant neoplasm of lower-outer quadrant of unspecified female breast: Secondary | ICD-10-CM

## 2013-12-20 DIAGNOSIS — C50511 Malignant neoplasm of lower-outer quadrant of right female breast: Secondary | ICD-10-CM

## 2013-12-20 MED ORDER — PEGFILGRASTIM INJECTION 6 MG/0.6ML
6.0000 mg | Freq: Once | SUBCUTANEOUS | Status: AC
  Administered 2013-12-20: 6 mg via SUBCUTANEOUS
  Filled 2013-12-20: qty 0.6

## 2013-12-20 NOTE — Telephone Encounter (Signed)
Per staff message and POF I have scheduled appts.  JMW  

## 2013-12-21 ENCOUNTER — Ambulatory Visit: Payer: BC Managed Care – PPO

## 2013-12-25 ENCOUNTER — Ambulatory Visit
Admission: RE | Admit: 2013-12-25 | Discharge: 2013-12-25 | Disposition: A | Discharge status: Home / Self Care | Payer: BC Managed Care – PPO | Source: Ambulatory Visit | Attending: Oncology | Admitting: Oncology

## 2013-12-25 DIAGNOSIS — C50511 Malignant neoplasm of lower-outer quadrant of right female breast: Secondary | ICD-10-CM

## 2013-12-25 MED ORDER — GADOBENATE DIMEGLUMINE 529 MG/ML IV SOLN
20.0000 mL | Freq: Once | INTRAVENOUS | Status: AC | PRN
  Administered 2013-12-25: 20 mL via INTRAVENOUS

## 2013-12-26 ENCOUNTER — Other Ambulatory Visit (HOSPITAL_BASED_OUTPATIENT_CLINIC_OR_DEPARTMENT_OTHER): Payer: BC Managed Care – PPO

## 2013-12-26 ENCOUNTER — Encounter: Payer: Self-pay | Admitting: Oncology

## 2013-12-26 ENCOUNTER — Ambulatory Visit (HOSPITAL_BASED_OUTPATIENT_CLINIC_OR_DEPARTMENT_OTHER): Payer: BC Managed Care – PPO

## 2013-12-26 ENCOUNTER — Ambulatory Visit (HOSPITAL_BASED_OUTPATIENT_CLINIC_OR_DEPARTMENT_OTHER): Payer: BC Managed Care – PPO | Admitting: Oncology

## 2013-12-26 VITALS — BP 126/79 | HR 114 | Temp 98.5°F

## 2013-12-26 VITALS — BP 118/80 | HR 85

## 2013-12-26 VITALS — BP 126/79 | HR 114 | Temp 98.5°F | Resp 18 | Ht 62.0 in | Wt 201.6 lb

## 2013-12-26 DIAGNOSIS — C50919 Malignant neoplasm of unspecified site of unspecified female breast: Secondary | ICD-10-CM

## 2013-12-26 DIAGNOSIS — Z5111 Encounter for antineoplastic chemotherapy: Secondary | ICD-10-CM

## 2013-12-26 DIAGNOSIS — C773 Secondary and unspecified malignant neoplasm of axilla and upper limb lymph nodes: Secondary | ICD-10-CM

## 2013-12-26 DIAGNOSIS — E860 Dehydration: Secondary | ICD-10-CM

## 2013-12-26 DIAGNOSIS — C50511 Malignant neoplasm of lower-outer quadrant of right female breast: Secondary | ICD-10-CM

## 2013-12-26 DIAGNOSIS — E876 Hypokalemia: Secondary | ICD-10-CM

## 2013-12-26 DIAGNOSIS — R112 Nausea with vomiting, unspecified: Secondary | ICD-10-CM

## 2013-12-26 LAB — CBC WITH DIFFERENTIAL/PLATELET
BASO%: 0.2 % (ref 0.0–2.0)
BASOS ABS: 0.1 10*3/uL (ref 0.0–0.1)
EOS%: 0 % (ref 0.0–7.0)
Eosinophils Absolute: 0 10*3/uL (ref 0.0–0.5)
HCT: 35.7 % (ref 34.8–46.6)
HGB: 12.2 g/dL (ref 11.6–15.9)
LYMPH#: 5.8 10*3/uL — AB (ref 0.9–3.3)
LYMPH%: 15.9 % (ref 14.0–49.7)
MCH: 36.2 pg — AB (ref 25.1–34.0)
MCHC: 34.3 g/dL (ref 31.5–36.0)
MCV: 105.5 fL — AB (ref 79.5–101.0)
MONO#: 4.4 10*3/uL — ABNORMAL HIGH (ref 0.1–0.9)
MONO%: 11.9 % (ref 0.0–14.0)
NEUT#: 26.3 10*3/uL — ABNORMAL HIGH (ref 1.5–6.5)
NEUT%: 72 % (ref 38.4–76.8)
Platelets: 344 10*3/uL (ref 145–400)
RBC: 3.38 10*6/uL — AB (ref 3.70–5.45)
RDW: 17.2 % — AB (ref 11.2–14.5)
WBC: 36.6 10*3/uL — ABNORMAL HIGH (ref 3.9–10.3)

## 2013-12-26 LAB — COMPREHENSIVE METABOLIC PANEL (CC13)
ALK PHOS: 174 U/L — AB (ref 40–150)
ALT: 22 U/L (ref 0–55)
AST: 21 U/L (ref 5–34)
Albumin: 4.1 g/dL (ref 3.5–5.0)
Anion Gap: 15 mEq/L — ABNORMAL HIGH (ref 3–11)
BUN: 17.5 mg/dL (ref 7.0–26.0)
CO2: 23 mEq/L (ref 22–29)
Calcium: 9.1 mg/dL (ref 8.4–10.4)
Chloride: 103 mEq/L (ref 98–109)
Creatinine: 0.8 mg/dL (ref 0.6–1.1)
Glucose: 113 mg/dl (ref 70–140)
Potassium: 3 mEq/L — CL (ref 3.5–5.1)
Sodium: 140 mEq/L (ref 136–145)
Total Bilirubin: 0.31 mg/dL (ref 0.20–1.20)
Total Protein: 7.1 g/dL (ref 6.4–8.3)

## 2013-12-26 MED ORDER — POTASSIUM CHLORIDE CRYS ER 20 MEQ PO TBCR: 20.0000 meq | EXTENDED_RELEASE_TABLET | Freq: Two times a day (BID) | ORAL | Status: DC

## 2013-12-26 MED ORDER — HEPARIN SOD (PORK) LOCK FLUSH 100 UNIT/ML IV SOLN
500.0000 [IU] | Freq: Once | INTRAVENOUS | Status: AC
  Administered 2013-12-26: 500 [IU] via INTRAVENOUS
  Filled 2013-12-26: qty 5

## 2013-12-26 MED ORDER — SODIUM CHLORIDE 0.9 % IJ SOLN
10.0000 mL | INTRAMUSCULAR | Status: DC | PRN
  Administered 2013-12-26: 10 mL via INTRAVENOUS
  Filled 2013-12-26: qty 10

## 2013-12-26 MED ORDER — SODIUM CHLORIDE 0.9 % IV SOLN
INTRAVENOUS | Status: DC
  Administered 2013-12-26: 15:00:00 via INTRAVENOUS
  Filled 2013-12-26: qty 1000

## 2013-12-26 MED ORDER — ONDANSETRON 16 MG/50ML IVPB (CHCC)
INTRAVENOUS | Status: AC
  Filled 2013-12-26: qty 16

## 2013-12-26 MED ORDER — ONDANSETRON 16 MG/50ML IVPB (CHCC)
16.0000 mg | Freq: Once | INTRAVENOUS | Status: DC
  Administered 2013-12-26: 16 mg via INTRAVENOUS

## 2013-12-26 MED ORDER — GOSERELIN ACETATE 3.6 MG ~~LOC~~ IMPL
3.6000 mg | DRUG_IMPLANT | Freq: Once | SUBCUTANEOUS | Status: AC
  Administered 2013-12-26: 3.6 mg via SUBCUTANEOUS
  Filled 2013-12-26: qty 3.6

## 2013-12-26 NOTE — Patient Instructions (Signed)
Dehydration, Adult Dehydration is when you lose more fluids from the body than you take in. Vital organs like the kidneys, brain, and heart cannot function without a proper amount of fluids and salt. Any loss of fluids from the body can cause dehydration.  CAUSES   Vomiting.  Diarrhea.  Excessive sweating.  Excessive urine output.  Fever. SYMPTOMS  Mild dehydration  Thirst.  Dry lips.  Slightly dry mouth. Moderate dehydration  Very dry mouth.  Sunken eyes.  Skin does not bounce back quickly when lightly pinched and released.  Dark urine and decreased urine production.  Decreased tear production.  Headache. Severe dehydration  Very dry mouth.  Extreme thirst.  Rapid, weak pulse (more than 100 beats per minute at rest).  Cold hands and feet.  Not able to sweat in spite of heat and temperature.  Rapid breathing.  Blue lips.  Confusion and lethargy.  Difficulty being awakened.  Minimal urine production.  No tears. DIAGNOSIS  Your caregiver will diagnose dehydration based on your symptoms and your exam. Blood and urine tests will help confirm the diagnosis. The diagnostic evaluation should also identify the cause of dehydration. TREATMENT  Treatment of mild or moderate dehydration can often be done at home by increasing the amount of fluids that you drink. It is best to drink small amounts of fluid more often. Drinking too much at one time can make vomiting worse. Refer to the home care instructions below. Severe dehydration needs to be treated at the hospital where you will probably be given intravenous (IV) fluids that contain water and electrolytes. HOME CARE INSTRUCTIONS   Ask your caregiver about specific rehydration instructions.  Drink enough fluids to keep your urine clear or pale yellow.  Drink small amounts frequently if you have nausea and vomiting.  Eat as you normally do.  Avoid:  Foods or drinks high in sugar.  Carbonated  drinks.  Juice.  Extremely hot or cold fluids.  Drinks with caffeine.  Fatty, greasy foods.  Alcohol.  Tobacco.  Overeating.  Gelatin desserts.  Wash your hands well to avoid spreading bacteria and viruses.  Only take over-the-counter or prescription medicines for pain, discomfort, or fever as directed by your caregiver.  Ask your caregiver if you should continue all prescribed and over-the-counter medicines.  Keep all follow-up appointments with your caregiver. SEEK MEDICAL CARE IF:  You have abdominal pain and it increases or stays in one area (localizes).  You have a rash, stiff neck, or severe headache.  You are irritable, sleepy, or difficult to awaken.  You are weak, dizzy, or extremely thirsty. SEEK IMMEDIATE MEDICAL CARE IF:   You are unable to keep fluids down or you get worse despite treatment.  You have frequent episodes of vomiting or diarrhea.  You have blood or green matter (bile) in your vomit.  You have blood in your stool or your stool looks black and tarry.  You have not urinated in 6 to 8 hours, or you have only urinated a small amount of very dark urine.  You have a fever.  You faint. MAKE SURE YOU:   Understand these instructions.  Will watch your condition.  Will get help right away if you are not doing well or get worse. Document Released: 08/31/2005 Document Revised: 11/23/2011 Document Reviewed: 04/20/2011 [employer] Patient Information 2014 [employer].  

## 2013-12-26 NOTE — Patient Instructions (Signed)
Fluids with potassium and zofran today  We will see you back in 2 weeks for cycle 6 of treatment

## 2013-12-26 NOTE — Progress Notes (Signed)
[facility] OFFICE PROGRESS NOTE  Patient Care Team: Susy Frizzle, MD as PCP - General (Family Medicine) Dr. Autumn Messing  DIAGNOSIS: 43 year old female with new diagnosis of triple positive invasive ductal carcinoma of the right breast diagnosed 08/16/2013  STAGE:  Breast cancer of lower-outer quadrant of right female breast  Primary site: Breast (Right)  Staging method: AJCC 7th Edition  Clinical: Stage IIIA (T3, N1, cM0) signed by Deatra Robinson, MD on 09/21/2013 6:07 PM  Summary: Stage IIIA (T3, N1, cM0)  SUMMARY OF ONCOLOGIC HISTORY: #1patient had a routine screening mammogram performed she was noted to have a large mass in the 6:00 position of the right breast measuring 3 cm for the primary mass with another 2 cm of calcifications anteriorly. She was also noted to have a enlarged lymph node in the right axilla. Patient underwent a needle core biopsy of the primary mass as well as axilla. The tumor was ER positive PR positive HER-2 positive with a proliferation marker Ki-67 59%. Patient had MRI of the breasts performed MRI showed right mass to measure 8.2 x 5.6 x 3.1 cm lymph node measured 2.5 cm on the left breast there was a 9 millimeter area of concern.   #2 patient was begun on neoadjuvant chemotherapy consisting of Taxotere, carboplatin, Herceptin, and perjeta beginning on 09/19/13. Total of 6 cycles of initial therapy are planned.  CURRENT THERAPY: S/P Neoadjuvant cycle #5 of TCH/perjeta with day 2 Neulasta   INTERVAL HISTORY: Shanigua Gibb 43 y.o. female returns for followup after receiving cycle 5 of her chemotherapy. She also had MRI of the breasts performed. She has had interval reduction in the size of the tumor in the right breast. On my examination I am not able to feel any masses. We went over the MRI results in detail. We'll also of compared today's MRI with her prior MRI from December. Clinically she's doing well she is nauseous today after having received  her chemotherapy last week. Her potassium is also low. We will replace this. Remainder of the review of systems is negative and as below.  Past Medical History  Diagnosis Date  . PCO (polycystic ovaries)   . Hypothyroid   . Allergic rhinitis   . Chronic headache   . Acid reflux   . Cervical dysplasia   . Stone, kidney     Hx: of  . Cancer     ;Breast cancer; pre-cancerous cervical cells  . 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 Marital Status: Married    Spouse Name: N/A    Number of Children: 1  . Years of Education: N/A   Occupational History  . Pharmacist, hospital   .  [place] History Main Topics  . Smoking status: Never Smoker   . Smokeless tobacco: Never Used  . Alcohol Use: 1.0 oz/week    2 drink(s) per week     Comment: socially.  . Drug Use: No  . Sexual Activity: Yes    Birth Control/ Protection: Pill   Other Topics Concern  . Not on file   Social History Narrative  . No narrative on file   Past Surgical History  Procedure Laterality Date  . Cesarean section  2001  . Colposcopy    . Cervical biopsy  w/ loop electrode excision  2002  . Eye surgery      Hx: of metal fragment removed  .  Portacath placement N/A 09/04/2013    Procedure: INSERTION PORT-A-CATH;  Surgeon: Caleen Essex III, MD;  Location: [facility];  Service: General;  Laterality: N/A;     ALLERGIES:  is allergic to imitrex; amoxicillin; bactrim; cephalexin; erythromycin; penicillins; strawberry; sulfa antibiotics; and zithromax.  MEDICATIONS:  Current Outpatient Prescriptions  Medication Sig Dispense Refill  . Alum & Mag Hydroxide-Simeth (MAGIC MOUTHWASH W/LIDOCAINE) SOLN 5-59ml q4hrs prn. Swish and spit, may swallow for sore throat.  480 mL  3  . cetirizine (ZYRTEC) 10 MG tablet Take 10 mg by mouth at bedtime as needed.       Marland Kitchen dexamethasone (DECADRON) 4 MG tablet TAKE 2 TABLETS BY MOUTH TWICE A DAY WITH A MEAL STARTING A DAY BEFORE  TAXOTERE AS DIRECTED  30 tablet  1  . dexlansoprazole (DEXILANT) 60 MG capsule Take 60 mg by mouth daily.      Marland Kitchen dihydroergotamine (MIGRANAL) 4 MG/ML nasal spray Place 1 spray into the nose as needed for migraine. Use in one nostril as directed.  No more than 4 sprays in one hour      . Eflornithine HCl (VANIQA) 13.9 % cream Apply pea-sized amount as directed  30 g  5  . ibuprofen (ADVIL,MOTRIN) 200 MG tablet Take 400 mg by mouth every 8 (eight) hours as needed. For pain      . levothyroxine (SYNTHROID, LEVOTHROID) 100 MCG tablet Take 100 mcg by mouth daily.       Marland Kitchen lidocaine-prilocaine (EMLA) cream Apply topically as needed.  30 g  6  . liothyronine (CYTOMEL) 25 MCG tablet Take 12.5 mcg by mouth 2 (two) times daily.       Marland Kitchen LORazepam (ATIVAN) 0.5 MG tablet Take 1 tablet (0.5 mg total) by mouth every 6 (six) hours as needed for anxiety.  30 tablet  0  . mometasone (NASONEX) 50 MCG/ACT nasal spray Place 2 sprays into both nostrils daily.       . Nutritional Supplements (JUICE PLUS FIBRE PO) Take 1 tablet by mouth daily.      . ondansetron (ZOFRAN) 8 MG tablet TAKE 1 TABLET BY MOUTH TWICE A DAY STARTING THE DAY AFTER CHEMO FOR 3 DAYS, THEN AS NEEDED  30 tablet  1  . potassium chloride SA (K-DUR,KLOR-CON) 20 MEQ tablet Take 1 tablet (20 mEq total) by mouth daily.  5 tablet  0  . prochlorperazine (COMPAZINE) 10 MG tablet TAKE 1 TABLET BY MOUTH EVERY 6 HOURS AS NEEDED FOR NAUSEA OR VOMITING  30 tablet  1  . ranitidine (ZANTAC) 300 MG tablet Take 300 mg by mouth at bedtime.      . topiramate (TOPAMAX) 100 MG tablet Take 200 mg by mouth at bedtime.      Marland Kitchen zolpidem (AMBIEN) 10 MG tablet Take 1 tablet (10 mg total) by mouth at bedtime as needed for sleep.  30 tablet  0  . potassium chloride SA (K-DUR,KLOR-CON) 20 MEQ tablet Take 1 tablet (20 mEq total) by mouth 2 (two) times daily.  10 tablet  0   Current Facility-Administered Medications  Medication Dose Route Frequency Provider Last Rate Last Dose  .  ondansetron (ZOFRAN) IVPB 16 mg  16 mg Intravenous Once Victorino December, MD      . sodium chloride 0.9 % 1,000 mL with potassium chloride 20 mEq infusion   Intravenous Continuous Victorino December, MD        REVIEW OF SYSTEMS:   Constitutional: Denies fevers, chills or abnormal weight loss Eyes: Denies  blurriness of vision Ears, nose, mouth, throat, and face: Denies mucositis or sore throat Respiratory: Denies cough, dyspnea or wheezes Cardiovascular: Denies palpitation, chest discomfort or lower extremity swelling Gastrointestinal:  Denies nausea, heartburn or change in bowel habits Skin: Denies abnormal skin rashes Lymphatics: Denies new lymphadenopathy or easy bruising Neurological:Denies numbness, tingling or new weaknesses Behavioral/Psych: Mood is stable, no new changes  All other systems were reviewed with the patient and are negative.  PHYSICAL EXAMINATION: ECOG PERFORMANCE STATUS: 1 - Symptomatic but completely ambulatory  Filed Vitals:   12/26/13 1235  BP: 126/79  Pulse: 114  Temp: 98.5 F (36.9 C)  Resp: 18   Filed Weights   12/26/13 1235  Weight: 201 lb 9.6 oz (91.445 kg)    GENERAL:alert, no distress and comfortable SKIN: skin color, texture, turgor are normal, no rashes or significant lesions EYES: normal, Conjunctiva are pink and non-injected, sclera clear OROPHARYNX:no exudate, no erythema and lips, buccal mucosa, and tongue normal  NECK: supple, thyroid normal size, non-tender, without nodularity LYMPH:  no palpable lymphadenopathy in the cervical, axillary or inguinal LUNGS: clear to auscultation and percussion with normal breathing effort HEART: regular rate & rhythm and no murmurs and no lower extremity edema ABDOMEN:abdomen soft, non-tender and normal bowel sounds Musculoskeletal:no cyanosis of digits and no clubbing  NEURO: alert & oriented x 3 with fluent speech, no focal motor/sensory deficits Breasts: right breast normal , mass is not palpable anymore,  skin or nipple changes or axillary nodes, left breast normal without mass, skin or nipple changes or axillary nodes.  LABORATORY DATA:  I have reviewed the data as listed    Component Value Date/Time   NA 140 12/26/2013 1225   NA 138 08/30/2013 0916   K 3.0* 12/26/2013 1225   K 4.0 08/30/2013 0916   CL 105 08/30/2013 0916   CO2 23 12/26/2013 1225   CO2 21 08/30/2013 0916   GLUCOSE 113 12/26/2013 1225   GLUCOSE 95 08/30/2013 0916   BUN 17.5 12/26/2013 1225   BUN 10 08/30/2013 0916   CREATININE 0.8 12/26/2013 1225   CREATININE 0.60 08/30/2013 0916   CALCIUM 9.1 12/26/2013 1225   CALCIUM 9.3 08/30/2013 0916   PROT 7.1 12/26/2013 1225   ALBUMIN 4.1 12/26/2013 1225   AST 21 12/26/2013 1225   ALT 22 12/26/2013 1225   ALKPHOS 174* 12/26/2013 1225   BILITOT 0.31 12/26/2013 1225   GFRNONAA >90 08/30/2013 0916   GFRAA >90 08/30/2013 0916    No results found for this basename: SPEP,  UPEP,   kappa and lambda light chains    Lab Results  Component Value Date   WBC 36.6* 12/26/2013   NEUTROABS 26.3* 12/26/2013   HGB 12.2 12/26/2013   HCT 35.7 12/26/2013   MCV 105.5* 12/26/2013   PLT 344 12/26/2013      Chemistry      Component Value Date/Time   NA 140 12/26/2013 1225   NA 138 08/30/2013 0916   K 3.0* 12/26/2013 1225   K 4.0 08/30/2013 0916   CL 105 08/30/2013 0916   CO2 23 12/26/2013 1225   CO2 21 08/30/2013 0916   BUN 17.5 12/26/2013 1225   BUN 10 08/30/2013 0916   CREATININE 0.8 12/26/2013 1225   CREATININE 0.60 08/30/2013 0916      Component Value Date/Time   CALCIUM 9.1 12/26/2013 1225   CALCIUM 9.3 08/30/2013 0916   ALKPHOS 174* 12/26/2013 1225   AST 21 12/26/2013 1225   ALT 22 12/26/2013 1225  BILITOT 0.31 12/26/2013 1225       RADIOGRAPHIC STUDIES: I have personally reviewed the radiological images as listed and agreed with the findings in the report. Mr Breast Bilateral W Wo Contrast  12/25/2013   CLINICAL DATA:  43 year old female with biopsy proven right breast invasive  ductal carcinoma and right axillary metastasis. Assess response to neoadjuvant chemotherapy. The patient underwent an MR guided core biopsy of the left breast which showed benign fibrocystic change.  LABS:  None.  EXAM: BILATERAL BREAST MRI WITH AND WITHOUT CONTRAST  TECHNIQUE: Multiplanar, multisequence MR images of both breasts were obtained prior to and following the intravenous administration of 61ml of MultiHance.  THREE-DIMENSIONAL MR IMAGE RENDERING ON INDEPENDENT WORKSTATION:  Three-dimensional MR images were rendered by post-processing of the original MR data on an independent workstation. The three-dimensional MR images were interpreted, and findings are reported in the following complete MRI report for this study. Three dimensional images were evaluated at the independent DynaCad workstation  COMPARISON:  Mammograms dated 09/12/2013, 08/16/2013 and 07/19/2013. Prior MRI dated 08/25/2013.  FINDINGS: Breast composition: b.  Scattered fibroglandular tissue.  Background parenchymal enhancement: Moderate  Right breast: Interval response to neoadjuvant chemotherapy with reduction in the size of the irregular spiculated mass in the lower outer quadrant of the right breast measuring 4.4 x 4.6 x 2.0 cm in transverse, anterior-posterior and craniocaudal dimensions. On the prior MRI dated 08/25/2013 the enhancement measured 5.6 x 8.2 x 3.1 cm. Biopsy clip is seen along the inferior aspect of the mass.  Left breast: No mass or abnormal enhancement. Biopsy clip artifact is seen in the lower outer quadrant of the left breast. The previously described linear non masslike enhancement is no longer visualized.  Lymph nodes: The previously described abnormal lymph node in the right axilla is no longer pathologically enlarged.  Ancillary findings:  None.  IMPRESSION: Interval response to neoadjuvant chemotherapy with reduction in the size of the mass in the lower outer quadrant of the right breast. Abnormal right axillary  adenopathy is no longer appreciated.  RECOMMENDATION: Continue treatment planning of the known right breast invasive ductal carcinoma and axillary metastasis.  BI-RADS CATEGORY  6: Known biopsy-proven malignancy - appropriate action should be taken.   Electronically Signed   By: Lillia Mountain M.D.   On: 12/25/2013 12:21      ASSESSMENT & PLAN:  43 year old female with  #1 stage IIIa invasive ductal carcinoma of the right breast diagnosed December 2014. Tumor was ER positive PR positive HER-2/neu positive (triple positive). Because of the extent of disease patient was recommended neoadjuvant approach to treatment. She is receiving TCH/perjeta. She has now completed 5 cycles. Overall she has tolerated her treatment well. She has one more cycle of chemotherapy remaining. She had MRI of the breasts performed at does reveal interval response to therapy.   #2 hypokalemia: Patient will proceed parenteral potassium with her IV fluids. She was also given a prescription for potassium to take at home.  #3 leukocytosis: Likely secondary to Neulasta injection. We will continue to monitor.  #4 nausea vomiting: We will give her Zofran IV.  #5 dehydration: She will receive IV fluids today.  #6 followup: Patient will be seen back in 2 weeks' time for cycle 6 of her chemotherapy. After that she will be seen by Dr. Autumn Messing for her definitive surgery.   No orders of the defined types were placed in this encounter.   All questions were answered. The patient knows to call the clinic with  any problems, questions or concerns. No barriers to learning was detected. I spent 25 minutes counseling the patient face to face. The total time spent in the appointment was 30 minutes and more than 50% was on counseling and review of test results     Deatra Robinson, MD 12/26/2013 1:52 PM

## 2014-01-02 ENCOUNTER — Other Ambulatory Visit: Payer: BC Managed Care – PPO

## 2014-01-02 ENCOUNTER — Telehealth: Payer: Self-pay | Admitting: Oncology

## 2014-01-02 NOTE — Telephone Encounter (Signed)
, °

## 2014-01-03 ENCOUNTER — Ambulatory Visit: Payer: BC Managed Care – PPO

## 2014-01-09 ENCOUNTER — Ambulatory Visit (HOSPITAL_BASED_OUTPATIENT_CLINIC_OR_DEPARTMENT_OTHER): Payer: BC Managed Care – PPO

## 2014-01-09 ENCOUNTER — Encounter: Payer: Self-pay | Admitting: Adult Health

## 2014-01-09 ENCOUNTER — Ambulatory Visit (HOSPITAL_BASED_OUTPATIENT_CLINIC_OR_DEPARTMENT_OTHER): Payer: BC Managed Care – PPO | Admitting: Adult Health

## 2014-01-09 ENCOUNTER — Other Ambulatory Visit (HOSPITAL_BASED_OUTPATIENT_CLINIC_OR_DEPARTMENT_OTHER): Payer: BC Managed Care – PPO

## 2014-01-09 ENCOUNTER — Other Ambulatory Visit: Payer: Self-pay | Admitting: Oncology

## 2014-01-09 VITALS — BP 139/85 | HR 108 | Temp 98.7°F | Resp 20 | Ht 62.0 in | Wt 203.3 lb

## 2014-01-09 DIAGNOSIS — C50519 Malignant neoplasm of lower-outer quadrant of unspecified female breast: Secondary | ICD-10-CM

## 2014-01-09 DIAGNOSIS — C773 Secondary and unspecified malignant neoplasm of axilla and upper limb lymph nodes: Secondary | ICD-10-CM

## 2014-01-09 DIAGNOSIS — R0989 Other specified symptoms and signs involving the circulatory and respiratory systems: Secondary | ICD-10-CM

## 2014-01-09 DIAGNOSIS — R002 Palpitations: Secondary | ICD-10-CM

## 2014-01-09 DIAGNOSIS — Z170 Estrogen receptor positive status [ER+]: Secondary | ICD-10-CM

## 2014-01-09 DIAGNOSIS — Z5112 Encounter for antineoplastic immunotherapy: Secondary | ICD-10-CM

## 2014-01-09 DIAGNOSIS — Z5111 Encounter for antineoplastic chemotherapy: Secondary | ICD-10-CM

## 2014-01-09 DIAGNOSIS — C50511 Malignant neoplasm of lower-outer quadrant of right female breast: Secondary | ICD-10-CM

## 2014-01-09 DIAGNOSIS — R0609 Other forms of dyspnea: Secondary | ICD-10-CM

## 2014-01-09 LAB — CBC WITH DIFFERENTIAL/PLATELET
BASO%: 0.1 % (ref 0.0–2.0)
BASOS ABS: 0 10*3/uL (ref 0.0–0.1)
EOS%: 0 % (ref 0.0–7.0)
Eosinophils Absolute: 0 10*3/uL (ref 0.0–0.5)
HEMATOCRIT: 31.9 % — AB (ref 34.8–46.6)
HGB: 10.8 g/dL — ABNORMAL LOW (ref 11.6–15.9)
LYMPH%: 12.4 % — ABNORMAL LOW (ref 14.0–49.7)
MCH: 36.6 pg — AB (ref 25.1–34.0)
MCHC: 34 g/dL (ref 31.5–36.0)
MCV: 107.7 fL — AB (ref 79.5–101.0)
MONO#: 0.4 10*3/uL (ref 0.1–0.9)
MONO%: 3.8 % (ref 0.0–14.0)
NEUT#: 9.3 10*3/uL — ABNORMAL HIGH (ref 1.5–6.5)
NEUT%: 83.7 % — AB (ref 38.4–76.8)
Platelets: 85 10*3/uL — ABNORMAL LOW (ref 145–400)
RBC: 2.96 10*6/uL — ABNORMAL LOW (ref 3.70–5.45)
RDW: 16.8 % — ABNORMAL HIGH (ref 11.2–14.5)
WBC: 11.1 10*3/uL — ABNORMAL HIGH (ref 3.9–10.3)
lymph#: 1.4 10*3/uL (ref 0.9–3.3)

## 2014-01-09 LAB — COMPREHENSIVE METABOLIC PANEL (CC13)
ALK PHOS: 83 U/L (ref 40–150)
ALT: 21 U/L (ref 0–55)
AST: 14 U/L (ref 5–34)
Albumin: 3.8 g/dL (ref 3.5–5.0)
Anion Gap: 12 mEq/L — ABNORMAL HIGH (ref 3–11)
BUN: 13.1 mg/dL (ref 7.0–26.0)
CALCIUM: 9.4 mg/dL (ref 8.4–10.4)
CO2: 20 mEq/L — ABNORMAL LOW (ref 22–29)
CREATININE: 0.7 mg/dL (ref 0.6–1.1)
Chloride: 110 mEq/L — ABNORMAL HIGH (ref 98–109)
Glucose: 175 mg/dl — ABNORMAL HIGH (ref 70–140)
Potassium: 3.5 mEq/L (ref 3.5–5.1)
Sodium: 142 mEq/L (ref 136–145)
Total Bilirubin: 0.36 mg/dL (ref 0.20–1.20)
Total Protein: 7 g/dL (ref 6.4–8.3)

## 2014-01-09 MED ORDER — DIPHENHYDRAMINE HCL 25 MG PO CAPS
50.0000 mg | ORAL_CAPSULE | Freq: Once | ORAL | Status: AC
  Administered 2014-01-09: 50 mg via ORAL

## 2014-01-09 MED ORDER — DOCETAXEL CHEMO INJECTION 160 MG/16ML
75.0000 mg/m2 | Freq: Once | INTRAVENOUS | Status: AC
  Administered 2014-01-09: 150 mg via INTRAVENOUS
  Filled 2014-01-09: qty 15

## 2014-01-09 MED ORDER — LORAZEPAM 0.5 MG PO TABS: 0.5000 mg | ORAL_TABLET | Freq: Four times a day (QID) | ORAL | Status: DC | PRN

## 2014-01-09 MED ORDER — ONDANSETRON 16 MG/50ML IVPB (CHCC)
16.0000 mg | Freq: Once | INTRAVENOUS | Status: AC
  Administered 2014-01-09: 16 mg via INTRAVENOUS

## 2014-01-09 MED ORDER — ACETAMINOPHEN 325 MG PO TABS
650.0000 mg | ORAL_TABLET | Freq: Once | ORAL | Status: AC
  Administered 2014-01-09: 650 mg via ORAL

## 2014-01-09 MED ORDER — HEPARIN SOD (PORK) LOCK FLUSH 100 UNIT/ML IV SOLN
500.0000 [IU] | Freq: Once | INTRAVENOUS | Status: AC | PRN
  Administered 2014-01-09: 500 [IU]
  Filled 2014-01-09: qty 5

## 2014-01-09 MED ORDER — DEXAMETHASONE SODIUM PHOSPHATE 20 MG/5ML IJ SOLN
20.0000 mg | Freq: Once | INTRAMUSCULAR | Status: AC
  Administered 2014-01-09: 20 mg via INTRAVENOUS

## 2014-01-09 MED ORDER — SODIUM CHLORIDE 0.9 % IV SOLN
420.0000 mg | Freq: Once | INTRAVENOUS | Status: AC
  Administered 2014-01-09: 420 mg via INTRAVENOUS
  Filled 2014-01-09: qty 14

## 2014-01-09 MED ORDER — SODIUM CHLORIDE 0.9 % IV SOLN
Freq: Once | INTRAVENOUS | Status: AC
  Administered 2014-01-09: 13:00:00 via INTRAVENOUS

## 2014-01-09 MED ORDER — TRASTUZUMAB CHEMO INJECTION 440 MG
6.0000 mg/kg | Freq: Once | INTRAVENOUS | Status: AC
  Administered 2014-01-09: 588 mg via INTRAVENOUS
  Filled 2014-01-09: qty 28

## 2014-01-09 MED ORDER — ZOLPIDEM TARTRATE 10 MG PO TABS: 10.0000 mg | ORAL_TABLET | Freq: Every evening | ORAL | Status: DC | PRN

## 2014-01-09 MED ORDER — SODIUM CHLORIDE 0.9 % IJ SOLN
10.0000 mL | INTRAMUSCULAR | Status: DC | PRN
  Administered 2014-01-09: 10 mL
  Filled 2014-01-09: qty 10

## 2014-01-09 NOTE — Progress Notes (Signed)
This RN wrote a letter for jury duty. Letter given to patient while she was seeing Mendel Ryder, NP, today. Sent a copy of the letter to be scanned with jury summons.

## 2014-01-09 NOTE — Progress Notes (Signed)
Hematology and Oncology Follow Up Visit  Joan Valdez [id number] [birth year] 43 y.o. 01/09/2014 11:52 AM     Principle Diagnosis:Joan Valdez 43 y.o. [employer] woman with clinical T3, N1, stage IIIA invasive ductal carcinoma, grade II, ER 100%, PR 100%, Ki-67 72%, HER-2/neu positive.     Prior Therapy: #1patient had a routine screening mammogram performed she was noted to have a large mass in the 6:00 position of the right breast measuring 3 cm for the primary mass with another 2 cm of calcifications anteriorly. She was also noted to have a enlarged lymph node in the right axilla. Patient underwent a needle core biopsy of the primary mass as well as axilla. The tumor was ER positive PR positive HER-2 positive with a proliferation marker Ki-67 59%. Patient had MRI of the breasts performed MRI showed right mass to measure 8.2 x 5.6 x 3.1 cm lymph node measured 2.5 cm on the left breast there was a 9 millimeter area of concern.   #2 PET/CT on 09/08/2013 was negative for metastatic disease.    #3 patient was begun on neoadjuvant chemotherapy consisting of Taxotere, carboplatin, Herceptin, and perjeta beginning on 09/19/13. Total of 6 cycles of initial therapy are planned.  #4 Patient did undergo genetic testing which was negative.   #5 Patient did undergo follow up MRI on 12/25/13 to evaluate her response to neoadjuvant chemotherapy.  It demonstrated an interval response to treatment with a reduction in size from 5.6 x 8.2 x 3.1 cm to 4.4 x 4.6 x 2 cm.    Current therapy:  Taxotere, Carboplatin, Herceptin and Perjeta cycle 6 day 1  Interim History: Joan Valdez 43 y.o. 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  She is doing very well today.  She is accompanied by her husband today.  She denies fevers, chills, nausea, vomiting, constipation, diarrhea, any peripheral neuropathy, nail changes, mouth pain, decreased appetite, pain, or any further  concerns.  She is ready to proceed with her final cycle of chemotherapy.    Medications:  Current Outpatient Prescriptions  Medication Sig Dispense Refill  . acetaminophen (TYLENOL) 500 MG tablet Take 500 mg by mouth every 6 (six) hours as needed for mild pain.      Marland Kitchen dexamethasone (DECADRON) 4 MG tablet TAKE 2 TABLETS BY MOUTH TWICE A DAY WITH A MEAL STARTING A DAY BEFORE TAXOTERE AS DIRECTED  30 tablet  1  . dexlansoprazole (DEXILANT) 60 MG capsule Take 60 mg by mouth daily.      . Eflornithine HCl (VANIQA) 13.9 % cream Apply pea-sized amount as directed  30 g  5  . levothyroxine (SYNTHROID, LEVOTHROID) 100 MCG tablet Take 100 mcg by mouth daily.       Marland Kitchen lidocaine-prilocaine (EMLA) cream Apply topically as needed.  30 g  6  . liothyronine (CYTOMEL) 25 MCG tablet Take 12.5 mcg by mouth 2 (two) times daily.       Marland Kitchen loratadine (CLARITIN) 10 MG tablet Take 10 mg by mouth daily as needed for allergies. Pt takes claritin a few days before injection, then a few days afterwards.      Marland Kitchen LORazepam (ATIVAN) 0.5 MG tablet Take 1 tablet (0.5 mg total) by mouth every 6 (six) hours as needed for anxiety.  30 tablet  0  . Nutritional Supplements (JUICE PLUS FIBRE PO) Take 1 tablet by mouth daily.      . ranitidine (ZANTAC) 300 MG tablet Take 300 mg by mouth at  bedtime.      . topiramate (TOPAMAX) 100 MG tablet Take 200 mg by mouth at bedtime.      Marland Kitchen zolpidem (AMBIEN) 10 MG tablet Take 1 tablet (10 mg total) by mouth at bedtime as needed for sleep.  30 tablet  0  . Alum & Mag Hydroxide-Simeth (MAGIC MOUTHWASH W/LIDOCAINE) SOLN 5-70m q4hrs prn. Swish and spit, may swallow for sore throat.  480 mL  3  . cetirizine (ZYRTEC) 10 MG tablet Take 10 mg by mouth at bedtime as needed.       . dihydroergotamine (MIGRANAL) 4 MG/ML nasal spray Place 1 spray into the nose as needed for migraine. Use in one nostril as directed.  No more than 4 sprays in one hour      . ibuprofen (ADVIL,MOTRIN) 200 MG tablet Take 400 mg by  mouth every 8 (eight) hours as needed. For pain      . mometasone (NASONEX) 50 MCG/ACT nasal spray Place 2 sprays into both nostrils as needed.       . ondansetron (ZOFRAN) 8 MG tablet TAKE 1 TABLET BY MOUTH TWICE A DAY STARTING THE DAY AFTER CHEMO FOR 3 DAYS, THEN AS NEEDED  30 tablet  1  . potassium chloride SA (K-DUR,KLOR-CON) 20 MEQ tablet Take 1 tablet (20 mEq total) by mouth 2 (two) times daily.  10 tablet  0  . prochlorperazine (COMPAZINE) 10 MG tablet TAKE 1 TABLET BY MOUTH EVERY 6 HOURS AS NEEDED FOR NAUSEA OR VOMITING  30 tablet  1   No current facility-administered medications for this visit.     Allergies:  Allergies  Allergen Reactions  . Imitrex [Sumatriptan Base] Anaphylaxis  . Amoxicillin Hives  . Bactrim Hives  . Cephalexin Hives  . Erythromycin Hives  . Penicillins Hives  . Strawberry Hives  . Sulfa Antibiotics Hives  . Zithromax [Azithromycin Dihydrate] Hives    Medical History: Past Medical History  Diagnosis Date  . PCO (polycystic ovaries)   . Hypothyroid   . Allergic rhinitis   . Chronic headache   . Acid reflux   . Cervical dysplasia   . Stone, kidney     Hx: of  . Cancer     ;Breast cancer; pre-cancerous cervical cells  . 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 Cesarean section  2001  . Colposcopy    . Cervical biopsy  w/ loop electrode excision  2002  . Eye surgery      Hx: of metal fragment removed  . Portacath placement N/A 09/04/2013    Procedure: INSERTION PORT-A-CATH;  Surgeon: PMerrie Roof MD;  [facility] [facility]  Service: General;  Laterality: N/A;     Review of Systems: A 10 point review of systems was conducted and is otherwise negative except for what is noted above.     Physical Exam: Blood pressure 139/85, pulse 108, temperature 98.7 F (37.1 C), temperature source Oral, resp. rate 20, height _0  (1.575 m), weight 203 lb 4.8 oz  (92.216 kg). GENERAL: Patient is a well appearing female in no acute distress HEENT:  Sclerae anicteric.  Oropharynx clear and moist. No ulcerations or evidence of oropharyngeal candidiasis. Neck is supple.  NODES:  No cervical, supraclavicular, or axillary lymphadenopathy palpated.  BREAST EXAM:  Deferred. LUNGS:  Clear to auscultation bilaterally.  No wheezes or rhonchi. HEART:  Regular rate and rhythm. No murmur appreciated.  ABDOMEN:  Soft, nontender.  Positive, normoactive bowel sounds. No organomegaly palpated. MSK:  No focal spinal tenderness to palpation. Full range of motion bilaterally in the upper extremities. EXTREMITIES:  No peripheral edema.   SKIN:  Clear with no obvious rashes or skin changes. No nail dyscrasia. NEURO:  Nonfocal. Well oriented.  Appropriate affect. ECOG PERFORMANCE STATUS: 0 - Asymptomatic   Lab Results: Lab Results  Component Value Date   WBC 11.1* 01/09/2014   HGB 10.8* 01/09/2014   HCT 31.9* 01/09/2014   MCV 107.7* 01/09/2014   PLT 85* 01/09/2014     Chemistry      Component Value Date/Time   NA 142 01/09/2014 1041   NA 138 08/30/2013 0916   K 3.5 01/09/2014 1041   K 4.0 08/30/2013 0916   CL 105 08/30/2013 0916   CO2 20* 01/09/2014 1041   CO2 21 08/30/2013 0916   BUN 13.1 01/09/2014 1041   BUN 10 08/30/2013 0916   CREATININE 0.7 01/09/2014 1041   CREATININE 0.60 08/30/2013 0916      Component Value Date/Time   CALCIUM 9.4 01/09/2014 1041   CALCIUM 9.3 08/30/2013 0916   ALKPHOS 83 01/09/2014 1041   AST 14 01/09/2014 1041   ALT 21 01/09/2014 1041   BILITOT 0.36 01/09/2014 1041       Radiological Studies: CLINICAL DATA: 43 year old female with biopsy proven right breast  invasive ductal carcinoma and right axillary metastasis. Assess  response to neoadjuvant chemotherapy. The patient underwent an MR  guided core biopsy of the left breast which showed benign  fibrocystic change.  LABS: None.  EXAM:  BILATERAL BREAST MRI WITH AND WITHOUT  CONTRAST  TECHNIQUE:  Multiplanar, multisequence MR images of both breasts were obtained  prior to and following the intravenous administration of 27m of  MultiHance.  THREE-DIMENSIONAL MR IMAGE RENDERING ON INDEPENDENT WORKSTATION:  Three-dimensional MR images were rendered by post-processing of the  original MR data on an independent workstation. The  three-dimensional MR images were interpreted, and findings are  reported in the following complete MRI report for this study. Three  dimensional images were evaluated at the independent DynaCad  workstation  COMPARISON: Mammograms dated 09/12/2013, 08/16/2013 and 07/19/2013.  Prior MRI dated 08/25/2013.  FINDINGS:  Breast composition: b. Scattered fibroglandular tissue.  Background parenchymal enhancement: Moderate  Right breast: Interval response to neoadjuvant chemotherapy with  reduction in the size of the irregular spiculated mass in the lower  outer quadrant of the right breast measuring 4.4 x 4.6 x 2.0 cm in  transverse, anterior-posterior and craniocaudal dimensions. On the  prior MRI dated 08/25/2013 the enhancement measured 5.6 x 8.2 x 3.1  cm. Biopsy clip is seen along the inferior aspect of the mass.  Left breast: No mass or abnormal enhancement. Biopsy clip artifact  is seen in the lower outer quadrant of the left breast. The  previously described linear non masslike enhancement is no longer  visualized.  Lymph nodes: The previously described abnormal lymph node in the  right axilla is no longer pathologically enlarged.  Ancillary findings: None.  IMPRESSION:  Interval response to neoadjuvant chemotherapy with reduction in the  size of the mass in the lower outer quadrant of the right breast.  Abnormal right axillary adenopathy is no longer appreciated.  RECOMMENDATION:  Continue treatment planning of the known right breast invasive  ductal carcinoma and axillary metastasis.  BI-RADS CATEGORY 6: Known biopsy-proven  malignancy - appropriate  action should be taken.  Electronically Signed  By: DMordecai Rasmussen  Arceo M.D.  On: 12/25/2013 12:21   Assessment and Plan: Joan Valdez 43 y.o. female with  1. Stage IIIA triple positive invasive ductal carcinoma of the right breast.  The patient is doing very well today.  We again reviewed the results from her MRI.  Unfortunately, the patient's plt count is 85K today.  I discussed this with Dr. Jana Hakim and the patient will only receive Taxotere, Herceptin and Perjeta with her sixth and final cycle of treatment.  I did discuss her upcoming Herceptin plan following her neoadjuvant treatment with her in detail.    2. Cardiac:  The patient last underwent an echocardiogram on 12/05/13 that demonstrated a LVEF of 65-70% She was evaluated in the cardio-onc clinic by Dr. Aundra Dubin and he cleared her to continue with Herceptin therapy, but recommended 6 week f/u with echocardiogram.  This is scheduled on 01/22/14.  He also recommended that the patient walk daily for exercise due to exertional dyspnea and palpitations he proposed is related to deconditioning.    3. Patient receives Zoladex every four weeks.  She is next due on 01/23/14 for this.    The patient will return tomorrow for Neulasta, and in one week for labs and evaluation of chemotoxicities.  She has f/u with Dr. Marlou Starks on 01/16/14 and surgery is scheduled on 02/01/14.   She and her husband know to call us in the interim for any questions or concerns.  We can certainly see her sooner if needed.  I spent 25 minutes counseling the patient face to face.  The total time spent in the appointment was 30 minutes.  Minette Headland, [place] [phone] 01/09/2014 11:52 AM

## 2014-01-10 ENCOUNTER — Telehealth: Payer: Self-pay | Admitting: Oncology

## 2014-01-10 ENCOUNTER — Ambulatory Visit (HOSPITAL_BASED_OUTPATIENT_CLINIC_OR_DEPARTMENT_OTHER): Payer: BC Managed Care – PPO

## 2014-01-10 VITALS — BP 111/71 | HR 110 | Temp 98.3°F

## 2014-01-10 DIAGNOSIS — Z5189 Encounter for other specified aftercare: Secondary | ICD-10-CM

## 2014-01-10 DIAGNOSIS — C50511 Malignant neoplasm of lower-outer quadrant of right female breast: Secondary | ICD-10-CM

## 2014-01-10 DIAGNOSIS — C50519 Malignant neoplasm of lower-outer quadrant of unspecified female breast: Secondary | ICD-10-CM

## 2014-01-10 MED ORDER — PEGFILGRASTIM INJECTION 6 MG/0.6ML
6.0000 mg | Freq: Once | SUBCUTANEOUS | Status: AC
  Administered 2014-01-10: 6 mg via SUBCUTANEOUS
  Filled 2014-01-10: qty 0.6

## 2014-01-10 NOTE — Telephone Encounter (Signed)
kk out pt to see lc 5/5. lmonvm for pt and mailed scheduled.

## 2014-01-11 ENCOUNTER — Telehealth: Payer: Self-pay | Admitting: *Deleted

## 2014-01-11 NOTE — Telephone Encounter (Signed)
Confirmed new appt with Mendel Ryder on 01/16/14 at 1115/1145.  Scheduled pt for Zoladex injection on 01/23/14 at 1245. Confirmed date and time.

## 2014-01-13 ENCOUNTER — Telehealth: Payer: Self-pay | Admitting: Adult Health

## 2014-01-14 IMAGING — CR DG CHEST 1V PORT
1 series · 1 of 1 positions shown · non-contrast
Comparison: Portable exam 8227 hr compared to 12/12/2012

CLINICAL DATA: Port-A-Cath placement

EXAM:
PORTABLE CHEST - 1 VIEW

[AP]
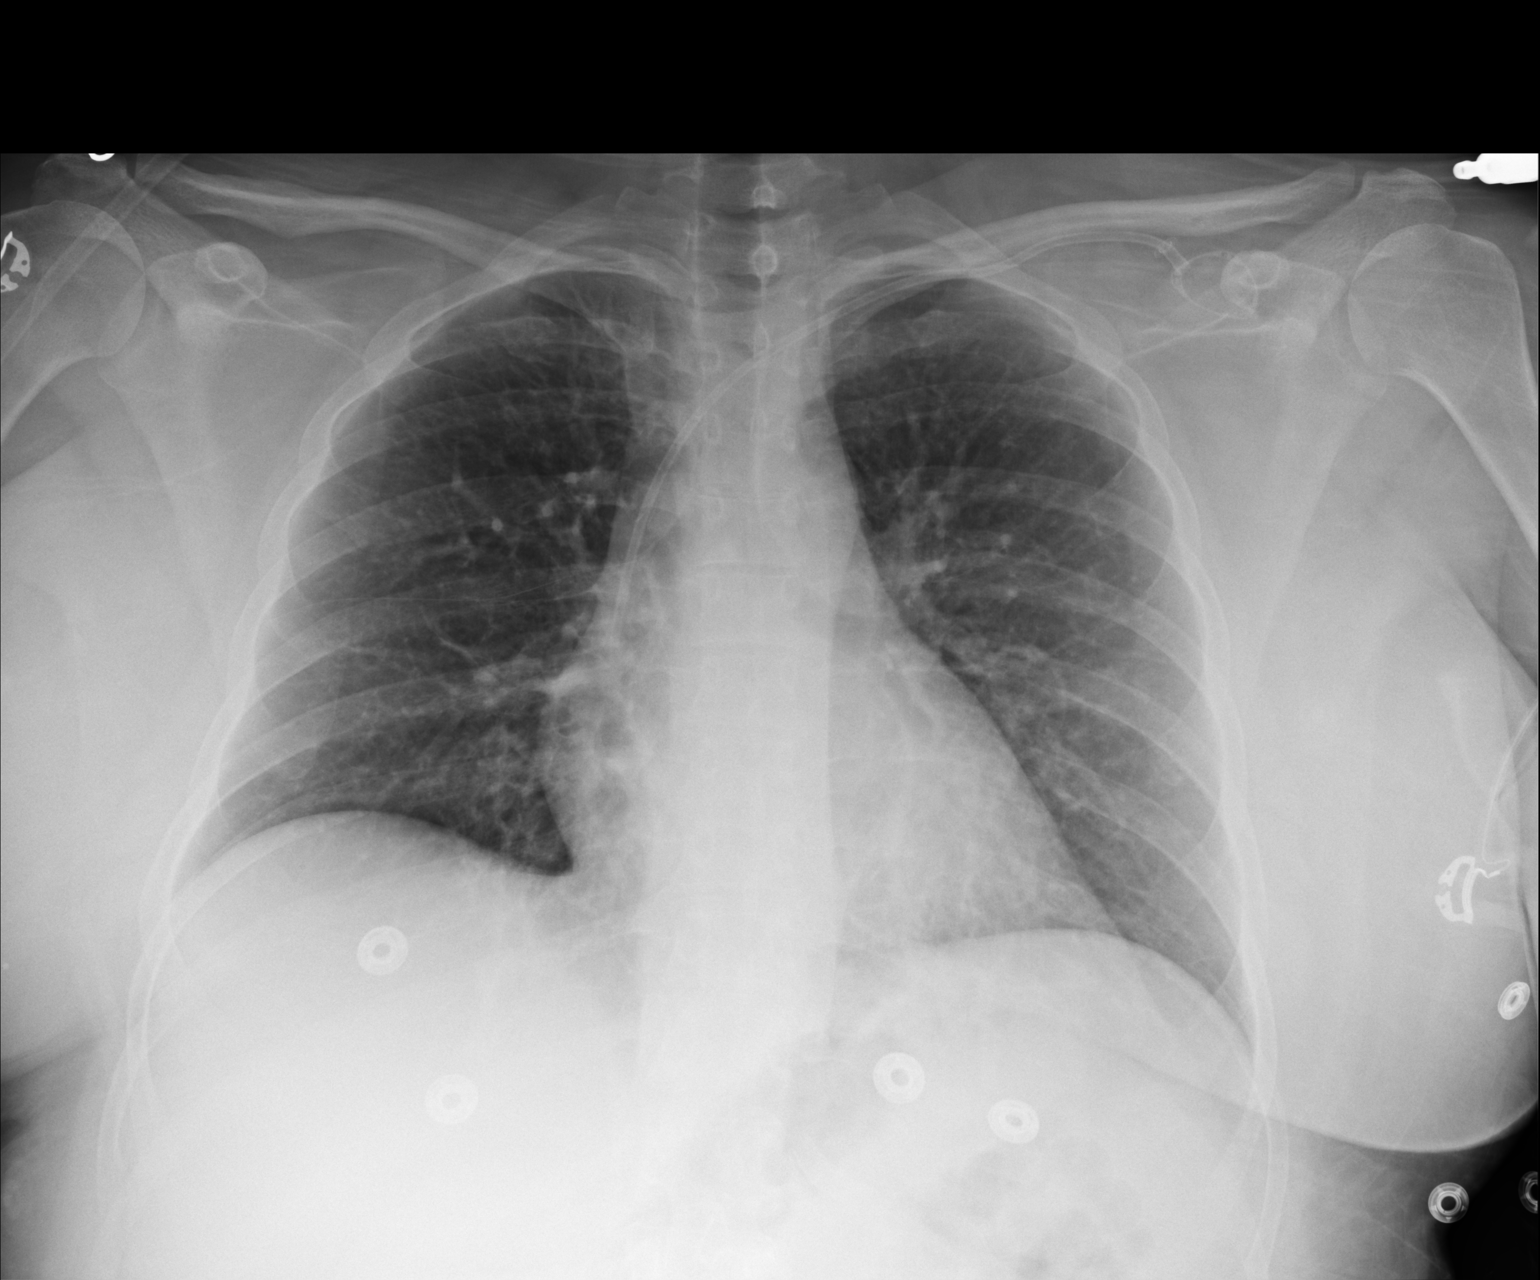

[1 of 1 positions shown; findings below may reference images not displayed]

FINDINGS: Left subclavian Port-A-Cath with tip projecting over mid SVC.

Normal heart size, mediastinal contours and pulmonary vascularity
for technique.

Lungs clear.

No pleural effusion or pneumothorax.

Bones unremarkable.
IMPRESSION: No acute abnormalities.

## 2014-01-16 ENCOUNTER — Encounter: Payer: Self-pay | Admitting: Adult Health

## 2014-01-16 ENCOUNTER — Ambulatory Visit (HOSPITAL_BASED_OUTPATIENT_CLINIC_OR_DEPARTMENT_OTHER): Payer: BC Managed Care – PPO | Admitting: Adult Health

## 2014-01-16 ENCOUNTER — Telehealth: Payer: Self-pay | Admitting: Internal Medicine

## 2014-01-16 ENCOUNTER — Other Ambulatory Visit (HOSPITAL_BASED_OUTPATIENT_CLINIC_OR_DEPARTMENT_OTHER): Payer: BC Managed Care – PPO

## 2014-01-16 ENCOUNTER — Ambulatory Visit: Payer: BC Managed Care – PPO

## 2014-01-16 VITALS — BP 129/92 | HR 121 | Temp 98.9°F | Resp 18 | Ht 62.0 in | Wt 198.7 lb

## 2014-01-16 DIAGNOSIS — C50519 Malignant neoplasm of lower-outer quadrant of unspecified female breast: Secondary | ICD-10-CM

## 2014-01-16 DIAGNOSIS — C50511 Malignant neoplasm of lower-outer quadrant of right female breast: Secondary | ICD-10-CM

## 2014-01-16 DIAGNOSIS — Z170 Estrogen receptor positive status [ER+]: Secondary | ICD-10-CM

## 2014-01-16 DIAGNOSIS — E876 Hypokalemia: Secondary | ICD-10-CM

## 2014-01-16 LAB — CBC WITH DIFFERENTIAL/PLATELET
BASO%: 0.3 % (ref 0.0–2.0)
Basophils Absolute: 0 10*3/uL (ref 0.0–0.1)
EOS ABS: 0 10*3/uL (ref 0.0–0.5)
EOS%: 0.2 % (ref 0.0–7.0)
HCT: 35.6 % (ref 34.8–46.6)
HGB: 12.2 g/dL (ref 11.6–15.9)
LYMPH%: 35.6 % (ref 14.0–49.7)
MCH: 36.6 pg — ABNORMAL HIGH (ref 25.1–34.0)
MCHC: 34.1 g/dL (ref 31.5–36.0)
MCV: 107.3 fL — ABNORMAL HIGH (ref 79.5–101.0)
MONO#: 2.8 10*3/uL — AB (ref 0.1–0.9)
MONO%: 15.2 % — ABNORMAL HIGH (ref 0.0–14.0)
NEUT%: 48.7 % (ref 38.4–76.8)
NEUTROS ABS: 9 10*3/uL — AB (ref 1.5–6.5)
PLATELETS: 353 10*3/uL (ref 145–400)
RBC: 3.32 10*6/uL — ABNORMAL LOW (ref 3.70–5.45)
RDW: 15.6 % — ABNORMAL HIGH (ref 11.2–14.5)
WBC: 18.5 10*3/uL — ABNORMAL HIGH (ref 3.9–10.3)
lymph#: 6.6 10*3/uL — ABNORMAL HIGH (ref 0.9–3.3)

## 2014-01-16 LAB — COMPREHENSIVE METABOLIC PANEL (CC13)
ALBUMIN: 3.6 g/dL (ref 3.5–5.0)
ALT: 21 U/L (ref 0–55)
ANION GAP: 13 meq/L — AB (ref 3–11)
AST: 24 U/L (ref 5–34)
Alkaline Phosphatase: 101 U/L (ref 40–150)
BILIRUBIN TOTAL: 0.36 mg/dL (ref 0.20–1.20)
BUN: 18.2 mg/dL (ref 7.0–26.0)
CALCIUM: 9.5 mg/dL (ref 8.4–10.4)
CO2: 25 meq/L (ref 22–29)
Chloride: 104 mEq/L (ref 98–109)
Creatinine: 0.9 mg/dL (ref 0.6–1.1)
GLUCOSE: 97 mg/dL (ref 70–140)
POTASSIUM: 3.1 meq/L — AB (ref 3.5–5.1)
Sodium: 142 mEq/L (ref 136–145)
TOTAL PROTEIN: 6.2 g/dL — AB (ref 6.4–8.3)

## 2014-01-16 NOTE — Progress Notes (Signed)
Hematology and Oncology Follow Up Visit  Joan Valdez [id number] 05/25/1971 42 y.o. 01/17/2014 5:14 PM     Principle Diagnosis:Joan Valdez 42 y.o. [place] woman with clinical T3, N1, stage IIIA invasive ductal carcinoma, grade II, ER 100%, PR 100%, Ki-67 72%, HER-2/neu positive.     Prior Therapy: #1patient had a routine screening mammogram performed she was noted to have a large mass in the 6:00 position of the right breast measuring 3 cm for the primary mass with another 2 cm of calcifications anteriorly. She was also noted to have a enlarged lymph node in the right axilla. Patient underwent a needle core biopsy of the primary mass as well as axilla. The tumor was ER positive PR positive HER-2 positive with a proliferation marker Ki-67 59%. Patient had MRI of the breasts performed MRI showed right mass to measure 8.2 x 5.6 x 3.1 cm lymph node measured 2.5 cm on the left breast there was a 9 millimeter area of concern.   #2 PET/CT on 09/08/2013 was negative for metastatic disease.    #3 patient was begun on neoadjuvant chemotherapy consisting of Taxotere, carboplatin, Herceptin, and perjeta beginning on 09/19/13. Total of 6 cycles of initial therapy are planned.  #4 Patient did undergo genetic testing which was negative.   #5 Patient did undergo follow up MRI on 12/25/13 to evaluate her response to neoadjuvant chemotherapy.  It demonstrated an interval response to treatment with a reduction in size from 5.6 x 8.2 x 3.1 cm to 4.4 x 4.6 x 2 cm.    Current therapy:  Taxotere, Carboplatin, Herceptin and Perjeta cycle 6 day 8  Interim History: Joan Valdez 42 y.o. 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  She is here for evaluation following her 6th and final cycle of Taxotere, Carboplatin, Herceptin, and Perjeta.  She is doing well.  She is slightly anxious today due to her upcoming surgery.  She denies fevers, chills, nausea, vomiting,  constipation, diarrhea, numbness/tingling, nail changes, or any further concerns.    Medications:  Current Outpatient Prescriptions  Medication Sig Dispense Refill  . dexlansoprazole (DEXILANT) 60 MG capsule Take 60 mg by mouth daily.      . Eflornithine HCl (VANIQA) 13.9 % cream Apply pea-sized amount as directed  30 g  5  . levothyroxine (SYNTHROID, LEVOTHROID) 100 MCG tablet Take 100 mcg by mouth daily.       . liothyronine (CYTOMEL) 25 MCG tablet Take 12.5 mcg by mouth 2 (two) times daily.       . loratadine (CLARITIN) 10 MG tablet Take 10 mg by mouth daily as needed for allergies. Pt takes claritin a few days before injection, then a few days afterwards.      . ranitidine (ZANTAC) 300 MG tablet Take 300 mg by mouth at bedtime.      . topiramate (TOPAMAX) 100 MG tablet Take 200 mg by mouth at bedtime.      . zolpidem (AMBIEN) 10 MG tablet Take 1 tablet (10 mg total) by mouth at bedtime as needed for sleep.  30 tablet  0  . acetaminophen (TYLENOL) 500 MG tablet Take 500 mg by mouth every 6 (six) hours as needed for mild pain.      . Alum & Mag Hydroxide-Simeth (MAGIC MOUTHWASH W/LIDOCAINE) SOLN 5-10ml q4hrs prn. Swish and spit, may swallow for sore throat.  480 mL  3  . cetirizine (ZYRTEC) 10 MG tablet Take 10 mg by mouth at bedtime as   needed.       . dexamethasone (DECADRON) 4 MG tablet TAKE 2 TABLETS BY MOUTH TWICE A DAY WITH A MEAL STARTING A DAY BEFORE TAXOTERE AS DIRECTED  30 tablet  1  . dihydroergotamine (MIGRANAL) 4 MG/ML nasal spray Place 1 spray into the nose as needed for migraine. Use in one nostril as directed.  No more than 4 sprays in one hour      . ibuprofen (ADVIL,MOTRIN) 200 MG tablet Take 400 mg by mouth every 8 (eight) hours as needed. For pain      . lidocaine-prilocaine (EMLA) cream Apply topically as needed.  30 g  6  . LORazepam (ATIVAN) 0.5 MG tablet Take 1 tablet (0.5 mg total) by mouth every 6 (six) hours as needed for anxiety.  30 tablet  0  . mometasone (NASONEX)  50 MCG/ACT nasal spray Place 2 sprays into both nostrils as needed.       . Nutritional Supplements (JUICE PLUS FIBRE PO) Take 1 tablet by mouth daily.      . ondansetron (ZOFRAN) 8 MG tablet TAKE 1 TABLET BY MOUTH TWICE A DAY STARTING THE DAY AFTER CHEMO FOR 3 DAYS, THEN AS NEEDED  30 tablet  1  . oxyCODONE-acetaminophen (PERCOCET/ROXICET) 5-325 MG per tablet       . potassium chloride SA (K-DUR,KLOR-CON) 20 MEQ tablet Take 1 tablet (20 mEq total) by mouth 2 (two) times daily.  10 tablet  0  . prochlorperazine (COMPAZINE) 10 MG tablet TAKE 1 TABLET BY MOUTH EVERY 6 HOURS AS NEEDED FOR NAUSEA OR VOMITING  30 tablet  1   No current facility-administered medications for this visit.     Allergies:  Allergies  Allergen Reactions  . Imitrex [Sumatriptan Base] Anaphylaxis  . Amoxicillin Hives  . Bactrim Hives  . Cephalexin Hives  . Erythromycin Hives  . Penicillins Hives  . Strawberry Hives  . Sulfa Antibiotics Hives  . Zithromax [Azithromycin Dihydrate] Hives    Medical History: Past Medical History  Diagnosis Date  . PCO (polycystic ovaries)   . Hypothyroid   . Allergic rhinitis   . Chronic headache   . Acid reflux   . Cervical dysplasia   . Stone, kidney     Hx: of  . Cancer     ;Breast cancer; pre-cancerous cervical cells  . 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 Cesarean section  2001  . Colposcopy    . Cervical biopsy  w/ loop electrode excision  2002  . Eye surgery      Hx: of metal fragment removed  . Portacath placement N/A 09/04/2013    Procedure: INSERTION PORT-A-CATH;  Surgeon: Paul S Toth III, MD;  Location: [facility];  Service: General;  Laterality: N/A;     Review of Systems: A 10 point review of systems was conducted and is otherwise negative except for what is noted above.     Physical Exam: Blood pressure 129/92, pulse 121, temperature 98.9 F (37.2 C),  temperature source Oral, resp. rate 18, height 5' 2" (1.575 m), weight 198 lb 11.2 oz (90.13 kg). GENERAL: Patient is a well appearing female in no acute distress HEENT:  Sclerae anicteric.  Oropharynx clear and moist. No ulcerations or evidence of oropharyngeal candidiasis. Neck is supple.  NODES:  No cervical, supraclavicular, or axillary lymphadenopathy palpated.  BREAST EXAM:  Deferred. LUNGS:  Clear to auscultation bilaterally.    No wheezes or rhonchi. HEART:  Regular rate and rhythm. No murmur appreciated. ABDOMEN:  Soft, nontender.  Positive, normoactive bowel sounds. No organomegaly palpated. MSK:  No focal spinal tenderness to palpation. Full range of motion bilaterally in the upper extremities. EXTREMITIES:  No peripheral edema.   SKIN:  Clear with no obvious rashes or skin changes. No nail dyscrasia. NEURO:  Nonfocal. Well oriented.  Appropriate affect. ECOG PERFORMANCE STATUS: 0 - Asymptomatic   Lab Results: Lab Results  Component Value Date   WBC 18.5* 01/16/2014   HGB 12.2 01/16/2014   HCT 35.6 01/16/2014   MCV 107.3* 01/16/2014   PLT 353 01/16/2014     Chemistry      Component Value Date/Time   NA 142 01/16/2014 1056   NA 138 08/30/2013 0916   K 3.1* 01/16/2014 1056   K 4.0 08/30/2013 0916   CL 105 08/30/2013 0916   CO2 25 01/16/2014 1056   CO2 21 08/30/2013 0916   BUN 18.2 01/16/2014 1056   BUN 10 08/30/2013 0916   CREATININE 0.9 01/16/2014 1056   CREATININE 0.60 08/30/2013 0916      Component Value Date/Time   CALCIUM 9.5 01/16/2014 1056   CALCIUM 9.3 08/30/2013 0916   ALKPHOS 101 01/16/2014 1056   AST 24 01/16/2014 1056   ALT 21 01/16/2014 1056   BILITOT 0.36 01/16/2014 1056       Radiological Studies: CLINICAL DATA: 43 year old female with biopsy proven right breast  invasive ductal carcinoma and right axillary metastasis. Assess  response to neoadjuvant chemotherapy. The patient underwent an MR  guided core biopsy of the left breast which showed benign  fibrocystic change.   LABS: None.  EXAM:  BILATERAL BREAST MRI WITH AND WITHOUT CONTRAST  TECHNIQUE:  Multiplanar, multisequence MR images of both breasts were obtained  prior to and following the intravenous administration of 84m of  MultiHance.  THREE-DIMENSIONAL MR IMAGE RENDERING ON INDEPENDENT WORKSTATION:  Three-dimensional MR images were rendered by post-processing of the  original MR data on an independent workstation. The  three-dimensional MR images were interpreted, and findings are  reported in the following complete MRI report for this study. Three  dimensional images were evaluated at the independent DynaCad  workstation  COMPARISON: Mammograms dated 09/12/2013, 08/16/2013 and 07/19/2013.  Prior MRI dated 08/25/2013.  FINDINGS:  Breast composition: b. Scattered fibroglandular tissue.  Background parenchymal enhancement: Moderate  Right breast: Interval response to neoadjuvant chemotherapy with  reduction in the size of the irregular spiculated mass in the lower  outer quadrant of the right breast measuring 4.4 x 4.6 x 2.0 cm in  transverse, anterior-posterior and craniocaudal dimensions. On the  prior MRI dated 08/25/2013 the enhancement measured 5.6 x 8.2 x 3.1  cm. Biopsy clip is seen along the inferior aspect of the mass.  Left breast: No mass or abnormal enhancement. Biopsy clip artifact  is seen in the lower outer quadrant of the left breast. The  previously described linear non masslike enhancement is no longer  visualized.  Lymph nodes: The previously described abnormal lymph node in the  right axilla is no longer pathologically enlarged.  Ancillary findings: None.  IMPRESSION:  Interval response to neoadjuvant chemotherapy with reduction in the  size of the mass in the lower outer quadrant of the right breast.  Abnormal right axillary adenopathy is no longer appreciated.  RECOMMENDATION:  Continue treatment planning of the known right breast invasive  ductal carcinoma and  axillary metastasis.  BI-RADS CATEGORY 6: Known biopsy-proven malignancy -  appropriate  action should be taken.  Electronically Signed  By: Dina Arceo M.D.  On: 12/25/2013 12:21   Assessment and Plan: Joan Valdez 42 y.o. female with  1. Stage IIIA triple positive invasive ductal carcinoma of the right breast.  The patient is doing very well today.  I reviewed her labs with her today.  They are stable.  She has completed Taxotere, carboplatin, herceptin, and Perjeta relatively well.    2. Cardiac:  The patient last underwent an echocardiogram on 12/05/13 that demonstrated a LVEF of 65-70% She was evaluated in the cardio-onc clinic by Dr. McLean and he cleared her to continue with Herceptin therapy, but recommended 6 week f/u with echocardiogram.  This is scheduled on 01/22/14.  He also recommended that the patient walk daily for exercise due to exertional dyspnea and palpitations he proposed is related to deconditioning.    3. Patient receives Zoladex every four weeks.  She is next due on 01/23/14 for this.    4. Hypokalemia.  Her potassium is 3.1 today.  I prescribed kdur for her to take today.    The patient will return on 01/30/14 for labs and herceptin, and on 02/20/14 with Dr. Magrinat for labs, surgical follow up and Herceptin. She has f/u with Dr. Toth on 01/16/14 and surgery is scheduled on 02/01/14.   She and her husband know to call us in the interim for any questions or concerns.  We can certainly see her sooner if needed.  I spent 25 minutes counseling the patient face to face.  The total time spent in the appointment was 30 minutes.  Lindsey N. Cornetto, NP Medical Oncology [facility] [phone] 01/17/2014 5:14 PM 

## 2014-01-16 NOTE — Telephone Encounter (Signed)
Joan Valdez called again and wants to talk to someone regarding this rx that was called in.

## 2014-01-17 MED ORDER — POTASSIUM CHLORIDE CRYS ER 20 MEQ PO TBCR: 20.0000 meq | EXTENDED_RELEASE_TABLET | Freq: Two times a day (BID) | ORAL | Status: DC

## 2014-01-18 ENCOUNTER — Ambulatory Visit: Payer: BC Managed Care – PPO

## 2014-01-19 ENCOUNTER — Encounter (INDEPENDENT_AMBULATORY_CARE_PROVIDER_SITE_OTHER): Payer: Self-pay | Admitting: General Surgery

## 2014-01-19 ENCOUNTER — Ambulatory Visit (INDEPENDENT_AMBULATORY_CARE_PROVIDER_SITE_OTHER): Payer: BC Managed Care – PPO | Admitting: General Surgery

## 2014-01-19 ENCOUNTER — Encounter (HOSPITAL_COMMUNITY): Payer: Self-pay

## 2014-01-19 VITALS — BP 116/70 | HR 70 | Temp 98.0°F | Resp 14 | Wt 202.8 lb

## 2014-01-19 DIAGNOSIS — C50511 Malignant neoplasm of lower-outer quadrant of right female breast: Secondary | ICD-10-CM

## 2014-01-19 DIAGNOSIS — C50519 Malignant neoplasm of lower-outer quadrant of unspecified female breast: Secondary | ICD-10-CM

## 2014-01-19 NOTE — Progress Notes (Signed)
Subjective:     Patient ID: Joan Valdez, female   DOB: Apr 19, 1971, 43 y.o.   MRN: [id number]  HPI The patient is a 43 year old white female with a locally advanced right breast cancer. She finished her chemotherapy on April 28th. She is a triple positive. She is now ready for bilateral mastectomies and right axillary lymph node dissection with left-sided reconstruction by Dr. Harlow Mares. This is scheduled for May 21.  Review of Systems  Constitutional: Negative.   HENT: Negative.   Eyes: Negative.   Respiratory: Negative.   Cardiovascular: Negative.   Gastrointestinal: Negative.   Endocrine: Negative.   Genitourinary: Negative.   Musculoskeletal: Negative.   Skin: Negative.   Allergic/Immunologic: Negative.   Neurological: Negative.   Hematological: Negative.   Psychiatric/Behavioral: Negative.        Objective:   Physical Exam  Constitutional: She is oriented to person, place, and time. She appears well-developed and well-nourished.  HENT:  Head: Normocephalic and atraumatic.  Eyes: Conjunctivae and EOM are normal. Pupils are equal, round, and reactive to light.  Neck: Normal range of motion. Neck supple.  Cardiovascular: Normal rate, regular rhythm and normal heart sounds.   Pulmonary/Chest: Effort normal and breath sounds normal.  There is no palpable mass in either breast. There is no palpable axillary, supraclavicular, or cervical lymphadenopathy.  Abdominal: Soft. Bowel sounds are normal.  Musculoskeletal: Normal range of motion.  Lymphadenopathy:    She has no cervical adenopathy.  Neurological: She is alert and oriented to person, place, and time.  Skin: Skin is warm and dry.  Psychiatric: She has a normal mood and affect. Her behavior is normal.       Assessment:     The patient has a locally advanced right breast cancer that has responded well to neoadjuvant chemotherapy     Plan:     Plan for bilateral mastectomies and right axillary lymph node dissection  with left-sided reconstruction by Dr. Harlow Mares on May 21. All questions were answered for her today. She is excited to get over to surgery

## 2014-01-22 ENCOUNTER — Encounter (HOSPITAL_COMMUNITY): Payer: Self-pay

## 2014-01-22 ENCOUNTER — Ambulatory Visit (HOSPITAL_BASED_OUTPATIENT_CLINIC_OR_DEPARTMENT_OTHER)
Admission: RE | Admit: 2014-01-22 | Discharge: 2014-01-22 | Disposition: A | Discharge status: Home / Self Care | Payer: BC Managed Care – PPO | Source: Ambulatory Visit | Attending: Internal Medicine | Admitting: Internal Medicine

## 2014-01-22 ENCOUNTER — Ambulatory Visit (HOSPITAL_COMMUNITY)
Admission: RE | Admit: 2014-01-22 | Discharge: 2014-01-22 | Disposition: A | Discharge status: Home / Self Care | Payer: BC Managed Care – PPO | Source: Ambulatory Visit | Attending: General Surgery | Admitting: General Surgery

## 2014-01-22 ENCOUNTER — Telehealth: Payer: Self-pay | Admitting: *Deleted

## 2014-01-22 VITALS — BP 96/54 | HR 104 | Wt 204.5 lb

## 2014-01-22 DIAGNOSIS — C50519 Malignant neoplasm of lower-outer quadrant of unspecified female breast: Secondary | ICD-10-CM

## 2014-01-22 DIAGNOSIS — C50919 Malignant neoplasm of unspecified site of unspecified female breast: Secondary | ICD-10-CM | POA: Insufficient documentation

## 2014-01-22 DIAGNOSIS — C50511 Malignant neoplasm of lower-outer quadrant of right female breast: Secondary | ICD-10-CM

## 2014-01-22 DIAGNOSIS — I519 Heart disease, unspecified: Secondary | ICD-10-CM

## 2014-01-22 IMAGING — MG MM DIAGNOSTIC UNILATERAL L
2 series · 2 of 2 positions shown · non-contrast
Comparison: Previous exams

CLINICAL DATA: Status post MRI guided left breast biopsy.

EXAM:
DIAGNOSTIC LEFT MAMMOGRAM POST ULTRASOUND BIOPSY

[L CC]
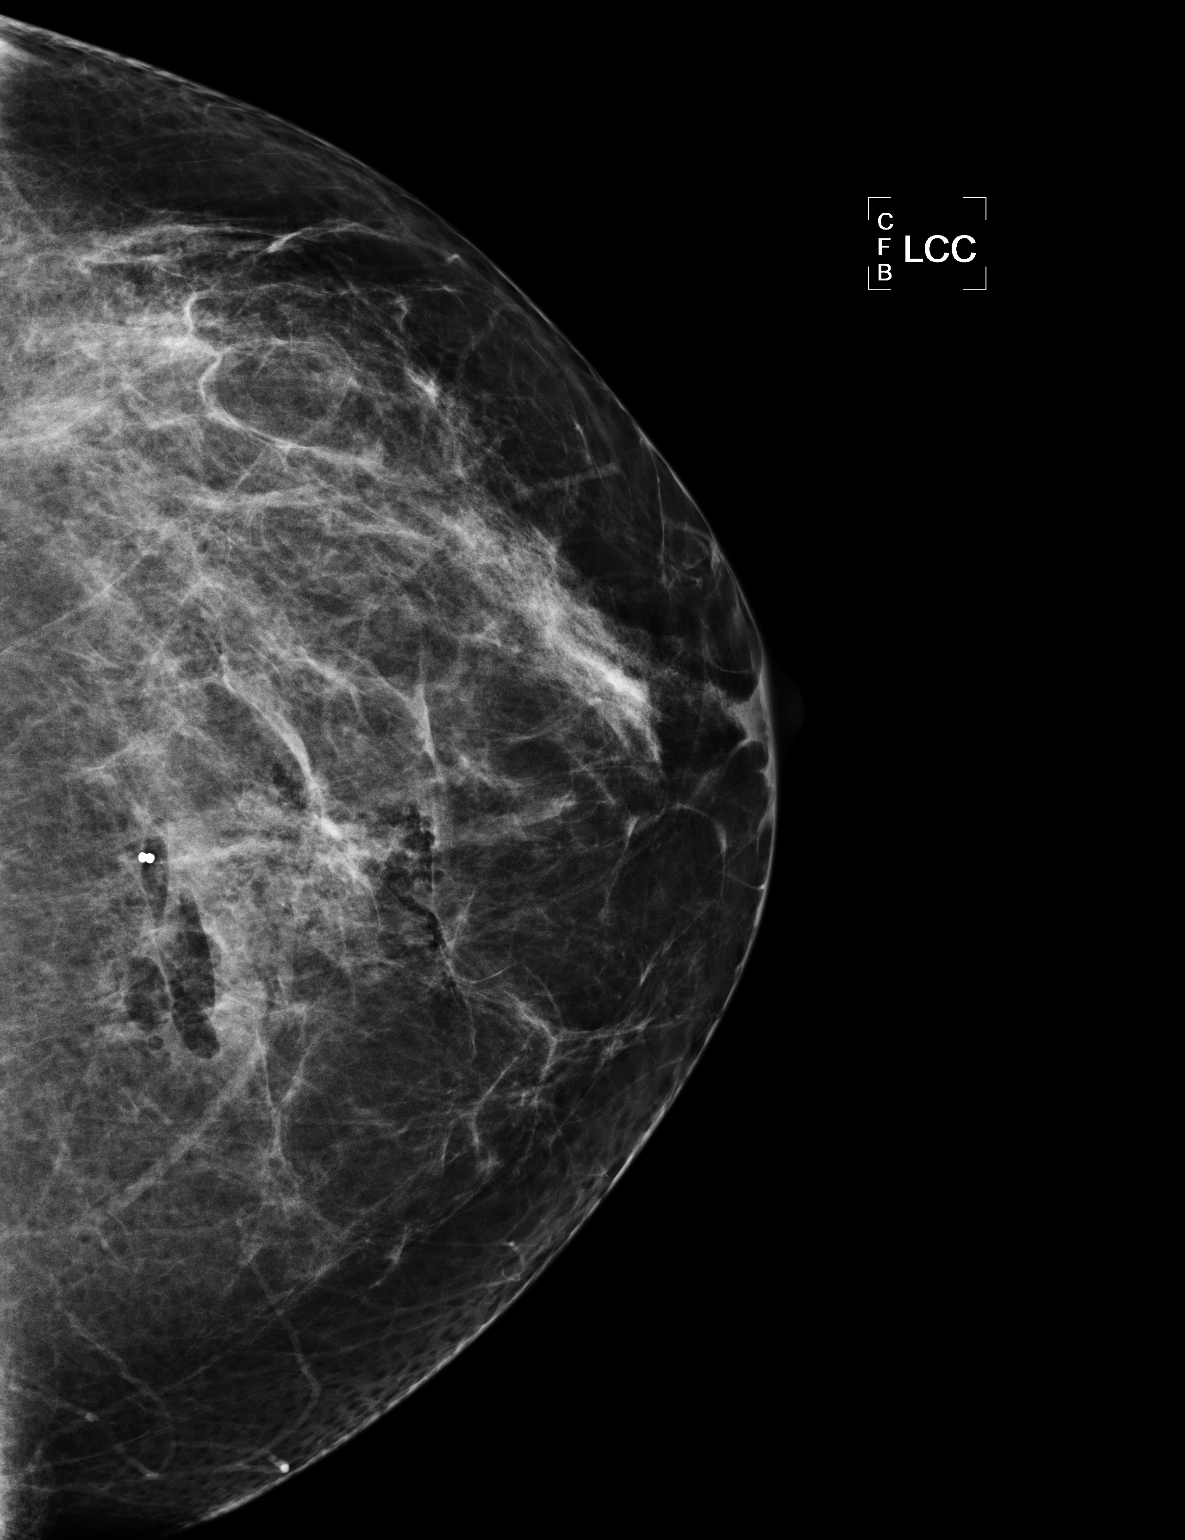

[L ML]
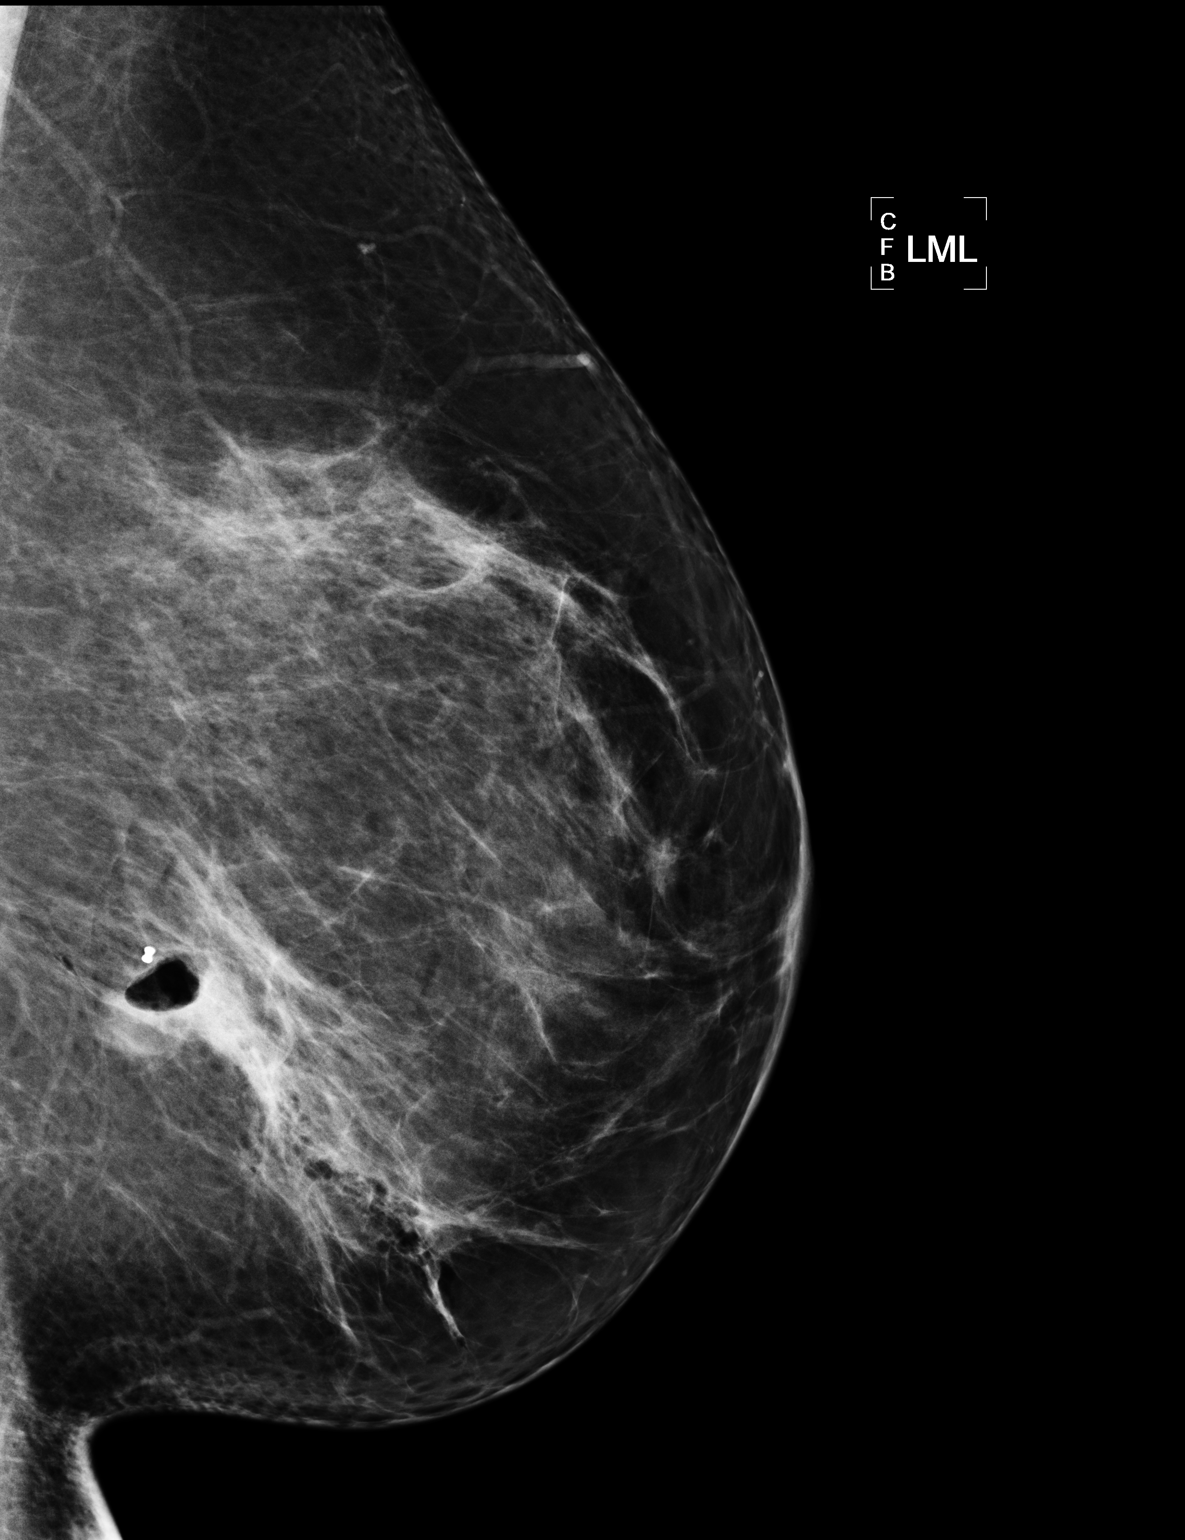

[2 of 2 positions shown; findings below may reference images not displayed]

FINDINGS: Mammographic images were obtained following MRI guided biopsy of
left breast. Biopsy marking clip in appropriate position 6 o'clock
left breast posterior depth.
IMPRESSION: Appropriate position left breast biopsy marking clip status post MRI
guided core needle biopsy.

Final Assessment: Post Procedure Mammograms for Marker Placement

## 2014-01-22 NOTE — Progress Notes (Signed)
Patient ID: Joan Valdez, female   DOB: February 18, 1971, 43 y.o.   MRN: [id number] Oncologist: Dr. Humphrey Rolls  43 yo with R breast cancer diagnosed in 12/14,  ER+/PR+/HER-2/neu+.  Started neoadjuvant chemo (1/15) with Taxotere, carboplatin, Herceptin, Perjeta with plan for 6 cycles and total of 1 year Herceptin.   Echo 3/15:  EF 60-10%, grade I diastolic dysfunction, lateral s' 16.1 cm/sec, global longitudinal strain -12.6% (?accuracy).   ECHO 01/19/14: EF 65-70% lateral s' 15.7 GLS -13.7 (inaccurate)  Four months into Herceptin. Doing well. Has finished chemo. Plan for surgery next week. No further palpitations. No SOB, CP or edema.   ECG: sinus tachy, inferior and anterolateral T wave inversions  PMH: 1. GERD 2. Hypothyroidism 3. Breast cancer: On right, ER+/PR+/HER-2/neu+.  Diagnosed 12/14.  Started neoadjuvant chemo (1/15) with Taxotere, carboplatin, Herceptin, Perjeta with plan for 6 cycles and total of 1 year Herceptin. - Echo (3/15) with EF 93-23%, grade I diastolic dysfunction, lateral s' 16.1 cm/sec, global longitudinal strain -12.6% (?accuracy).    SH: Nonsmoker, lives in [place], not working currently.   FH: Grandmother with pacemaker.    ROS: All systems reviewed and negative except as per HPI.   Current Outpatient Prescriptions  Medication Sig Dispense Refill  . acetaminophen (TYLENOL) 500 MG tablet Take 500 mg by mouth every 6 (six) hours as needed for mild pain.      . cetirizine (ZYRTEC) 10 MG tablet Take 10 mg by mouth at bedtime as needed for allergies.       Marland Kitchen dexamethasone (DECADRON) 4 MG tablet       . dexlansoprazole (DEXILANT) 60 MG capsule Take 60 mg by mouth daily.      Marland Kitchen dihydroergotamine (MIGRANAL) 4 MG/ML nasal spray Place 1 spray into the nose as needed for migraine. Use in one nostril as directed.  No more than 4 sprays in one hour      . Eflornithine HCl (VANIQA) 13.9 % cream Apply 1 application topically daily.      . Goserelin Acetate (ZOLADEX ) Inject  into the skin every 30 (thirty) days. Next injection due on May 12      . levothyroxine (SYNTHROID, LEVOTHROID) 100 MCG tablet Take 100 mcg by mouth daily.       Marland Kitchen lidocaine-prilocaine (EMLA) cream Apply 1 application topically once as needed (if need for port).      Marland Kitchen liothyronine (CYTOMEL) 25 MCG tablet Take 12.5 mcg by mouth 2 (two) times daily.       Marland Kitchen LORazepam (ATIVAN) 0.5 MG tablet Take 1 tablet (0.5 mg total) by mouth every 6 (six) hours as needed for anxiety.  30 tablet  0  . mometasone (NASONEX) 50 MCG/ACT nasal spray Place 2 sprays into both nostrils daily as needed (allergies).       . Nutritional Supplements (JUICE PLUS FIBRE PO) Take 3 tablets by mouth daily.       . ondansetron (ZOFRAN) 8 MG tablet Take 8 mg by mouth See admin instructions. Take 1 tablet twice a day starting the day after chemo for three days, then as needed nausea and vomiting symptoms      . potassium chloride SA (K-DUR,KLOR-CON) 20 MEQ tablet Take 20 mEq by mouth 2 (two) times daily. For 5 days      . ranitidine (ZANTAC) 300 MG tablet Take 300 mg by mouth at bedtime.      . topiramate (TOPAMAX) 100 MG tablet Take 200 mg by mouth at bedtime.      Marland Kitchen  zolpidem (AMBIEN) 10 MG tablet Take 10 mg by mouth at bedtime.       No current facility-administered medications for this encounter.   BP 96/54  Pulse 104  Wt 204 lb 8 oz (92.761 kg)  SpO2 100% General: NAD Neck: No JVD, no thyromegaly or thyroid nodule.  Lungs: Clear to auscultation bilaterally with normal respiratory effort. CV: Nondisplaced PMI.  Heart regular S1/S2, no S3/S4, no murmur.  No peripheral edema.  No carotid bruit.  Normal pedal pulses.  Abdomen: Obese. Soft, nontender, no hepatosplenomegaly, no distention.  Skin: Intact without lesions or rashes.  Neurologic: Alert and oriented x 3.  Psych: Normal affect. Extremities: No clubbing or cyanosis.  HEENT: Normal.   Assessment/Plan: 1. Breast cancer: I reviewed echos personally. EF and Doppler  parameters stable. No HF on exam. Continue Herceptin.  2. Palpitations: Resolved 3. Abnormal ECG: Somewhat nonspecific but not normal. Will need to follow LV function closely.   Shaune Pascal Malina Geers MD 01/22/2014

## 2014-01-22 NOTE — Progress Notes (Signed)
  Echocardiogram 2D Echocardiogram has been performed.  Joan Valdez 01/22/2014, 10:15 AM

## 2014-01-22 NOTE — Addendum Note (Signed)
Encounter addended by: Scarlette Calico, RN on: 01/22/2014 10:50 AM<BR>     Documentation filed: Patient Instructions Section

## 2014-01-22 NOTE — Patient Instructions (Signed)
We will contact you in 3 months to schedule your next appointment and echocardiogram  

## 2014-01-22 NOTE — Pre-Procedure Instructions (Signed)
Joan Valdez  [birth year]   Your procedure is scheduled on:  Thurs, May 21 @ 7:30 AM  Report to [facility]  at 5:30 AM.  Call this number if you have problems the morning of surgery: [phone]   Remember:   Do not eat food or drink liquids after midnight.   Take these medicines the morning of surgery with A SIP OF WATER: Dexilant(Dexlansoprazole),Synthroid(Levothyroxine),Ativan(Lorazepam),Nasonex(if needed),Zofran(Ondansetron-if needed),and Zantac(Ranitidine)              No Goody's,BC's,Aleve,Aspirin,Ibuprofen,Fish Oil,or any Herbal Medications   Do not wear jewelry, make-up or nail polish.  Do not wear lotions, powders, or perfumes. You may wear deodorant.  Do not shave 48 hours prior to surgery.   Do not bring valuables to the hospital.  [facility] is not responsible                  for any belongings or valuables.               Contacts, dentures or bridgework may not be worn into surgery.  Leave suitcase in the car. After surgery it may be brought to your room.  For patients admitted to the hospital, discharge time is determined by your                treatment team.                  Special Instructions:  [facility] - Preparing for Surgery  Before surgery, you can play an important role.  Because skin is not sterile, your skin needs to be as free of germs as possible.  You can reduce the number of germs on you skin by washing with CHG (chlorahexidine gluconate) soap before surgery.  CHG is an antiseptic cleaner which kills germs and bonds with the skin to continue killing germs even after washing.  Please DO NOT use if you have an allergy to CHG or antibacterial soaps.  If your skin becomes reddened/irritated stop using the CHG and inform your nurse when you arrive at Short Stay.  Do not shave (including legs and underarms) for at least 48 hours prior to the first CHG shower.  You may shave your face.  Please follow these instructions carefully:   1.   Shower with CHG Soap the night before surgery and the                                morning of Surgery.  2.  If you choose to wash your hair, wash your hair first as usual with your       normal shampoo.  3.  After you shampoo, rinse your hair and body thoroughly to remove the                      Shampoo.  4.  Use CHG as you would any other liquid soap.  You can apply chg directly       to the skin and wash gently with scrungie or a clean washcloth.  5.  Apply the CHG Soap to your body ONLY FROM THE NECK DOWN.        Do not use on open wounds or open sores.  Avoid contact with your eyes,       ears, mouth and genitals (private parts).  Wash genitals (private parts)       with your normal  soap.  6.  Wash thoroughly, paying special attention to the area where your surgery        will be performed.  7.  Thoroughly rinse your body with warm water from the neck down.  8.  DO NOT shower/wash with your normal soap after using and rinsing off       the CHG Soap.  9.  Pat yourself dry with a clean towel.            10.  Wear clean pajamas.            11.  Place clean sheets on your bed the night of your first shower and do not        sleep with pets.  Day of Surgery  Do not apply any lotions/deoderants the morning of surgery.  Please wear clean clothes to the hospital/surgery center.     Please read over the following fact sheets that you were given: Pain Booklet, Coughing and Deep Breathing and Surgical Site Infection Prevention

## 2014-01-22 NOTE — Telephone Encounter (Signed)
Per staff message and POF I have scheduled appts.  JMW  

## 2014-01-23 ENCOUNTER — Encounter (HOSPITAL_COMMUNITY): Payer: Self-pay

## 2014-01-23 ENCOUNTER — Ambulatory Visit (HOSPITAL_BASED_OUTPATIENT_CLINIC_OR_DEPARTMENT_OTHER): Payer: BC Managed Care – PPO

## 2014-01-23 ENCOUNTER — Encounter (HOSPITAL_COMMUNITY)
Admission: RE | Admit: 2014-01-23 | Discharge: 2014-01-23 | Disposition: A | Discharge status: Home / Self Care | Payer: BC Managed Care – PPO | Source: Ambulatory Visit | Attending: General Surgery | Admitting: General Surgery

## 2014-01-23 ENCOUNTER — Ambulatory Visit: Payer: BC Managed Care – PPO

## 2014-01-23 VITALS — BP 140/73 | HR 123 | Temp 99.4°F

## 2014-01-23 DIAGNOSIS — C50519 Malignant neoplasm of lower-outer quadrant of unspecified female breast: Secondary | ICD-10-CM

## 2014-01-23 DIAGNOSIS — Z5111 Encounter for antineoplastic chemotherapy: Secondary | ICD-10-CM

## 2014-01-23 DIAGNOSIS — C50511 Malignant neoplasm of lower-outer quadrant of right female breast: Secondary | ICD-10-CM

## 2014-01-23 DIAGNOSIS — Z01812 Encounter for preprocedural laboratory examination: Secondary | ICD-10-CM | POA: Insufficient documentation

## 2014-01-23 HISTORY — DX: Family history of other specified conditions: Z84.89

## 2014-01-23 HISTORY — DX: Insomnia, unspecified: G47.00

## 2014-01-23 LAB — CBC
HCT: 30.3 % — ABNORMAL LOW (ref 36.0–46.0)
Hemoglobin: 10.4 g/dL — ABNORMAL LOW (ref 12.0–15.0)
MCH: 36.6 pg — AB (ref 26.0–34.0)
MCHC: 34.3 g/dL (ref 30.0–36.0)
MCV: 106.7 fL — AB (ref 78.0–100.0)
Platelets: 132 10*3/uL — ABNORMAL LOW (ref 150–400)
RBC: 2.84 MIL/uL — ABNORMAL LOW (ref 3.87–5.11)
RDW: 15.3 % (ref 11.5–15.5)
WBC: 19.8 10*3/uL — ABNORMAL HIGH (ref 4.0–10.5)

## 2014-01-23 LAB — BASIC METABOLIC PANEL
BUN: 12 mg/dL (ref 6–23)
CALCIUM: 8.9 mg/dL (ref 8.4–10.5)
CO2: 24 meq/L (ref 19–32)
CREATININE: 0.66 mg/dL (ref 0.50–1.10)
Chloride: 106 mEq/L (ref 96–112)
GFR calc Af Amer: >90 mL/min (ref >90)
GLUCOSE: 99 mg/dL (ref 70–99)
Potassium: 3.7 mEq/L (ref 3.7–5.3)
SODIUM: 144 meq/L (ref 137–147)

## 2014-01-23 MED ORDER — CHLORHEXIDINE GLUCONATE 4 % EX LIQD: 1.0000 "application " | Freq: Once | CUTANEOUS | Status: DC

## 2014-01-23 MED ORDER — GOSERELIN ACETATE 3.6 MG ~~LOC~~ IMPL
3.6000 mg | DRUG_IMPLANT | Freq: Once | SUBCUTANEOUS | Status: AC
  Administered 2014-01-23: 3.6 mg via SUBCUTANEOUS
  Filled 2014-01-23: qty 3.6

## 2014-01-23 NOTE — Progress Notes (Addendum)
Cardiologist is Dr.Bensimon with last visit 01/22/14   3 echo reports in epic with most recent on 01/22/14  Denies ever having a heart cath or stress test  Denies CXR in past yr  Medical Md is Dr.[name]

## 2014-01-24 ENCOUNTER — Telehealth (INDEPENDENT_AMBULATORY_CARE_PROVIDER_SITE_OTHER): Payer: Self-pay | Admitting: General Surgery

## 2014-01-24 ENCOUNTER — Other Ambulatory Visit: Payer: Self-pay | Admitting: Plastic Surgery

## 2014-01-24 NOTE — Telephone Encounter (Signed)
herceptin will not affect wound healing

## 2014-01-24 NOTE — Telephone Encounter (Signed)
Called pt and gave below msg  

## 2014-01-24 NOTE — Telephone Encounter (Signed)
Patient called in to let us know she is scheduled for total masty on 02/01/14 but she was scheduled today for a herceptin treatment on 5/19.  She wanted to know if this was okay, she asked Dr. Harlow Mares and he informed her to ask Dr. Marlou Starks his opinion. Informed her that I would route this message to Dr. Marlou Starks for an answer and that we would get back in touch with her.

## 2014-01-24 NOTE — Progress Notes (Addendum)
Anesthesia chart review: Patient is a 43 year old female scheduled for bilateral mastectomy, right sentinel node biopsy, left reconstruction on 02/01/14 by Dr. Marlou Starks and Dr. Harlow Mares.  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  BMI is 37 consistent with obesity. She is followed by cardiologist Dr. Haroldine Laws and Dr. Aundra Dubin since she is on Herceptin. Dr. Haroldine Laws last saw her on 01/22/14 and is aware of plans for surgery. Oncologist is Dr. Jana Hakim. PCP is Dr. Jenna Luo.  EKG on 12/12/13 showed: ST @ 104 bpm, T wave abnormality, consider inferolateral ischemia.  This was already reviewed by cardiologist Dr. Aundra Dubin.  Close follow-up with serial echocardiograms recommended.   Echo on 01/22/14 showed: - Left ventricle: The cavity size was normal. Wall thickness was normal. Systolic function was normal. The estimated ejection fraction was in the range of 60% to 65%. Wall motion was normal; there were no regional wall motion abnormalities. Doppler parameters are consistent with abnormal left ventricular relaxation (grade 1 diastolic dysfunction). - Mitral valve: Calcified annulus. Impressions: Global strain 13.7%. Compared to 12/05/13, LV function remains; previous global strain 12.6%.  Her 1V CXR on 09/04/13 showed no acute abnormalities.  Preoperative labs noted.  WBC 19.8K, previously 18.5 on 01/16/14, 11.1 on 01/09/14, and 36.6 on 12/26/13.  She was taking dexamethasone prior to her chemotherapy treatments which she recently stopped. She has also had Neulasta injections in the past with reportedly her last dose ~ 2 weeks ago. Neulasta was thought to be the cause of her leukocytosis back in April 2015. H/H 10.4/30.3.  PLT count 132K. Dr. Marlou Starks  would like her CBC repeated preoperatively because if her WBC remains elevated then it is possible that reconstruction will need to be delayed. I called and spoke with patient.  She is not having any fever and denies any known infection. She understands that Dr. Marlou Starks would like her CBC repeated.  She has an appointment at [facility] for Herceptin on 01/30/14.  If they are planning to do labs there then hopefully we can use those results; otherwise I told her that we would have her stop by PAT for repeat labs once I confirm with Dr. Marlou Starks that he does not have a specific date in mind.  Would also consider a serum pregnancy test, although she has been in medically induced menopause.  (She is on Zoladex monthly.)  Cardiology is aware of abnormal EKG and of plans for surgery. She has had serial echocardiograms, and no other testing ordered at this point. She will have her CBC repeated.  Plans to proceed may depend of results.    [facility] Short Stay Center/Anesthesiology Phone [phone] 01/24/2014 5:10 PM  Addendum: 01/25/2014 10:37 AM I spoke with Dr. Virgie Dad nurse Mateo Flow.  She can get labs at the [facility] when she goes there for Herceptin on 01/30/14. Mateo Flow did agree with getting a preoperative serum pregnancy test and said it could be done there as well (I already entered the order). I have notified patient that labs will be done at [facility] on 01/30/14 unless I hear of more specific instructions from Dr. Marlou Starks.  Of note, her last Neulasta was 01/10/14, and I am told that effects may linger ~ 3 weeks.    Addendum: 01/31/14 9:55 AM Labs from 01/30/14 reviewed.  Serum pregnancy test was negative.  WBC down to 12.8. H/H stable at 10.2/30.3. Repeat CBC results routed to Dr. Marlou Starks and Dr. Harlow Mares.  Would anticipate that she could proceed from an anesthesia standpoint.

## 2014-01-25 ENCOUNTER — Other Ambulatory Visit: Payer: Self-pay | Admitting: *Deleted

## 2014-01-25 DIAGNOSIS — C50511 Malignant neoplasm of lower-outer quadrant of right female breast: Secondary | ICD-10-CM

## 2014-01-25 NOTE — Progress Notes (Unsigned)
This RN spoke with PA- anesetheologist for pending breast surgery and reconstruction later this month. Noted labs per pre-op work up shows elevated WBC- probable neulasta induced.  Need to recheck

## 2014-01-30 ENCOUNTER — Other Ambulatory Visit: Payer: Self-pay | Admitting: Medical Oncology

## 2014-01-30 ENCOUNTER — Other Ambulatory Visit: Payer: Self-pay | Admitting: Adult Health

## 2014-01-30 ENCOUNTER — Other Ambulatory Visit (HOSPITAL_BASED_OUTPATIENT_CLINIC_OR_DEPARTMENT_OTHER): Payer: BC Managed Care – PPO

## 2014-01-30 ENCOUNTER — Ambulatory Visit (HOSPITAL_BASED_OUTPATIENT_CLINIC_OR_DEPARTMENT_OTHER): Payer: BC Managed Care – PPO

## 2014-01-30 ENCOUNTER — Other Ambulatory Visit (HOSPITAL_BASED_OUTPATIENT_CLINIC_OR_DEPARTMENT_OTHER): Payer: BC Managed Care – PPO | Admitting: Adult Health

## 2014-01-30 ENCOUNTER — Other Ambulatory Visit: Payer: Self-pay | Admitting: Oncology

## 2014-01-30 VITALS — BP 129/86 | HR 130 | Temp 97.4°F | Resp 18

## 2014-01-30 DIAGNOSIS — C50519 Malignant neoplasm of lower-outer quadrant of unspecified female breast: Secondary | ICD-10-CM

## 2014-01-30 DIAGNOSIS — Z5112 Encounter for antineoplastic immunotherapy: Secondary | ICD-10-CM

## 2014-01-30 DIAGNOSIS — C50511 Malignant neoplasm of lower-outer quadrant of right female breast: Secondary | ICD-10-CM

## 2014-01-30 LAB — COMPREHENSIVE METABOLIC PANEL (CC13)
ALK PHOS: 66 U/L (ref 40–150)
ALT: 25 U/L (ref 0–55)
AST: 15 U/L (ref 5–34)
Albumin: 3.2 g/dL — ABNORMAL LOW (ref 3.5–5.0)
Anion Gap: 11 mEq/L (ref 3–11)
BUN: 10.8 mg/dL (ref 7.0–26.0)
CHLORIDE: 113 meq/L — AB (ref 98–109)
CO2: 20 mEq/L — ABNORMAL LOW (ref 22–29)
CREATININE: 0.7 mg/dL (ref 0.6–1.1)
Calcium: 8.4 mg/dL (ref 8.4–10.4)
Glucose: 112 mg/dl (ref 70–140)
POTASSIUM: 3.4 meq/L — AB (ref 3.5–5.1)
Sodium: 144 mEq/L (ref 136–145)
Total Bilirubin: 0.24 mg/dL (ref 0.20–1.20)
Total Protein: 5.6 g/dL — ABNORMAL LOW (ref 6.4–8.3)

## 2014-01-30 LAB — CBC WITH DIFFERENTIAL/PLATELET
BASO%: 0.5 % (ref 0.0–2.0)
Basophils Absolute: 0.1 10*3/uL (ref 0.0–0.1)
EOS%: 0 % (ref 0.0–7.0)
Eosinophils Absolute: 0 10*3/uL (ref 0.0–0.5)
HEMATOCRIT: 30.3 % — AB (ref 34.8–46.6)
HGB: 10.2 g/dL — ABNORMAL LOW (ref 11.6–15.9)
LYMPH%: 16.6 % (ref 14.0–49.7)
MCH: 36 pg — AB (ref 25.1–34.0)
MCHC: 33.7 g/dL (ref 31.5–36.0)
MCV: 106.6 fL — ABNORMAL HIGH (ref 79.5–101.0)
MONO#: 0.8 10*3/uL (ref 0.1–0.9)
MONO%: 6.2 % (ref 0.0–14.0)
NEUT#: 9.9 10*3/uL — ABNORMAL HIGH (ref 1.5–6.5)
NEUT%: 76.7 % (ref 38.4–76.8)
Platelets: 157 10*3/uL (ref 145–400)
RBC: 2.84 10*6/uL — ABNORMAL LOW (ref 3.70–5.45)
RDW: 15.7 % — ABNORMAL HIGH (ref 11.2–14.5)
WBC: 12.8 10*3/uL — ABNORMAL HIGH (ref 3.9–10.3)
lymph#: 2.1 10*3/uL (ref 0.9–3.3)

## 2014-01-30 LAB — HCG, SERUM, QUALITATIVE: PREG SERUM: NEGATIVE

## 2014-01-30 MED ORDER — HEPARIN SOD (PORK) LOCK FLUSH 100 UNIT/ML IV SOLN
500.0000 [IU] | Freq: Once | INTRAVENOUS | Status: AC | PRN
  Administered 2014-01-30: 500 [IU]
  Filled 2014-01-30: qty 5

## 2014-01-30 MED ORDER — ACETAMINOPHEN 325 MG PO TABS
ORAL_TABLET | ORAL | Status: AC
  Filled 2014-01-30: qty 2

## 2014-01-30 MED ORDER — DIPHENHYDRAMINE HCL 25 MG PO CAPS
ORAL_CAPSULE | ORAL | Status: AC
  Filled 2014-01-30: qty 2

## 2014-01-30 MED ORDER — DIPHENHYDRAMINE HCL 25 MG PO CAPS
50.0000 mg | ORAL_CAPSULE | Freq: Once | ORAL | Status: AC
  Administered 2014-01-30: 50 mg via ORAL

## 2014-01-30 MED ORDER — ACETAMINOPHEN 325 MG PO TABS
650.0000 mg | ORAL_TABLET | Freq: Once | ORAL | Status: AC
Start: 2014-01-30 — End: 2014-01-30
  Administered 2014-01-30: 650 mg via ORAL

## 2014-01-30 MED ORDER — TRASTUZUMAB CHEMO INJECTION 440 MG
6.0000 mg/kg | Freq: Once | INTRAVENOUS | Status: AC
  Administered 2014-01-30: 546 mg via INTRAVENOUS
  Filled 2014-01-30: qty 26

## 2014-01-30 MED ORDER — SODIUM CHLORIDE 0.9 % IV SOLN
Freq: Once | INTRAVENOUS | Status: AC
  Administered 2014-01-30: 14:00:00 via INTRAVENOUS

## 2014-01-30 MED ORDER — SODIUM CHLORIDE 0.9 % IJ SOLN
10.0000 mL | INTRAMUSCULAR | Status: DC | PRN
  Administered 2014-01-30: 10 mL
  Filled 2014-01-30: qty 10

## 2014-01-30 NOTE — Patient Instructions (Signed)

## 2014-01-31 MED ORDER — VANCOMYCIN HCL IN DEXTROSE 1-5 GM/200ML-% IV SOLN
1000.0000 mg | INTRAVENOUS | Status: AC
  Administered 2014-02-01: 1000 mg via INTRAVENOUS
  Filled 2014-01-31: qty 200

## 2014-01-31 MED ORDER — CHLORHEXIDINE GLUCONATE 4 % EX LIQD
1.0000 "application " | Freq: Once | CUTANEOUS | Status: DC
  Filled 2014-01-31: qty 15

## 2014-02-01 ENCOUNTER — Encounter (HOSPITAL_COMMUNITY): Payer: BC Managed Care – PPO | Admitting: Vascular Surgery

## 2014-02-01 ENCOUNTER — Ambulatory Visit (HOSPITAL_COMMUNITY): Payer: BC Managed Care – PPO | Admitting: Anesthesiology

## 2014-02-01 ENCOUNTER — Inpatient Hospital Stay (HOSPITAL_COMMUNITY)
Admission: RE | Admit: 2014-02-01 | Discharge: 2014-02-04 | DRG: 578 | Disposition: A | Discharge status: Home / Self Care | Payer: BC Managed Care – PPO | Source: Ambulatory Visit | Attending: Plastic Surgery | Admitting: Plastic Surgery

## 2014-02-01 ENCOUNTER — Encounter (HOSPITAL_COMMUNITY): Payer: BC Managed Care – PPO

## 2014-02-01 ENCOUNTER — Encounter (HOSPITAL_COMMUNITY): Payer: Self-pay | Admitting: *Deleted

## 2014-02-01 ENCOUNTER — Encounter (HOSPITAL_COMMUNITY)
Admission: RE | Disposition: A | Discharge status: Home / Self Care | Payer: Self-pay | Source: Ambulatory Visit | Attending: Plastic Surgery

## 2014-02-01 DIAGNOSIS — Z853 Personal history of malignant neoplasm of breast: Secondary | ICD-10-CM

## 2014-02-01 DIAGNOSIS — R112 Nausea with vomiting, unspecified: Secondary | ICD-10-CM | POA: Diagnosis not present

## 2014-02-01 DIAGNOSIS — D0590 Unspecified type of carcinoma in situ of unspecified breast: Secondary | ICD-10-CM

## 2014-02-01 DIAGNOSIS — C50919 Malignant neoplasm of unspecified site of unspecified female breast: Principal | ICD-10-CM | POA: Diagnosis present

## 2014-02-01 DIAGNOSIS — Z4001 Encounter for prophylactic removal of breast: Secondary | ICD-10-CM

## 2014-02-01 HISTORY — PX: MASTECTOMY W/ SENTINEL NODE BIOPSY: SHX2001

## 2014-02-01 HISTORY — PX: TOTAL MASTECTOMY: SHX6129

## 2014-02-01 HISTORY — PX: MASTECTOMY COMPLETE / SIMPLE: SUR845

## 2014-02-01 HISTORY — DX: Calculus of kidney: N20.0

## 2014-02-01 HISTORY — PX: MASTECTOMY MODIFIED RADICAL W/ AXILLARY LYMPH NODES W/ OR W/O PECTORALIS MINOR: SUR850

## 2014-02-01 HISTORY — PX: LATISSIMUS FLAP TO BREAST: SHX5357

## 2014-02-01 LAB — ABO/RH: ABO/RH(D): B POS

## 2014-02-01 SURGERY — MASTECTOMY, SIMPLE
Anesthesia: General | Site: Chest | Laterality: Right

## 2014-02-01 MED ORDER — GLYCOPYRROLATE 0.2 MG/ML IJ SOLN
INTRAMUSCULAR | Status: AC
  Filled 2014-02-01: qty 3

## 2014-02-01 MED ORDER — ONDANSETRON HCL 4 MG/2ML IJ SOLN
4.0000 mg | Freq: Four times a day (QID) | INTRAMUSCULAR | Status: DC | PRN
  Administered 2014-02-01: 4 mg via INTRAVENOUS
  Filled 2014-02-01: qty 2

## 2014-02-01 MED ORDER — POTASSIUM CHLORIDE CRYS ER 20 MEQ PO TBCR
20.0000 meq | EXTENDED_RELEASE_TABLET | Freq: Two times a day (BID) | ORAL | Status: DC
  Administered 2014-02-01 – 2014-02-02 (×3): 20 meq via ORAL
  Filled 2014-02-01 (×10): qty 1

## 2014-02-01 MED ORDER — FAMOTIDINE 20 MG PO TABS
20.0000 mg | ORAL_TABLET | Freq: Every day | ORAL | Status: DC
  Administered 2014-02-02: 20 mg via ORAL
  Filled 2014-02-01 (×3): qty 1

## 2014-02-01 MED ORDER — HYDROMORPHONE HCL PF 1 MG/ML IJ SOLN
0.2500 mg | INTRAMUSCULAR | Status: DC | PRN
  Administered 2014-02-01 (×2): 0.5 mg via INTRAVENOUS

## 2014-02-01 MED ORDER — PROMETHAZINE HCL 25 MG/ML IJ SOLN
6.2500 mg | INTRAMUSCULAR | Status: DC | PRN
  Administered 2014-02-01 – 2014-02-04 (×12): 6.25 mg via INTRAVENOUS
  Filled 2014-02-01 (×12): qty 1

## 2014-02-01 MED ORDER — METHOCARBAMOL 500 MG PO TABS
500.0000 mg | ORAL_TABLET | Freq: Four times a day (QID) | ORAL | Status: DC | PRN
  Administered 2014-02-01 – 2014-02-02 (×2): 500 mg via ORAL
  Filled 2014-02-01: qty 1

## 2014-02-01 MED ORDER — ROCURONIUM BROMIDE 50 MG/5ML IV SOLN
INTRAVENOUS | Status: AC
  Filled 2014-02-01: qty 1

## 2014-02-01 MED ORDER — HYDROMORPHONE HCL PF 1 MG/ML IJ SOLN
INTRAMUSCULAR | Status: AC
  Filled 2014-02-01: qty 1

## 2014-02-01 MED ORDER — DIPHENHYDRAMINE HCL 12.5 MG/5ML PO ELIX: 12.5000 mg | ORAL_SOLUTION | Freq: Four times a day (QID) | ORAL | Status: DC | PRN

## 2014-02-01 MED ORDER — ENOXAPARIN SODIUM 40 MG/0.4ML ~~LOC~~ SOLN
40.0000 mg | Freq: Once | SUBCUTANEOUS | Status: AC
  Administered 2014-02-01: 40 mg via SUBCUTANEOUS
  Filled 2014-02-01: qty 0.4

## 2014-02-01 MED ORDER — ROCURONIUM BROMIDE 100 MG/10ML IV SOLN
INTRAVENOUS | Status: DC | PRN
  Administered 2014-02-01: 10 mg via INTRAVENOUS
  Administered 2014-02-01 (×2): 20 mg via INTRAVENOUS
  Administered 2014-02-01: 50 mg via INTRAVENOUS
  Administered 2014-02-01 (×3): 10 mg via INTRAVENOUS
  Administered 2014-02-01: 50 mg via INTRAVENOUS

## 2014-02-01 MED ORDER — TOPIRAMATE 100 MG PO TABS
200.0000 mg | ORAL_TABLET | Freq: Every day | ORAL | Status: DC
  Administered 2014-02-01 – 2014-02-03 (×3): 200 mg via ORAL
  Filled 2014-02-01 (×4): qty 2

## 2014-02-01 MED ORDER — NEOSTIGMINE METHYLSULFATE 10 MG/10ML IV SOLN
INTRAVENOUS | Status: DC | PRN
  Administered 2014-02-01: .6 mg via INTRAVENOUS

## 2014-02-01 MED ORDER — LORAZEPAM 0.5 MG PO TABS: 0.5000 mg | ORAL_TABLET | Freq: Four times a day (QID) | ORAL | Status: DC | PRN

## 2014-02-01 MED ORDER — VANCOMYCIN HCL IN DEXTROSE 1-5 GM/200ML-% IV SOLN: 1000.0000 mg | INTRAVENOUS | Status: DC

## 2014-02-01 MED ORDER — FENTANYL CITRATE 0.05 MG/ML IJ SOLN
INTRAMUSCULAR | Status: AC
  Filled 2014-02-01: qty 5

## 2014-02-01 MED ORDER — 0.9 % SODIUM CHLORIDE (POUR BTL) OPTIME
TOPICAL | Status: DC | PRN
  Administered 2014-02-01 (×2): 2000 mL

## 2014-02-01 MED ORDER — LACTATED RINGERS IV SOLN
INTRAVENOUS | Status: DC | PRN
  Administered 2014-02-01 (×4): via INTRAVENOUS

## 2014-02-01 MED ORDER — DEXTROSE-NACL 5-0.45 % IV SOLN
INTRAVENOUS | Status: DC
  Administered 2014-02-01 – 2014-02-04 (×7): via INTRAVENOUS

## 2014-02-01 MED ORDER — SODIUM CHLORIDE 0.9 % IR SOLN
Status: DC | PRN
  Administered 2014-02-01: 14:00:00

## 2014-02-01 MED ORDER — MIDAZOLAM HCL 5 MG/5ML IJ SOLN
INTRAMUSCULAR | Status: DC | PRN
  Administered 2014-02-01: 2 mg via INTRAVENOUS

## 2014-02-01 MED ORDER — LIDOCAINE HCL (CARDIAC) 20 MG/ML IV SOLN
INTRAVENOUS | Status: DC | PRN
  Administered 2014-02-01: 100 mg via INTRAVENOUS

## 2014-02-01 MED ORDER — PROMETHAZINE HCL 25 MG/ML IJ SOLN: 6.2500 mg | INTRAMUSCULAR | Status: DC | PRN

## 2014-02-01 MED ORDER — ONDANSETRON HCL 4 MG/2ML IJ SOLN
INTRAMUSCULAR | Status: DC | PRN
  Administered 2014-02-01: 4 mg via INTRAVENOUS

## 2014-02-01 MED ORDER — DOXYCYCLINE HYCLATE 100 MG PO TABS
100.0000 mg | ORAL_TABLET | Freq: Two times a day (BID) | ORAL | Status: DC
  Administered 2014-02-01 – 2014-02-02 (×3): 100 mg via ORAL
  Filled 2014-02-01 (×6): qty 1

## 2014-02-01 MED ORDER — DIPHENHYDRAMINE HCL 50 MG/ML IJ SOLN
12.5000 mg | Freq: Four times a day (QID) | INTRAMUSCULAR | Status: DC | PRN
Start: 2014-02-01 — End: 2014-02-02
  Administered 2014-02-01: 12.5 mg via INTRAVENOUS
  Filled 2014-02-01: qty 1

## 2014-02-01 MED ORDER — SODIUM CHLORIDE 0.9 % IJ SOLN
INTRAMUSCULAR | Status: AC
  Filled 2014-02-01: qty 10

## 2014-02-01 MED ORDER — HYDROMORPHONE HCL PF 1 MG/ML IJ SOLN
INTRAMUSCULAR | Status: DC | PRN
  Administered 2014-02-01: 0.5 mg via INTRAVENOUS
  Administered 2014-02-01: .25 mg via INTRAVENOUS
  Administered 2014-02-01: 0.5 mg via INTRAVENOUS
  Administered 2014-02-01: .25 mg via INTRAVENOUS

## 2014-02-01 MED ORDER — DEXAMETHASONE SODIUM PHOSPHATE 10 MG/ML IJ SOLN
INTRAMUSCULAR | Status: AC
  Filled 2014-02-01: qty 1

## 2014-02-01 MED ORDER — LEVOTHYROXINE SODIUM 100 MCG PO TABS
100.0000 ug | ORAL_TABLET | Freq: Every day | ORAL | Status: DC
  Administered 2014-02-02 – 2014-02-04 (×2): 100 ug via ORAL
  Filled 2014-02-01 (×5): qty 1

## 2014-02-01 MED ORDER — METHYLENE BLUE 1 % INJ SOLN
INTRAMUSCULAR | Status: AC
  Filled 2014-02-01: qty 10

## 2014-02-01 MED ORDER — PROMETHAZINE HCL 25 MG/ML IJ SOLN
6.2500 mg | Freq: Once | INTRAMUSCULAR | Status: AC
  Administered 2014-02-01: 6.25 mg via INTRAVENOUS

## 2014-02-01 MED ORDER — SUCCINYLCHOLINE CHLORIDE 20 MG/ML IJ SOLN
INTRAMUSCULAR | Status: AC
  Filled 2014-02-01: qty 1

## 2014-02-01 MED ORDER — HYDROMORPHONE 0.3 MG/ML IV SOLN
INTRAVENOUS | Status: DC
  Administered 2014-02-01: 0.599 mg via INTRAVENOUS
  Administered 2014-02-01: 15:00:00 via INTRAVENOUS
  Administered 2014-02-02: 0.999 mg via INTRAVENOUS
  Administered 2014-02-02: 0.599 mg via INTRAVENOUS

## 2014-02-01 MED ORDER — DOCUSATE SODIUM 100 MG PO CAPS
100.0000 mg | ORAL_CAPSULE | Freq: Every day | ORAL | Status: DC
  Administered 2014-02-01 – 2014-02-04 (×3): 100 mg via ORAL
  Filled 2014-02-01 (×2): qty 1

## 2014-02-01 MED ORDER — PROPOFOL 10 MG/ML IV BOLUS
INTRAVENOUS | Status: DC | PRN
  Administered 2014-02-01: 200 mg via INTRAVENOUS

## 2014-02-01 MED ORDER — MIDAZOLAM HCL 2 MG/2ML IJ SOLN
INTRAMUSCULAR | Status: AC
  Filled 2014-02-01: qty 2

## 2014-02-01 MED ORDER — FENTANYL CITRATE 0.05 MG/ML IJ SOLN
INTRAMUSCULAR | Status: DC | PRN
  Administered 2014-02-01: 50 ug via INTRAVENOUS
  Administered 2014-02-01: 100 ug via INTRAVENOUS
  Administered 2014-02-01 (×2): 50 ug via INTRAVENOUS
  Administered 2014-02-01: 100 ug via INTRAVENOUS
  Administered 2014-02-01: 50 ug via INTRAVENOUS
  Administered 2014-02-01: 100 ug via INTRAVENOUS
  Administered 2014-02-01 (×2): 50 ug via INTRAVENOUS
  Administered 2014-02-01 (×4): 100 ug via INTRAVENOUS

## 2014-02-01 MED ORDER — VANCOMYCIN HCL IN DEXTROSE 1-5 GM/200ML-% IV SOLN
1000.0000 mg | Freq: Three times a day (TID) | INTRAVENOUS | Status: DC
  Administered 2014-02-01: 1000 mg via INTRAVENOUS
  Filled 2014-02-01 (×2): qty 200

## 2014-02-01 MED ORDER — SODIUM CHLORIDE 0.9 % IJ SOLN: 9.0000 mL | INTRAMUSCULAR | Status: DC | PRN

## 2014-02-01 MED ORDER — NEOSTIGMINE METHYLSULFATE 10 MG/10ML IV SOLN
INTRAVENOUS | Status: AC
  Filled 2014-02-01: qty 1

## 2014-02-01 MED ORDER — PROPOFOL 10 MG/ML IV BOLUS
INTRAVENOUS | Status: AC
  Filled 2014-02-01: qty 20

## 2014-02-01 MED ORDER — ACETAMINOPHEN 500 MG PO TABS: 500.0000 mg | ORAL_TABLET | Freq: Four times a day (QID) | ORAL | Status: DC | PRN

## 2014-02-01 MED ORDER — OXYCODONE HCL 5 MG PO TABS: 5.0000 mg | ORAL_TABLET | Freq: Once | ORAL | Status: DC | PRN

## 2014-02-01 MED ORDER — ONDANSETRON HCL 4 MG/2ML IJ SOLN
INTRAMUSCULAR | Status: AC
  Filled 2014-02-01: qty 2

## 2014-02-01 MED ORDER — HYDROMORPHONE 0.3 MG/ML IV SOLN
INTRAVENOUS | Status: AC
  Filled 2014-02-01: qty 25

## 2014-02-01 MED ORDER — STERILE WATER FOR INJECTION IJ SOLN
INTRAMUSCULAR | Status: AC
  Filled 2014-02-01: qty 10

## 2014-02-01 MED ORDER — PROMETHAZINE HCL 25 MG/ML IJ SOLN
INTRAMUSCULAR | Status: AC
  Administered 2014-02-01: 6.25 mg via INTRAVENOUS
  Filled 2014-02-01: qty 1

## 2014-02-01 MED ORDER — EPHEDRINE SULFATE 50 MG/ML IJ SOLN
INTRAMUSCULAR | Status: AC
  Filled 2014-02-01: qty 1

## 2014-02-01 MED ORDER — PHENYLEPHRINE HCL 10 MG/ML IJ SOLN
INTRAMUSCULAR | Status: DC | PRN
  Administered 2014-02-01: 40 ug via INTRAVENOUS

## 2014-02-01 MED ORDER — DEXAMETHASONE SODIUM PHOSPHATE 10 MG/ML IJ SOLN
INTRAMUSCULAR | Status: DC | PRN
  Administered 2014-02-01: 10 mg via INTRAVENOUS

## 2014-02-01 MED ORDER — ARTIFICIAL TEARS OP OINT
TOPICAL_OINTMENT | OPHTHALMIC | Status: DC | PRN
  Administered 2014-02-01: 1 via OPHTHALMIC

## 2014-02-01 MED ORDER — NALOXONE HCL 0.4 MG/ML IJ SOLN: 0.4000 mg | INTRAMUSCULAR | Status: DC | PRN

## 2014-02-01 MED ORDER — METHOCARBAMOL 500 MG PO TABS
ORAL_TABLET | ORAL | Status: AC
  Filled 2014-02-01: qty 1

## 2014-02-01 MED ORDER — LIOTHYRONINE SODIUM 25 MCG PO TABS
12.5000 ug | ORAL_TABLET | Freq: Two times a day (BID) | ORAL | Status: DC
  Administered 2014-02-01 – 2014-02-04 (×5): 12.5 ug via ORAL
  Filled 2014-02-01 (×11): qty 1

## 2014-02-01 MED ORDER — PANTOPRAZOLE SODIUM 40 MG PO TBEC
40.0000 mg | DELAYED_RELEASE_TABLET | Freq: Every day | ORAL | Status: DC
  Filled 2014-02-01: qty 1

## 2014-02-01 MED ORDER — OXYCODONE HCL 5 MG/5ML PO SOLN: 5.0000 mg | Freq: Once | ORAL | Status: DC | PRN

## 2014-02-01 SURGICAL SUPPLY — 103 items
ADH SKN CLS APL DERMABOND .7 (GAUZE/BANDAGES/DRESSINGS)
ADH SKN CLS LQ APL DERMABOND (GAUZE/BANDAGES/DRESSINGS) ×6
APPLIER CLIP 9.375 MED OPEN (MISCELLANEOUS) ×16
APR CLP MED 9.3 20 MLT OPN (MISCELLANEOUS) ×12
ATCH SMKEVC FLXB CAUT HNDSWH (FILTER) ×6 IMPLANT
BAG DECANTER FOR FLEXI CONT (MISCELLANEOUS) ×6 IMPLANT
BINDER BREAST LRG (GAUZE/BANDAGES/DRESSINGS) IMPLANT
BINDER BREAST XLRG (GAUZE/BANDAGES/DRESSINGS) ×1 IMPLANT
BIOPATCH RED 1 DISK 7.0 (GAUZE/BANDAGES/DRESSINGS) ×8 IMPLANT
BLADE 10 SAFETY STRL DISP (BLADE) ×4 IMPLANT
BLADE SURG 15 STRL LF DISP TIS (BLADE) ×3 IMPLANT
BLADE SURG 15 STRL SS (BLADE) ×4
CANISTER SUCTION 2500CC (MISCELLANEOUS) ×8 IMPLANT
CHLORAPREP W/TINT 26ML (MISCELLANEOUS) ×9 IMPLANT
CLIP APPLIE 9.375 MED OPEN (MISCELLANEOUS) ×6 IMPLANT
CONT SPEC 4OZ CLIKSEAL STRL BL (MISCELLANEOUS) ×4 IMPLANT
COUNTER NEEDLE 20 DBL MAG RED (NEEDLE) ×1 IMPLANT
COVER PROBE W GEL 5X96 (DRAPES) ×4 IMPLANT
COVER SURGICAL LIGHT HANDLE (MISCELLANEOUS) ×8 IMPLANT
DERMABOND ADHESIVE PROPEN (GAUZE/BANDAGES/DRESSINGS) ×2
DERMABOND ADVANCED (GAUZE/BANDAGES/DRESSINGS)
DERMABOND ADVANCED .7 DNX12 (GAUZE/BANDAGES/DRESSINGS) ×3 IMPLANT
DERMABOND ADVANCED .7 DNX6 (GAUZE/BANDAGES/DRESSINGS) IMPLANT
DEVICE DISSECT PLASMABLAD 3.0S (MISCELLANEOUS) IMPLANT
DRAIN CHANNEL 19F RND (DRAIN) ×12 IMPLANT
DRAPE INCISE 23X17 IOBAN STRL (DRAPES) ×1
DRAPE INCISE 23X17 STRL (DRAPES) ×3 IMPLANT
DRAPE INCISE IOBAN 23X17 STRL (DRAPES) ×3 IMPLANT
DRAPE LAPAROSCOPIC ABDOMINAL (DRAPES) ×4 IMPLANT
DRAPE ORTHO SPLIT 77X108 STRL (DRAPES) ×20
DRAPE PROXIMA HALF (DRAPES) ×8 IMPLANT
DRAPE SURG 17X23 STRL (DRAPES) ×17 IMPLANT
DRAPE SURG ORHT 6 SPLT 77X108 (DRAPES) ×12 IMPLANT
DRAPE UTILITY 15X26 W/TAPE STR (DRAPE) ×8 IMPLANT
DRAPE WARM FLUID 44X44 (DRAPE) ×5 IMPLANT
DRSG PAD ABDOMINAL 8X10 ST (GAUZE/BANDAGES/DRESSINGS) ×9 IMPLANT
DRSG SORBAVIEW 3.5X5-5/16 MED (GAUZE/BANDAGES/DRESSINGS) ×1 IMPLANT
ELECT BLADE 6.5 EXT (BLADE) IMPLANT
ELECT CAUTERY BLADE 6.4 (BLADE) ×11 IMPLANT
ELECT COATED BLADE 2.86 ST (ELECTRODE) ×4 IMPLANT
ELECT REM PT RETURN 9FT ADLT (ELECTROSURGICAL) ×8
ELECTRODE REM PT RTRN 9FT ADLT (ELECTROSURGICAL) ×6 IMPLANT
EVACUATOR SILICONE 100CC (DRAIN) ×12 IMPLANT
EVACUATOR SMOKE ACCUVAC VALLEY (FILTER) ×1
GAUZE XEROFORM 5X9 LF (GAUZE/BANDAGES/DRESSINGS) ×3 IMPLANT
GLOVE BIO SURGEON STRL SZ7 (GLOVE) ×1 IMPLANT
GLOVE BIO SURGEON STRL SZ7.5 (GLOVE) ×13 IMPLANT
GLOVE BIOGEL PI IND STRL 6.5 (GLOVE) IMPLANT
GLOVE BIOGEL PI IND STRL 7.0 (GLOVE) IMPLANT
GLOVE BIOGEL PI IND STRL 8 (GLOVE) ×6 IMPLANT
GLOVE BIOGEL PI INDICATOR 6.5 (GLOVE) ×1
GLOVE BIOGEL PI INDICATOR 7.0 (GLOVE) ×5
GLOVE BIOGEL PI INDICATOR 8 (GLOVE) ×3
GLOVE SURG SIGNA 7.5 PF LTX (GLOVE) ×1 IMPLANT
GLOVE SURG SS PI 6.5 STRL IVOR (GLOVE) ×1 IMPLANT
GOWN STRL REUS W/ TWL LRG LVL3 (GOWN DISPOSABLE) ×12 IMPLANT
GOWN STRL REUS W/ TWL XL LVL3 (GOWN DISPOSABLE) ×6 IMPLANT
GOWN STRL REUS W/TWL LRG LVL3 (GOWN DISPOSABLE) ×28
GOWN STRL REUS W/TWL XL LVL3 (GOWN DISPOSABLE) ×20
IMPL BREAST RND GEL 300CC (Breast) IMPLANT
IMPLANT BREAST RND GEL 300CC (Breast) ×4 IMPLANT
KIT BASIN OR (CUSTOM PROCEDURE TRAY) ×7 IMPLANT
KIT ROOM TURNOVER OR (KITS) ×7 IMPLANT
MARKER SKIN DUAL TIP RULER LAB (MISCELLANEOUS) ×5 IMPLANT
NDL 18GX1X1/2 (RX/OR ONLY) (NEEDLE) ×3 IMPLANT
NDL HYPO 25GX1X1/2 BEV (NEEDLE) ×3 IMPLANT
NEEDLE 18GX1X1/2 (RX/OR ONLY) (NEEDLE) IMPLANT
NEEDLE HYPO 25GX1X1/2 BEV (NEEDLE) IMPLANT
NS IRRIG 1000ML POUR BTL (IV SOLUTION) ×14 IMPLANT
PACK GENERAL/GYN (CUSTOM PROCEDURE TRAY) ×8 IMPLANT
PAD ABD 8X10 STRL (GAUZE/BANDAGES/DRESSINGS) ×1 IMPLANT
PAD ARMBOARD 7.5X6 YLW CONV (MISCELLANEOUS) ×13 IMPLANT
PENCIL BUTTON HOLSTER BLD 10FT (ELECTRODE) ×3 IMPLANT
PLASMABLADE 3.0S (MISCELLANEOUS) ×4
PREFILTER EVAC NS 1 1/3-3/8IN (MISCELLANEOUS) ×4 IMPLANT
SPECIMEN JAR X LARGE (MISCELLANEOUS) ×5 IMPLANT
SPONGE GAUZE 4X4 12PLY (GAUZE/BANDAGES/DRESSINGS) ×8 IMPLANT
SPONGE GAUZE 4X4 12PLY STER LF (GAUZE/BANDAGES/DRESSINGS) ×2 IMPLANT
SPONGE LAP 18X18 X RAY DECT (DISPOSABLE) IMPLANT
STAPLER VISISTAT 35W (STAPLE) ×6 IMPLANT
STRIP CLOSURE SKIN 1/2X4 (GAUZE/BANDAGES/DRESSINGS) ×3 IMPLANT
SUT ETHILON 3 0 FSL (SUTURE) ×5 IMPLANT
SUT MNCRL AB 3-0 PS2 18 (SUTURE) ×8 IMPLANT
SUT MNCRL AB 4-0 PS2 18 (SUTURE) ×1 IMPLANT
SUT MON AB 2-0 CT1 36 (SUTURE) ×2 IMPLANT
SUT MON AB 4-0 PC3 18 (SUTURE) ×3 IMPLANT
SUT PDS AB 0 CT 36 (SUTURE) ×9 IMPLANT
SUT PROLENE 2 0 FS (SUTURE) ×1 IMPLANT
SUT PROLENE 3 0 PS 1 (SUTURE) ×9 IMPLANT
SUT SILK 2 0 SH (SUTURE) ×2 IMPLANT
SUT VIC AB 3-0 54X BRD REEL (SUTURE) IMPLANT
SUT VIC AB 3-0 BRD 54 (SUTURE)
SUT VIC AB 3-0 FS2 27 (SUTURE) IMPLANT
SUT VIC AB 3-0 SH 18 (SUTURE) ×7 IMPLANT
SUT VIC AB 3-0 SH 8-18 (SUTURE) ×5 IMPLANT
SYR 50ML SLIP (SYRINGE) IMPLANT
SYR BULB IRRIGATION 50ML (SYRINGE) ×8 IMPLANT
SYR CONTROL 10ML LL (SYRINGE) ×3 IMPLANT
TOWEL OR 17X24 6PK STRL BLUE (TOWEL DISPOSABLE) ×8 IMPLANT
TOWEL OR 17X26 10 PK STRL BLUE (TOWEL DISPOSABLE) ×8 IMPLANT
TRAY FOLEY CATH 14FRSI W/METER (CATHETERS) ×3 IMPLANT
TUBE CONNECTING 12X1/4 (SUCTIONS) ×9 IMPLANT
TUBING BULK SUCTION (MISCELLANEOUS) ×1 IMPLANT

## 2014-02-01 NOTE — Brief Op Note (Signed)
02/01/2014  3:00 PM  PATIENT:  [name]  43 y.o. female  PRE-OPERATIVE DIAGNOSIS:  RIGHT BREAST CANCER   POST-OPERATIVE DIAGNOSIS:  RIGHT BREAST CANCER   PROCEDURE:  Procedure(s): LEFT  SIMPLE MASTECTOMY (Left) RIGHT MODIFIED MASTECTOMY WITH AXILLARY NODE DISSECTION (Right) LEFT LATISSIMUS FLAP TO BREAST WITH Silicone gel implant for immediate reconstruction. (Left)  SURGEON:  Surgeon(s) and Role: Panel 1:    * Harl Bowie, MD - [place] III, MD - Primary  Panel 2:    * Crissie Reese, MD - Primary  PHYSICIAN ASSISTANT:   ASSISTANTS: Jene Every, RNFA   ANESTHESIA:   general  EBL:  Total I/O In: 3000 [I.V.:3000] Out: 1065 [Urine:815; Blood:250]  BLOOD ADMINISTERED:none  DRAINS: (3) Jackson-Pratt drain(s) with closed bulb suction in the left bacj (2) and left chest (1)   LOCAL MEDICATIONS USED:  NONE  SPECIMEN:  No Specimen  DISPOSITION OF SPECIMEN:  N/A  COUNTS:  YES  TOURNIQUET:  * No tourniquets in log *  DICTATION: .Other Dictation: Dictation Number [id number]  PLAN OF CARE: Admit to inpatient   PATIENT DISPOSITION:  PACU - hemodynamically stable.   Delay start of Pharmacological VTE agent (>24hrs) due to surgical blood loss or risk of bleeding: no (she had subcu heparin pre-op)

## 2014-02-01 NOTE — OR Nursing (Signed)
Mastectomies start time: 8:11 - 9:50.

## 2014-02-01 NOTE — Progress Notes (Signed)
ANTIBIOTIC CONSULT NOTE - INITIAL  Pharmacy Consult for Vancomycin Indication: surgical prophlyaxis  Allergies  Allergen Reactions  . Imitrex [Sumatriptan Base] Anaphylaxis  . Amoxicillin Hives  . Bactrim Hives  . Cephalexin Hives  . Erythromycin Hives  . Penicillins Hives  . Strawberry Hives  . Sulfa Antibiotics Hives  . Zithromax [Azithromycin Dihydrate] Hives    Patient Measurements: Height: 5\' 2"  (157.5 cm) Weight: 222 lb 3.6 oz (100.8 kg) IBW/kg (Calculated) : 50.1  Vital Signs: Temp: 98.2 F (36.8 C) (05/21 1819) Temp src: Oral (05/21 1819) BP: 156/71 mmHg (05/21 1819) Pulse Rate: 103 (05/21 1819)   Recent Labs  01/30/14 1349 01/30/14 1439  WBC 12.8*  --   HGB 10.2*  --   PLT 157  --   CREATININE  --  0.7   Estimated Creatinine Clearance: 103.9 ml/min (by C-G formula based on Cr of 0.7).  Medical History: Past Medical History  Diagnosis Date  . PCO (polycystic ovaries)   . Allergic rhinitis     takes Zyrtec daily and nasal spray as needed  . Cervical dysplasia   . Cancer     ;Breast cancer; pre-cancerous cervical cells  . 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 Insomnia     takes Ambien nightly  . Anxiety     takes Ativan daily as needed  . Hypothyroid     takes Synthroid daily  . Acid reflux     takes Zantac nightly and Dexilant daily  . Family history of anesthesia complication     mom gets very sick  . History of bronchitis     many yrs ago  . Chronic headache     takes topamax nightly;last migraine in Dec 2014  . History of kidney stones    Assessment:   S/p bilateral mastectomies with left reconstruction.  Vancomycin 1 gram IV given pre-op; to continue vancomycin while in the hospital due to implanted breast prosthesis.  Multiple antibiotic allergies.  Goal of Therapy:  Vancomycin trough level 10-15 mcg/ml  Plan:   Vancomycin 1 gram IV q8hrs.  Will follow renal function; follow up for length of therapy.      Not  expecting any need to check vanc levels.   Arty Baumgartner, [place] Pager: [phone] 02/01/2014,7:20 PM

## 2014-02-01 NOTE — H&P (Signed)
Plastics Planned Procedure: Left latissimus flap with silicone implant for breast reconstruction

## 2014-02-01 NOTE — Transfer of Care (Signed)
Immediate Anesthesia Transfer of Care Note  Patient: Joan Valdez  Procedure(s) Performed: Procedure(s): LEFT  SIMPLE MASTECTOMY (Left) RIGHT MODIFIED MASTECTOMY WITH AXILLARY NODE DISSECTION (Right) LEFT LATISSIMUS FLAP TO BREAST WITH Silicone gel implant for immediate reconstruction. (Left)  Patient Location: PACU  Anesthesia Type:General  Level of Consciousness: awake, alert  and oriented  Airway & Oxygen Therapy: Patient Spontanous Breathing and Patient connected to nasal cannula oxygen  Post-op Assessment: Report given to PACU RN and Post -op Vital signs reviewed and stable  Post vital signs: Reviewed and stable  Complications: No apparent anesthesia complications

## 2014-02-01 NOTE — Interval H&P Note (Signed)
History and Physical Interval Note:  02/01/2014 6:59 AM  Joan Valdez  has presented today for surgery, with the diagnosis of RIGHT BREAST CANCER   The various methods of treatment have been discussed with the patient and family. After consideration of risks, benefits and other options for treatment, the patient has consented to  Procedure(s): TOTAL MASTECTOMY (Left) MASTECTOMY WITH axillary lymph node dissection right LEFT LATISSIMUS FLAP TO BREAST WITH IMPLANT (Left) as a surgical intervention .  The patient's history has been reviewed, patient examined, no change in status, stable for surgery.  I have reviewed the patient's chart and labs.  Questions were answered to the patient's satisfaction.     Joan Valdez

## 2014-02-01 NOTE — Anesthesia Postprocedure Evaluation (Signed)
  Anesthesia Post-op Note  Patient: Joan Valdez  Procedure(s) Performed: Procedure(s): LEFT  SIMPLE MASTECTOMY (Left) RIGHT MODIFIED MASTECTOMY WITH AXILLARY NODE DISSECTION (Right) LEFT LATISSIMUS FLAP TO BREAST WITH Silicone gel implant for immediate reconstruction. (Left)  Patient Location: PACU  Anesthesia Type:General  Level of Consciousness: awake, alert , oriented and patient cooperative  Airway and Oxygen Therapy: Patient Spontanous Breathing and Patient connected to nasal cannula oxygen  Post-op Pain: mild  Post-op Assessment: Post-op Vital signs reviewed, Patient's Cardiovascular Status Stable, Respiratory Function Stable, Patent Airway, No signs of Nausea or vomiting and Pain level controlled  Post-op Vital Signs: Reviewed and stable  Last Vitals:  Filed Vitals:   02/01/14 1524  BP:   Pulse:   Temp:   Resp: 13    Complications: No apparent anesthesia complications

## 2014-02-01 NOTE — H&P (View-Only) (Signed)
Subjective:     Patient ID: Joan Valdez, female   DOB: 08/05/1971, 42 y.o.   MRN: [id number]  HPI The patient is a 42-year-old white female with a locally advanced right breast cancer. She finished her chemotherapy on April 28th. She is a triple positive. She is now ready for bilateral mastectomies and right axillary lymph node dissection with left-sided reconstruction by Dr. Bowers. This is scheduled for May 21.  Review of Systems  Constitutional: Negative.   HENT: Negative.   Eyes: Negative.   Respiratory: Negative.   Cardiovascular: Negative.   Gastrointestinal: Negative.   Endocrine: Negative.   Genitourinary: Negative.   Musculoskeletal: Negative.   Skin: Negative.   Allergic/Immunologic: Negative.   Neurological: Negative.   Hematological: Negative.   Psychiatric/Behavioral: Negative.        Objective:   Physical Exam  Constitutional: She is oriented to person, place, and time. She appears well-developed and well-nourished.  HENT:  Head: Normocephalic and atraumatic.  Eyes: Conjunctivae and EOM are normal. Pupils are equal, round, and reactive to light.  Neck: Normal range of motion. Neck supple.  Cardiovascular: Normal rate, regular rhythm and normal heart sounds.   Pulmonary/Chest: Effort normal and breath sounds normal.  There is no palpable mass in either breast. There is no palpable axillary, supraclavicular, or cervical lymphadenopathy.  Abdominal: Soft. Bowel sounds are normal.  Musculoskeletal: Normal range of motion.  Lymphadenopathy:    She has no cervical adenopathy.  Neurological: She is alert and oriented to person, place, and time.  Skin: Skin is warm and dry.  Psychiatric: She has a normal mood and affect. Her behavior is normal.       Assessment:     The patient has a locally advanced right breast cancer that has responded well to neoadjuvant chemotherapy     Plan:     Plan for bilateral mastectomies and right axillary lymph node dissection  with left-sided reconstruction by Dr. Bowers on May 21. All questions were answered for her today. She is excited to get over to surgery       

## 2014-02-01 NOTE — Anesthesia Procedure Notes (Signed)
Procedure Name: Intubation Date/Time: 02/01/2014 7:47 AM Performed by: [place] Pink Pre-anesthesia Checklist: Patient identified, Emergency Drugs available, Suction available, Patient being monitored and Timeout performed Patient Re-evaluated:Patient Re-evaluated prior to inductionOxygen Delivery Method: Circle system utilized Preoxygenation: Pre-oxygenation with 100% oxygen Intubation Type: IV induction Ventilation: Mask ventilation without difficulty and Oral airway inserted - appropriate to patient size Laryngoscope Size: Mac and 3 Grade View: Grade I Tube type: Oral Tube size: 7.0 mm Number of attempts: 1 Airway Equipment and Method: Stylet Placement Confirmation: ETT inserted through vocal cords under direct vision,  positive ETCO2 and breath sounds checked- equal and bilateral Secured at: 20 cm Tube secured with: Tape Dental Injury: Teeth and Oropharynx as per pre-operative assessment

## 2014-02-01 NOTE — Op Note (Signed)
02/01/2014  10:10 AM  PATIENT:  Joan Valdez  43 y.o. female  PRE-OPERATIVE DIAGNOSIS:  RIGHT BREAST CANCER   POST-OPERATIVE DIAGNOSIS:  RIGHT BREAST CANCER   PROCEDURE:  Procedure(s): LEFT  SIMPLE MASTECTOMY (Left) RIGHT MODIFIED MASTECTOMY WITH AXILLARY NODE DISSECTION (Right) LEFT LATISSIMUS FLAP TO BREAST WITH IMPLANT (Left)  SURGEON:  Surgeon(s) and Role: Panel 1:    * Harl Bowie, MD - [place] III, MD - Primary  Panel 2:    * Crissie Reese, MD - Primary  PHYSICIAN ASSISTANT:   ASSISTANTS: Dr. Ninfa Linden   ANESTHESIA:   general  EBL:  Total I/O In: 1500 [I.V.:1500] Out: 245 [Urine:195; Blood:50]  BLOOD ADMINISTERED:none  DRAINS: (2) Jackson-Pratt drain(s) with closed bulb suction in the prepectoral space   LOCAL MEDICATIONS USED:  NONE  SPECIMEN:  Source of Specimen:  left breast, right breast with axillary contents  DISPOSITION OF SPECIMEN:  PATHOLOGY  COUNTS:  YES  TOURNIQUET:  * No tourniquets in log *  DICTATION: .Dragon Dictation After informed consent was obtained the patient was brought to the operating room and placed in the supine position on the operating room table. After adequate induction of general anesthesia the patient's bilateral chest, breast, and axillary areas were prepped with ChloraPrep, allowed to dry, and draped in usual sterile manner. Attention was first turned to the left breast. An elliptical incision was made around the nipple and areola complex in order to spare the skin. This incision was carried through the skin and subcutaneous tissue sharply with the plasma blade. Breast ducts were then used to elevate the skin flaps anteriorly towards the ceiling. Thin skin flaps were then created circumferentially around the incision between the subcutaneous fat and the breast tissue. This dissection was carried all the way to the chest wall. The breast was then removed from the pectoralis muscle the pectoralis fascia  from a top-down approach. Once the breast was removed it was oriented with a stitch on the lateral aspect. The breast was sent to pathology for further evaluation. The cavity was packed with a lap sponge moistened with antibiotic solution. The skin was loosely closed with staples. Attention was then turned to the right breast. A similar elliptical incision was made around the nipple and areola complex. The incision was carried through the skin and subcutaneous tissue sharply with the plasma blade. Breast hooks were then used to elevate the skin flaps anteriorly towards the ceiling. Thin skin flaps were created circumferentially between the subcutaneous tissue and the breast tissue. This dissection was carried all the way to the chest wall. Laterally the dissection was carried to the latissimus muscle. The breast was then removed from the chest wall with the pectoralis fascia. This was also done sharply with the plasma blade. Once the dissection was carried laterally to the axilla we were able to identify the serratus muscle medially, the latissimus muscle laterally, And the axillary vein superiorly. The contents of the axilla between these landmarks was then bluntly separated by a right angle dissection. Several small vessels were controlled with clips. Once the contents were free then the breast with the axillary contents were removed en bloc and sent to pathology for further evaluation. The wound was irrigated with copious amounts of saline. Hemostasis was achieved using the plasma blade. 2 small stab incisions were made in the anterior axillary line inferior to the operative area with a 15 blade knife. A tonsil clamp was placed through each of these  openings and used to bring a 19 [place] round Blake drain into the operative bed. The lateral drain was placed in the axilla. The medial drain was curled along the chest wall. The long thoracic nerve and thoracodorsal complex were identified and spared. The superior  and inferior flaps were then grossly reapproximated with interrupted 3-0 Vicryl stitches. The skin was then closed with a running 4-0 Monocryl subcuticular stitch. Dermabond dressings were applied. The drains were placed to bulb suction and had a good seal. Both incisions were then covered with a sterile adherent drape. At this point the operation was turned over to Dr. Harlow Mares for reconstruction. The patient had tolerated the surgery well. She was in stable condition at this point. Dr. Harlow Mares will dictate his portion of the case separately  PLAN OF CARE: Admit for overnight observation  PATIENT DISPOSITION:  PACU - hemodynamically stable.   Delay start of Pharmacological VTE agent (>24hrs) due to surgical blood loss or risk of bleeding: no

## 2014-02-01 NOTE — Anesthesia Preprocedure Evaluation (Addendum)
Anesthesia Evaluation  Patient identified by MRN, date of birth, ID band Patient awake    Reviewed: Allergy & Precautions, H&P , NPO status , Patient's Chart, lab work & pertinent test results  History of Anesthesia Complications Negative for: history of anesthetic complications  Airway Mallampati: II TM Distance: >3 FB Neck ROM: Full    Dental   Pulmonary neg pulmonary ROS,    Pulmonary exam normal       Cardiovascular negative cardio ROS      Neuro/Psych  Headaches, Anxiety    GI/Hepatic Neg liver ROS, GERD-  ,  Endo/Other  Hypothyroidism   Renal/GU negative Renal ROS     Musculoskeletal   Abdominal   Peds  Hematology   Anesthesia Other Findings   Reproductive/Obstetrics                          Anesthesia Physical Anesthesia Plan  ASA: II  Anesthesia Plan: General   Post-op Pain Management:    Induction: Intravenous  Airway Management Planned: Oral ETT  Additional Equipment:   Intra-op Plan:   Post-operative Plan: Extubation in OR  Informed Consent: I have reviewed the patients History and Physical, chart, labs and discussed the procedure including the risks, benefits and alternatives for the proposed anesthesia with the patient or authorized representative who has indicated his/her understanding and acceptance.   Dental advisory given  Plan Discussed with: CRNA, Anesthesiologist and Surgeon  Anesthesia Plan Comments:        Anesthesia Quick Evaluation

## 2014-02-02 ENCOUNTER — Encounter (HOSPITAL_COMMUNITY): Payer: Self-pay | Admitting: General Surgery

## 2014-02-02 LAB — BLOOD PRODUCT ORDER (VERBAL) VERIFICATION

## 2014-02-02 LAB — HEMOGLOBIN AND HEMATOCRIT, BLOOD
HEMATOCRIT: 28.2 % — AB (ref 36.0–46.0)
Hemoglobin: 9.3 g/dL — ABNORMAL LOW (ref 12.0–15.0)

## 2014-02-02 MED ORDER — HYDROMORPHONE HCL 2 MG PO TABS
2.0000 mg | ORAL_TABLET | ORAL | Status: DC | PRN
  Administered 2014-02-02: 4 mg via ORAL
  Filled 2014-02-02: qty 2

## 2014-02-02 MED ORDER — HYDROMORPHONE HCL 2 MG PO TABS: 4.0000 mg | ORAL_TABLET | ORAL | Status: DC | PRN

## 2014-02-02 MED ORDER — HEPARIN SODIUM (PORCINE) 5000 UNIT/ML IJ SOLN
5000.0000 [IU] | Freq: Three times a day (TID) | INTRAMUSCULAR | Status: DC
  Administered 2014-02-02 – 2014-02-04 (×7): 5000 [IU] via SUBCUTANEOUS
  Filled 2014-02-02 (×10): qty 1

## 2014-02-02 MED ORDER — METOCLOPRAMIDE HCL 10 MG PO TABS
10.0000 mg | ORAL_TABLET | Freq: Three times a day (TID) | ORAL | Status: DC
  Administered 2014-02-02 – 2014-02-04 (×5): 10 mg via ORAL
  Filled 2014-02-02 (×8): qty 1

## 2014-02-02 MED ORDER — HYDROMORPHONE HCL PF 1 MG/ML IJ SOLN
0.5000 mg | INTRAMUSCULAR | Status: DC | PRN
  Administered 2014-02-02 – 2014-02-03 (×4): 0.5 mg via INTRAVENOUS
  Filled 2014-02-02 (×4): qty 1

## 2014-02-02 NOTE — Progress Notes (Signed)
Subjective: Sore but good pain control. Some nausea relieved by Phenergan. Had some itching with Vancomycin and now on Doxycycline. Tolerating liquids.  Objective: Vital signs in last 24 hours: Temp:  [97.6 F (36.4 C)-98.2 F (36.8 C)] 97.9 F (36.6 C) (05/22 0524) Pulse Rate:  [85-118] 96 (05/22 0524) Resp:  [11-27] 18 (05/22 0524) BP: (125-156)/(60-92) 125/68 mmHg (05/22 0524) SpO2:  [96 %-100 %] 100 % (05/22 0524) Weight:  [222 lb 3.6 oz (100.8 kg)] 222 lb 3.6 oz (100.8 kg) (05/21 1819)  Intake/Output from previous day: 05/21 0701 - 05/22 0700 In: 3856.3 [I.V.:3856.3] Out: 2974 [BWLSL:3734; Drains:259; Blood:250] Intake/Output this shift:    Operative sites: Mastectomy flaps viable. Flap viable with good color throughout skin paddle. Implant in good position. Drains functioning. Drainage thin. There is no evidence of bleeding or infection.   Recent Labs  01/30/14 1349 01/30/14 1439  WBC 12.8*  --   HGB 10.2*  --   HCT 30.3*  --   NA  --  144  K  --  3.4*  CO2  --  20*  BUN  --  10.8  CREATININE  --  0.7    Studies/Results: No results found.  Assessment/Plan: Ambulate. D/C foley. Subcu heparin for DVT prophylaxis. Change to PO pain med.   LOS: 1 day    Joan Valdez 02/02/2014 7:48 AM

## 2014-02-02 NOTE — Op Note (Signed)
NAMEEDONA, SCHREFFLER NO.:  [id number]  MEDICAL RECORD NO.:  [id number]  LOCATION:                                 FACILITY:  PHYSICIAN:  Crissie Reese, M.D.     DATE OF BIRTH:  08-08-1971  DATE OF PROCEDURE:  02/01/2014 DATE OF DISCHARGE:                              OPERATIVE REPORT   PREOPERATIVE DIAGNOSIS:  Breast cancer.  POSTOPERATIVE DIAGNOSIS:  Breast cancer.  PROCEDURE PERFORMED: 1. Left latissimus myocutaneous flap to the left anterior chest. 2. Distinct procedure left immediate breast reconstruction with     silicone gel implant.  SURGEON:  Crissie Reese, M.D.  ANESTHESIA:  General.  ESTIMATED BLOOD LOSS:  60 mL.  DRAINS:  Two 19-French in the back and one 19-French in the left anterior chest.  CLINICAL NOTE:  A 43 year old woman who has breast cancer and is having bilateral mastectomy.  She will have radiation on the right side, and that reconstruction will be delayed.  She did desire the same operation on both sides, and because of radiation on the right, she will note autogenous tissue and therefore latissimus flap with implant as planned. She, therefore, decided she would like a latissimus flap with implant for the left breast reconstruction as well.  The nature of this procedure and risks, possible complications were discussed with her in detail.  She understood the nature of the procedure, and location of scars and risks include, but not limited to, bleeding, infection, healing problems, scarring, loss of sensation, fluid accumulations, pneumothorax, anesthesia-related complications, PE, DVT, failure of device, capsular contracture, displacement device, wrinkles and contour deformities especially at the periphery of the reconstruction and asymmetry as well as overall disappointment and loss of range of motion and strength in the left arm and loss of the flap or portions of the flap.  She understood all this and wished to proceed.  In  addition, she selected silicone gel implant and understood the FDA recommended an MRI every 2 years with silicone gel in place.  DESCRIPTION OF PROCEDURE:  The patient was in the operating room. Bilateral mastectomy was completed.  On the left side, the wound was covered with a sterile lap and a sterile large Steri-Drape was then placed over this.  The patient was then placed in a right lateral decubitus position with a shoulder roll and stabilized with a beanbag. She was then prepped with ChloraPrep and draped with sterile drapes including impervious drapes.  The skin paddle was incised and dissection carried down to the subcutaneous tissue beveling cephalad as well as inferiorly in order to ensure a broader attachment of the subcutaneous tissue at the level of the muscle that was present at the level of the skin.  This was done in order to preserve the blood flow and supply to the skin paddle.  Dissection was then carried out medial, lateral, superior and inferior in order to identify the borders of the latissimus muscle, and then the latissimus muscle was divided distally and then reflected in a cephalad direction.  This dissection was performed with electrocautery and large perforating vessels were either triple ligated with Ligaclips and divided or ligated with 3-0 Vicryl suture ligatures. Once the  flap had been completely mobilized, the subcutaneous tunnel was created through-and-through to the left anterior chest and the lap that was placed in this chest by the general surgeon was then removed, antibiotic solution was placed, antibiotic solution placed on the flap and the flap was passed gently through this subcutaneous tunnel to the anterior chest.  The donor defect was then irrigated thoroughly with saline as well as an antibiotic solution and meticulous hemostasis was achieved using electrocautery.  Two 19-French drains were positioned and brought out through separate stab  wounds inferoanteriorly and secured with 3-0 Prolene sutures.  The closure with 0 PDS interrupted inverted deep sutures and a 2-0 Monocryl interrupted inverted deep dermal sutures, 3-0 Monocryl running subcuticular suture and a few 2-0 Prolene simple interrupted sutures for reinforcement.  Dermabond was applied to the wound, Biopatch was used to the drains, and a dry sterile dressing to the back wound and she was then placed in a supine position.  Once in a supine position, the dressing had been placed, was then removed, and she was prepped with ChloraPrep, but for the wound itself that had been stapled closed by General Surgery.  This was prepped with Betadine.  She was then draped with sterile drapes again including impervious drapes.  The staples were removed.  The flap was inspected and found to have excellent color and bright red bleeding at the periphery consistent with viability.  Thorough irrigation with saline of the space and again meticulous hemostasis with the electrocautery.  Some antibiotic solution was also placed.  The muscle insetting 3-0 Vicryl interrupted sutures at the lateral aspect to close off the connection to the chest and then inferiorly the muscle was inset with 3-0 Vicryl sutures.  The silicone gel implant 329 mL was selected because she did want to be somewhat smaller than she was preoperatively.  This was soaked in antibiotic solution, and then after thoroughly cleaning gloves, the implant was positioned and the 3-0 Vicryl sutures were then used to complete the muscle closure.  This provided a complete muscular coverage of the implant.  Great care was taken to avoid damage to the implant which was kept under direct vision at all times.  Antibiotic solution was placed underneath the muscle just prior to placing the implant.  The skin of the mastectomy flaps were inspected and excellent hemostasis was confirmed.  A 19-French drain was positioned and  brought through separate stab wound inferiorly and secured with 3-0 Prolene suture and the skin paddle insetting with 3-0 Monocryl inverted deep dermal sutures.  A portion of the flap immediately was de-epithelialized and buried in order to augment that inferomedial aspect.  The flap had excellent color and bright red bleeding in its periphery consistent with viability.  The remainder of the skin paddle insetting with 3-0 Monocryl running subcuticular suture.  Dermabond was applied.  Biopatch and a dry sterile dressing around the drain and ABDs and the breast binder was then positioned and she was transferred to the recovery room in stable having tolerated the procedure well.  DISPOSITION:  She will be admitted as an inpatient.     Crissie Reese, M.D.     DB/MEDQ  D:  02/01/2014  T:  02/02/2014  Job:  [id number]

## 2014-02-02 NOTE — Progress Notes (Signed)
1 Day Post-Op  Subjective: Complains of pain and nausea  Objective: Vital signs in last 24 hours: Temp:  [97.6 F (36.4 C)-98.7 F (37.1 C)] 98.7 F (37.1 C) (05/22 0950) Pulse Rate:  [85-118] 100 (05/22 0950) Resp:  [11-27] 16 (05/22 0950) BP: (125-156)/(60-92) 141/62 mmHg (05/22 0950) SpO2:  [96 %-100 %] 97 % (05/22 0950) Weight:  [222 lb 3.6 oz (100.8 kg)] 222 lb 3.6 oz (100.8 kg) (05/21 1819) Last BM Date: 01/31/14  Intake/Output from previous day: 05/21 0701 - 05/22 0700 In: 3856.3 [I.V.:3856.3] Out: 2974 [FEOFH:2197; Drains:259; Blood:250] Intake/Output this shift: Total I/O In: 360 [P.O.:360] Out: 760 [Urine:650; Drains:110]  Resp: clear to auscultation bilaterally Chest wall: skin flaps look good Cardio: regular rate and rhythm GI: soft, non-tender; bowel sounds normal; no masses,  no organomegaly  Lab Results:   Recent Labs  01/30/14 1349 02/02/14 1020  WBC 12.8*  --   HGB 10.2* 9.3*  HCT 30.3* 28.2*  PLT 157  --    BMET  Recent Labs  01/30/14 1439  NA 144  K 3.4*  CO2 20*  GLUCOSE 112  BUN 10.8  CREATININE 0.7  CALCIUM 8.4   PT/INR No results found for this basename: LABPROT, INR,  in the last 72 hours ABG No results found for this basename: PHART, PCO2, PO2, HCO3,  in the last 72 hours  Studies/Results: No results found.  Anti-infectives: Anti-infectives   Start     Dose/Rate Route Frequency Ordered Stop   02/01/14 2300  doxycycline (VIBRA-TABS) tablet 100 mg     100 mg Oral Every 12 hours 02/01/14 2259     02/01/14 2000  vancomycin (VANCOCIN) IVPB 1000 mg/200 mL premix  Status:  Discontinued     1,000 mg 200 mL/hr over 60 Minutes Intravenous Every 8 hours 02/01/14 1918 02/01/14 2259   02/01/14 1406  polymyxin B 500,000 Units, bacitracin 50,000 Units in sodium chloride irrigation 0.9 % 500 mL irrigation  Status:  Discontinued       As needed 02/01/14 1406 02/01/14 1444   02/01/14 0600  vancomycin (VANCOCIN) IVPB 1000 mg/200 mL premix      1,000 mg 200 mL/hr over 60 Minutes Intravenous On call to O.R. 01/31/14 1409 02/01/14 0838   02/01/14 0540  vancomycin (VANCOCIN) IVPB 1000 mg/200 mL premix  Status:  Discontinued     1,000 mg 200 mL/hr over 60 Minutes Intravenous On call to O.R. 02/01/14 0540 02/01/14 1814      Assessment/Plan: s/p Procedure(s): LEFT  SIMPLE MASTECTOMY (Left) RIGHT MODIFIED MASTECTOMY WITH AXILLARY NODE DISSECTION (Right) LEFT LATISSIMUS FLAP TO BREAST WITH Silicone gel implant for immediate reconstruction. (Left) continue to work on pain control Advance diet once nausea improves  LOS: 1 day    Luella Cook III 02/02/2014

## 2014-02-02 NOTE — Progress Notes (Signed)
Nutrition Brief Note  Patient identified on the Malnutrition Screening Tool (MST) Report. Pt denies any significant weight changes or questions regarding nutrition. Currently eating well.  Wt Readings from Last 15 Encounters:  02/01/14 222 lb 3.6 oz (100.8 kg)  02/01/14 222 lb 3.6 oz (100.8 kg)  01/23/14 204 lb 8 oz (92.761 kg)  01/22/14 204 lb 8 oz (92.761 kg)  01/19/14 202 lb 12.8 oz (91.989 kg)  01/16/14 198 lb 11.2 oz (90.13 kg)  01/09/14 203 lb 4.8 oz (92.216 kg)  12/26/13 201 lb 9.6 oz (91.445 kg)  12/19/13 207 lb 8 oz (94.121 kg)  12/18/13 210 lb 9.6 oz (95.528 kg)  12/12/13 209 lb 12 oz (95.142 kg)  12/05/13 203 lb 6.4 oz (92.262 kg)  11/28/13 205 lb 4.8 oz (93.123 kg)  11/21/13 209 lb (94.802 kg)  11/07/13 202 lb (91.627 kg)    Body mass index is 40.63 kg/(m^2). Patient meets criteria for Obese Class III based on current BMI.   Current diet order is Regular, patient is consuming approximately 100% of meals at this time. Labs and medications reviewed.   No nutrition interventions warranted at this time. If nutrition issues arise, please consult RD.   Inda Coke MS, RD, LDN Inpatient Registered Dietitian Pager: [phone] After-hours pager: [phone]

## 2014-02-03 MED ORDER — SODIUM CHLORIDE 0.9 % IJ SOLN
10.0000 mL | INTRAMUSCULAR | Status: DC | PRN
  Administered 2014-02-04: 10 mL

## 2014-02-03 MED ORDER — FENTANYL CITRATE 0.05 MG/ML IJ SOLN: 12.5000 ug | INTRAMUSCULAR | Status: DC | PRN

## 2014-02-03 MED ORDER — ONDANSETRON HCL 4 MG/2ML IJ SOLN
4.0000 mg | Freq: Four times a day (QID) | INTRAMUSCULAR | Status: DC | PRN
  Administered 2014-02-03: 4 mg via INTRAVENOUS
  Filled 2014-02-03: qty 2

## 2014-02-03 MED ORDER — HYDROCODONE-ACETAMINOPHEN 5-325 MG PO TABS: 1.0000 | ORAL_TABLET | ORAL | Status: DC | PRN

## 2014-02-03 MED ORDER — ONDANSETRON HCL 4 MG/2ML IJ SOLN
INTRAMUSCULAR | Status: AC
  Filled 2014-02-03: qty 2

## 2014-02-03 MED ORDER — SIMETHICONE 80 MG PO CHEW
80.0000 mg | CHEWABLE_TABLET | Freq: Four times a day (QID) | ORAL | Status: DC | PRN
Start: 2014-02-03 — End: 2014-02-04
  Filled 2014-02-03: qty 1

## 2014-02-03 NOTE — Progress Notes (Signed)
2 Days Post-Op  Subjective: Stable and alert.Pain control is good, but nausea is a significant problem. Lying in bed and holding emesis basin. Has only vomited once. Notices some abdominal gas cramps, but has had 2 bowel movements.    Objective: Vital signs in last 24 hours: Temp:  [98 F (36.7 C)-99 F (37.2 C)] 98 F (36.7 C) (05/23 0500) Pulse Rate:  [100-122] 104 (05/23 0500) Resp:  [16-20] 20 (05/23 0500) BP: (129-154)/(60-67) 140/67 mmHg (05/23 0500) SpO2:  [97 %-100 %] 100 % (05/23 0500) Last BM Date: 01/31/14  Intake/Output from previous day: 05/22 0701 - 05/23 0700 In: 1832.5 [P.O.:360; I.V.:1472.5] Out: 1097 [Urine:651; Drains:445; Stool:1] Intake/Output this shift:    General appearance: Alert and cooperative. No significant physical distress. Depressed affect due to nausea. Doesn't feel well. Resp: clear to auscultation bilaterally Breasts: Left mastectomy skin flaps and latissimus flaps warm, pink, viable. Right mastectomy skin flaps warm, pink, viable. All 4 drains functioning with serosanguineous output.  Lab Results:  Results for orders placed during the hospital encounter of 02/01/14 (from the past 24 hour(s))  HEMOGLOBIN AND HEMATOCRIT, BLOOD     Status: Abnormal   Collection Time    02/02/14 10:20 AM      Result Value Ref Range   Hemoglobin 9.3 (*) 12.0 - 15.0 g/dL   HCT 28.2 (*) 36.0 - 46.0 %  BLOOD PRODUCT ORDER (VERBAL) VERIFICATION     Status: None   Collection Time    02/02/14  4:30 PM      Result Value Ref Range   Blood product order confirm MD AUTHORIZATION REQUESTED       Studies/Results: No results found.  . docusate sodium  100 mg Oral Daily  . doxycycline  100 mg Oral Q12H  . famotidine  20 mg Oral Daily  . heparin subcutaneous  5,000 Units Subcutaneous 3 times per day  . levothyroxine  100 mcg Oral QAC breakfast  . liothyronine  12.5 mcg Oral BID  . metoCLOPramide  10 mg Oral TID  . pantoprazole  40 mg Oral Daily  . potassium  chloride SA  20 mEq Oral BID  . topiramate  200 mg Oral QHS     Assessment/Plan: s/p Procedure(s): LEFT  SIMPLE MASTECTOMY RIGHT MODIFIED MASTECTOMY WITH AXILLARY NODE DISSECTION LEFT LATISSIMUS FLAP TO BREAST WITH Silicone gel implant for immediate reconstruction.  POD #1.Right modified radical mastectomy,Left total mastectomy with left latissimus flap and silicon gel implant for immediate reconstruction. Baseline all surgical wounds looked fine. Nausea remains a problem. Will discontinue Dilaudid and use hydrocodone or fentanyl to see if that helps. Ambulate more. Simethicone  for gassiness. Advance diet once nausea improved.  @PROBHOSP @  LOS: 2 days    Adin Hector 02/03/2014  . .prob

## 2014-02-03 NOTE — Progress Notes (Signed)
Subjective: Nauseated. Unable to eat. Pain well controlled and she is not requiring much pain medicine.  Objective: Vital signs in last 24 hours: Temp:  [98 F (36.7 C)-99 F (37.2 C)] 98 F (36.7 C) (05/23 0500) Pulse Rate:  [100-122] 104 (05/23 0500) Resp:  [16-20] 20 (05/23 0500) BP: (129-154)/(60-67) 140/67 mmHg (05/23 0500) SpO2:  [97 %-100 %] 100 % (05/23 0500)  Intake/Output from previous day: 05/22 0701 - 05/23 0700 In: 1832.5 [P.O.:360; I.V.:1472.5] Out: 1097 [Urine:651; Drains:445; Stool:1] Intake/Output this shift:    Operative sites: Mastectomy flaps viable. Flap viable with good color throughout skin paddle. Implant in good position. Drains functioning. Drainage thin. No evidence of bleeding or infection.   Recent Labs  02/02/14 1020  HGB 9.3*  HCT 28.2*    Studies/Results: No results found.  Assessment/Plan: Nausea may be due to Doxycycline. She is on Reglan, Phenergan, and Zofran. Will increase IV rate to 125 cc/hr.   LOS: 2 days    Joan Valdez 02/03/2014 9:33 AM

## 2014-02-04 LAB — TYPE AND SCREEN
ABO/RH(D): B POS
Antibody Screen: NEGATIVE
UNIT DIVISION: 0
Unit division: 0

## 2014-02-04 MED ORDER — SIMETHICONE 80 MG PO CHEW: 80.0000 mg | CHEWABLE_TABLET | Freq: Four times a day (QID) | ORAL | Status: DC | PRN

## 2014-02-04 MED ORDER — PROMETHAZINE HCL 25 MG PO TABS: 25.0000 mg | ORAL_TABLET | Freq: Four times a day (QID) | ORAL | Status: DC | PRN

## 2014-02-04 MED ORDER — ENOXAPARIN SODIUM 40 MG/0.4ML ~~LOC~~ SOLN: 40.0000 mg | SUBCUTANEOUS | Status: DC

## 2014-02-04 MED ORDER — HYDROCODONE-ACETAMINOPHEN 5-325 MG PO TABS: 1.0000 | ORAL_TABLET | ORAL | Status: DC | PRN

## 2014-02-04 MED ORDER — DSS 100 MG PO CAPS: 100.0000 mg | ORAL_CAPSULE | Freq: Every day | ORAL | Status: DC

## 2014-02-04 MED ORDER — PROMETHAZINE HCL 25 MG PO TABS
25.0000 mg | ORAL_TABLET | Freq: Four times a day (QID) | ORAL | Status: DC | PRN
  Administered 2014-02-04: 25 mg via ORAL
  Filled 2014-02-04: qty 1

## 2014-02-04 MED ORDER — HEPARIN SOD (PORK) LOCK FLUSH 100 UNIT/ML IV SOLN
500.0000 [IU] | INTRAVENOUS | Status: AC | PRN
  Administered 2014-02-04: 500 [IU]

## 2014-02-04 MED ORDER — METOCLOPRAMIDE HCL 10 MG PO TABS: 10.0000 mg | ORAL_TABLET | Freq: Three times a day (TID) | ORAL | Status: DC

## 2014-02-04 NOTE — Progress Notes (Signed)
Patient, husband and family all given discharge instructions.  They verbalized understanding of all discharge instructions and follow-up information.  Husband demonstrated emptying JP drains, recording output, dressing change around the drain, and milking the drain (if necessary).  Dressing on pt's back changed per Dr Harlow Mares instruction.  Also instructed family about Lovenox injections.  Gave them printed information on that as well.   No further questions or concerns at this time.  Pt ready for discharge home.  Taken down via wheelchair.

## 2014-02-04 NOTE — Discharge Instructions (Addendum)
No lifting for 6 weeks No vigorous activity for 6 weeks (including outdoor walks) No driving for 4 weeks OK to walk up stairs slowly Stay propped up Use incentive spirometer at home every hour while awake No shower while drains are in place Empty drains at least three times a day and record the amounts separately Change drain dressings every third day if instructed to do so by Dr. Harlow Mares  Apply Bacitracin antibiotic ointment to the drain sites  Place gauze dressing over drains  Secure the gauze with tape Take an over-the-counter stool softener (such as Colace) while on pain medication Begin Lovenox shots at home tomorrow morning See Dr. Harlow Mares as scheduled in office this upcoming week For questions call [phone] or [phone]

## 2014-02-04 NOTE — Discharge Summary (Signed)
Physician Discharge Summary  Patient ID: Joan Valdez MRN: [id number] DOB/AGE: 1971-06-25 43 y.o.  Admit date: 02/01/2014 Discharge date: 02/04/2014  Admission Diagnoses: Breast cancer  Discharge Diagnoses: Same Active Problems:   Breast cancer   Discharged Condition: good  Hospital Course: On the day of admission the patient was taken to surgery and had bilateral mastectomy, right axillary dissection, and left breast reconstruction with latissimus flap and implant. The patient tolerated the procedures well. Postoperatively, the flap maintained excellent color and capillary refill. The patient was ambulatory and tolerating liquids on the first postoperative day. Although she continued to do well in terms of the surgical sites, she became more nauseated. Various pain regimens and nausea regimens were tried. On the third post-op day she was improved and felt she was ready to go home.  Significant Diagnostic Studies: labs: Hgb 9.3 post-op.  Treatments: antibiotics: vancomycin, anticoagulation: heparin and surgery: right modified radical mastectomy, left total mastectomy, left reconstruction with latissimus flap and implant.  Discharge Exam: Blood pressure 157/84, pulse 132, temperature 97.9 F (36.6 C), temperature source Oral, resp. rate 18, height 5\' 2"  (1.575 m), weight 222 lb 3.6 oz (100.8 kg), last menstrual period 09/04/2013, SpO2 100.00%.  Operative sites: Mastectomy flaps viable. Flap viable. Implant on left in good position. Drains functioning. Drainage thin. No evidence of bleeding or infection at any site..  Disposition: 01-Home or Self Care     Medication List    ASK your doctor about these medications       acetaminophen 500 MG tablet  Commonly known as:  TYLENOL  Take 500 mg by mouth every 6 (six) hours as needed for mild pain.     cetirizine 10 MG tablet  Commonly known as:  ZYRTEC  Take 10 mg by mouth at bedtime as needed for allergies.     CYTOMEL 25 MCG  tablet  Generic drug:  liothyronine  Take 12.5 mcg by mouth 2 (two) times daily.     dexamethasone 4 MG tablet  Commonly known as:  DECADRON     DEXILANT 60 MG capsule  Generic drug:  dexlansoprazole  Take 60 mg by mouth daily.     JUICE PLUS FIBRE PO  Take 3 tablets by mouth daily.     levothyroxine 100 MCG tablet  Commonly known as:  SYNTHROID, LEVOTHROID  Take 100 mcg by mouth daily.     lidocaine-prilocaine cream  Commonly known as:  EMLA  Apply 1 application topically once as needed (if need for port).     LORazepam 0.5 MG tablet  Commonly known as:  ATIVAN  Take 1 tablet (0.5 mg total) by mouth every 6 (six) hours as needed for anxiety.     MIGRANAL 4 MG/ML nasal spray  Generic drug:  dihydroergotamine  Place 1 spray into the nose as needed for migraine. Use in one nostril as directed.  No more than 4 sprays in one hour     mometasone 50 MCG/ACT nasal spray  Commonly known as:  NASONEX  Place 2 sprays into both nostrils daily as needed (allergies).     ondansetron 8 MG tablet  Commonly known as:  ZOFRAN  Take 8 mg by mouth See admin instructions. Take 1 tablet twice a day starting the day after chemo for three days, then as needed nausea and vomiting symptoms     potassium chloride SA 20 MEQ tablet  Commonly known as:  K-DUR,KLOR-CON  Take 20 mEq by mouth 2 (two) times daily. For 5 days  ranitidine 300 MG tablet  Commonly known as:  ZANTAC  Take 300 mg by mouth at bedtime.     topiramate 100 MG tablet  Commonly known as:  TOPAMAX  Take 200 mg by mouth at bedtime.     VANIQA 13.9 % cream  Generic drug:  Eflornithine HCl  Apply 1 application topically daily.     ZOLADEX [place]  Inject into the skin every 30 (thirty) days. Next injection due on May 12     zolpidem 10 MG tablet  Commonly known as:  AMBIEN  Take 10 mg by mouth at bedtime.         Signed: Crissie Reese 02/04/2014, 10:34 AM

## 2014-02-04 NOTE — Progress Notes (Signed)
3 Days Post-Op  Subjective: Alert. Tolerating diet a little better without vomiting, does say she still nauseated.  Ambulated once.. Had a bowel movement. Not having much pain ; she hasn't taken any pain medicine of any kind in 24 hours.  Objective: Vital signs in last 24 hours: Temp:  [98.1 F (36.7 C)-98.4 F (36.9 C)] 98.1 F (36.7 C) (05/24 0208) Pulse Rate:  [92-138] 92 (05/24 0208) Resp:  [18-20] 18 (05/24 0208) BP: (141-152)/(62-74) 141/68 mmHg (05/24 0208) SpO2:  [98 %-99 %] 98 % (05/24 0208) Last BM Date: 01/31/14  Intake/Output from previous day: 05/23 0701 - 05/24 0700 In: 2748.3 [P.O.:360; I.V.:2388.3] Out: 290 [Drains:290] Intake/Output this shift: Total I/O In: 2388.3 [I.V.:2388.3] Out: 120 [Drains:120]   EXAM: General appearance: alert. Stable. Mental status normal. Slow to move. Breasts:  right mastectomy skin flaps warm,, viable, no necrosis or fluid collection. Left mastectomy skin flaps and latissimus paddle pink and warm without any necrosis or infection..All drains functioning. Drainage thin, serosanguineous  Lab Results:  No results found for this or any previous visit (from the past 24 hour(s)).   Studies/Results: No results found.  . docusate sodium  100 mg Oral Daily  . famotidine  20 mg Oral Daily  . heparin subcutaneous  5,000 Units Subcutaneous 3 times per day  . levothyroxine  100 mcg Oral QAC breakfast  . liothyronine  12.5 mcg Oral BID  . metoCLOPramide  10 mg Oral TID  . pantoprazole  40 mg Oral Daily  . potassium chloride SA  20 mEq Oral BID  . topiramate  200 mg Oral QHS     Assessment/Plan: s/p Procedure(s): LEFT  SIMPLE MASTECTOMY RIGHT MODIFIED MASTECTOMY WITH AXILLARY NODE DISSECTION LEFT LATISSIMUS FLAP TO BREAST WITH Silicone gel implant for immediate reconstruction.  POD #3.    Right modified radical mastectomy,Left total mastectomy with left latissimus flap and silicon gel implant for immediate reconstruction. Baseline  all surgical wounds looked fine.  Nausea remains a problem, but better and no vomiting. Ambulate more.  Simethicone for gassiness.  Diet as tol. Disposition per Dr. Harlow Mares. Await pathology   @PROBHOSP @  LOS: 3 days    Adin Hector 02/04/2014  . .prob

## 2014-02-08 ENCOUNTER — Other Ambulatory Visit (INDEPENDENT_AMBULATORY_CARE_PROVIDER_SITE_OTHER): Payer: Self-pay

## 2014-02-08 DIAGNOSIS — C50911 Malignant neoplasm of unspecified site of right female breast: Secondary | ICD-10-CM

## 2014-02-12 ENCOUNTER — Encounter: Payer: Self-pay | Admitting: Oncology

## 2014-02-12 NOTE — Progress Notes (Signed)
Insurance is paying [phone]- hercp 100%

## 2014-02-13 ENCOUNTER — Telehealth (INDEPENDENT_AMBULATORY_CARE_PROVIDER_SITE_OTHER): Payer: Self-pay | Admitting: General Surgery

## 2014-02-13 ENCOUNTER — Encounter: Payer: Self-pay | Admitting: Oncology

## 2014-02-13 NOTE — Progress Notes (Signed)
Put disability papers in registration desk.

## 2014-02-13 NOTE — Telephone Encounter (Signed)
Spoke with Melissa at Dr. Harlow Mares office to explain that Dr. Marlou Starks would like to walk down to the office to eval this mutual patient and so he can pull his last drain.  Melissa gave the okay.  The patient was also informed of this plan.

## 2014-02-14 NOTE — Progress Notes (Signed)
Follow up New Consult:Lower outer quadrant right breast 08/16/13  Histology per Pathology Report: 02/01/14:  Diagnosis 1. Breast, simple mastectomy, Left stitch on lateral aspect- BENIGN BREAST TISSUE, SEE COMMENT.- NEGATIVE FOR ATYPIA OR MALIGNANCY.- SURGICAL MARGINS, NEGATIVE FOR ATYPIA OR MALIGNANCY.  2. 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 Lymph node, biopsy, Additional right axillary- ONE LYMPH NODE, NEGATIVE FOR TUMOR (0/1)  Dr.   09/12/13:Diagnosis Breast, left, needle core biopsy, 6 o'clock- FIBROCYSTIC CHANGES WITH USUAL DUCTAL HYPERPLASIA, Dr.Paul Toth  08/16/13:Diagnosis1. Breast, right, needle core biopsy, mass, 6 o'clock, 5 cm fn- INVASIVE DUCTAL CARCINOMA.  - DUCTAL CARCINOMA IN SITU WITH NECROSIS.SEE COMMENT.2. Lymph node, needle/core biopsy, right axilla- POSITIVE FOR DUCTAL CARCINOMA.Dr. Autumn Messing  Receptor Status: ER( + ), PR ( + ), Her2-neu ( + )  Did patient present with symptoms (if so, please note symptoms) or was this found on screening mammography?: routine screening mammogram Mass found at 6 0'clock postion   Past/Anticipated interventions by surgeon, if any : PROCEDURE: Procedure(s): 02/01/14: LEFT SIMPLE MASTECTOMY (Left)  RIGHT MODIFIED MASTECTOMY WITH AXILLARY NODE DISSECTION (Right)  LEFT LATISSIMUS FLAP TO BREAST WITH Silicone gel implant for immediate reconstruction. (Left)  SURGEON: Surgeon(s) and Role:  Panel 1:  * Harl Bowie, MD - [place] III, MD - Primary   Follow up with Dr. Autumn Messing III 02/23/14  Past/Anticipated interventions by medical oncology, if any: Chemotherapy s/p TCH/perjeta cycle 1 day 1 chemotherapy completed 09/19/12,  appt covering MD 10/02/13, Dr.Khan appt 10/10/13 with infusion also  Lymphedema issues, if any: no   Pain issues, if any: 5/10 scale b/l,   Breasts, incision left breast flap well healing, J/P drain still left side, seroserous dranage in bulb about 50cc, patient has drained 12 cc this am,  \Prior radiation?NO  Pacemaker/ICD? }NO  Possible current pregnancy?NO  Is the patient on methotrexate? NO Married, 1 child,  1st pregnancy age 56, GxP1, menses age 6, period stopped from Zoledex, still taking every 30 days, alopecia No family hx breast or ovarian cancer

## 2014-02-15 ENCOUNTER — Ambulatory Visit: Payer: BC Managed Care – PPO

## 2014-02-16 ENCOUNTER — Ambulatory Visit: Payer: BC Managed Care – PPO

## 2014-02-19 ENCOUNTER — Ambulatory Visit
Admission: RE | Admit: 2014-02-19 | Discharge: 2014-02-19 | Disposition: A | Discharge status: Home / Self Care | Payer: BC Managed Care – PPO | Source: Ambulatory Visit | Attending: Radiation Oncology | Admitting: Radiation Oncology

## 2014-02-19 ENCOUNTER — Encounter: Payer: Self-pay | Admitting: Radiation Oncology

## 2014-02-19 VITALS — BP 146/95 | HR 98 | Temp 97.8°F | Resp 20 | Ht 62.0 in | Wt 202.7 lb

## 2014-02-19 DIAGNOSIS — C50919 Malignant neoplasm of unspecified site of unspecified female breast: Secondary | ICD-10-CM | POA: Diagnosis not present

## 2014-02-19 DIAGNOSIS — Z510 Encounter for antineoplastic radiation therapy: Secondary | ICD-10-CM | POA: Diagnosis not present

## 2014-02-19 DIAGNOSIS — Z9221 Personal history of antineoplastic chemotherapy: Secondary | ICD-10-CM | POA: Diagnosis not present

## 2014-02-19 DIAGNOSIS — C50511 Malignant neoplasm of lower-outer quadrant of right female breast: Secondary | ICD-10-CM

## 2014-02-19 DIAGNOSIS — C50319 Malignant neoplasm of lower-inner quadrant of unspecified female breast: Secondary | ICD-10-CM

## 2014-02-19 NOTE — Progress Notes (Signed)
Please see the Nurse Progress Note in the MD Initial Consult Encounter for this patient. 

## 2014-02-19 NOTE — Progress Notes (Signed)
Radiation Oncology         [phone] ________________________________  Name: Joan Valdez MRN: [id number]  Date: 02/19/2014  DOB: 1971/02/23  Follow-Up Visit Note  CC: [name], MD  Susy Frizzle, MD  Diagnosis:   Invasive ductal carcinoma of the right breast  Narrative:  The patient returns today for routine follow-up.  The patient was originally seen in multidisciplinary clinic. She was felt to be a good candidate for breast conservation treatment. She has completed surgery consisting of a lumpectomy. Final pathology revealed a [id number] tumor with negative margins. The patient has also completed chemotherapy neoadjuvantly. She states that she did well during chemotherapy. She states that it was easier than she thought it would be. She has had some ongoing discomfort postoperatively. Overall however, The patient has done satisfactorily postoperatively. She is appropriate to proceed with adjuvant radiation treatment at this time.  ALLERGIES:  is allergic to imitrex; amoxicillin; bactrim; cephalexin; erythromycin; penicillins; strawberry; sulfa antibiotics; vancomycin; and zithromax.  Meds: Current Outpatient Prescriptions  Medication Sig Dispense Refill  . dexlansoprazole (DEXILANT) 60 MG capsule Take 60 mg by mouth daily.      Marland Kitchen dihydroergotamine (MIGRANAL) 4 MG/ML nasal spray Place 1 spray into the nose as needed for migraine. Use in one nostril as directed.  No more than 4 sprays in one hour      . Eflornithine HCl (VANIQA) 13.9 % cream Apply 1 application topically daily.      . Goserelin Acetate (ZOLADEX [place]) Inject into the skin every 30 (thirty) days. Next injection due on May 12      . HYDROcodone-acetaminophen (NORCO/VICODIN) 5-325 MG per tablet Take 1-2 tablets by mouth every 4 (four) hours as needed for moderate pain.  30 tablet  0  . levothyroxine (SYNTHROID, LEVOTHROID) 100 MCG tablet Take 100 mcg by mouth daily.       Marland Kitchen lidocaine-prilocaine (EMLA) cream Apply  1 application topically once as needed (if need for port).      Marland Kitchen liothyronine (CYTOMEL) 25 MCG tablet Take 12.5 mcg by mouth 2 (two) times daily.       Marland Kitchen LORazepam (ATIVAN) 0.5 MG tablet Take 1 tablet (0.5 mg total) by mouth every 6 (six) hours as needed for anxiety.  30 tablet  0  . mometasone (NASONEX) 50 MCG/ACT nasal spray Place 2 sprays into both nostrils daily as needed (allergies).       . Nutritional Supplements (JUICE PLUS FIBRE PO) Take 3 tablets by mouth daily.       . ranitidine (ZANTAC) 300 MG tablet Take 300 mg by mouth at bedtime.      . topiramate (TOPAMAX) 100 MG tablet Take 200 mg by mouth at bedtime.      Marland Kitchen zolpidem (AMBIEN) 10 MG tablet Take 10 mg by mouth at bedtime.      Marland Kitchen EPIPEN 2-PAK 0.3 MG/0.3ML SOAJ injection        No current facility-administered medications for this encounter.    Physical Findings: The patient is in no acute distress. Patient is alert and oriented.  height is 5\' 2"  (1.575 m) and weight is 202 lb 11.2 oz (91.944 kg). Her oral temperature is 97.8 F (36.6 C). Her blood pressure is 146/95 and her pulse is 98. Her respiration is 20. .   The patient is s/p lumpectomy. The surgical incision is healing well.  Lab Findings: Lab Results  Component Value Date   WBC 12.8* 01/30/2014   HGB 9.3* 02/02/2014   HCT  28.2* 02/02/2014   MCV 106.6* 01/30/2014   PLT 157 01/30/2014     Radiographic Findings: No results found.  Impression:    The patient is status post lumpectomy and chemotherapy as part of her breast conservation treatment strategy. The patient is appropriate to proceed with adjuvant radiation treatment at this time.  I discussed with the patient the role of radiation treatment in this setting. We discussed the expected benefit in terms of local/regional control area we also discussed the possible side effects and risks of treatment. All of her questions were answered.  We also discussed the logistics of treatment. The patient wishes to  proceed with simulation.  Plan:  The patient will be scheduled for a simulation in the near future such that we can begin treatment planning. I anticipate treating the patient with a  6-1/2 week course of radiation treatment. This will correspond to  postmastectomy radiation  treatment to the  right breast using a 4 field approach.   Jodelle Gross, M.D., Ph.D.

## 2014-02-20 ENCOUNTER — Ambulatory Visit (HOSPITAL_BASED_OUTPATIENT_CLINIC_OR_DEPARTMENT_OTHER): Payer: BC Managed Care – PPO

## 2014-02-20 ENCOUNTER — Ambulatory Visit: Payer: BC Managed Care – PPO

## 2014-02-20 VITALS — BP 131/76 | HR 105 | Temp 97.9°F | Resp 18

## 2014-02-20 DIAGNOSIS — Z5111 Encounter for antineoplastic chemotherapy: Secondary | ICD-10-CM

## 2014-02-20 DIAGNOSIS — C773 Secondary and unspecified malignant neoplasm of axilla and upper limb lymph nodes: Secondary | ICD-10-CM

## 2014-02-20 DIAGNOSIS — Z5112 Encounter for antineoplastic immunotherapy: Secondary | ICD-10-CM

## 2014-02-20 DIAGNOSIS — C50511 Malignant neoplasm of lower-outer quadrant of right female breast: Secondary | ICD-10-CM

## 2014-02-20 DIAGNOSIS — C50519 Malignant neoplasm of lower-outer quadrant of unspecified female breast: Secondary | ICD-10-CM

## 2014-02-20 MED ORDER — DIPHENHYDRAMINE HCL 25 MG PO CAPS
50.0000 mg | ORAL_CAPSULE | Freq: Once | ORAL | Status: AC
  Administered 2014-02-20: 50 mg via ORAL

## 2014-02-20 MED ORDER — TRASTUZUMAB CHEMO INJECTION 440 MG
6.0000 mg/kg | Freq: Once | INTRAVENOUS | Status: AC
  Administered 2014-02-20: 546 mg via INTRAVENOUS
  Filled 2014-02-20: qty 26

## 2014-02-20 MED ORDER — HEPARIN SOD (PORK) LOCK FLUSH 100 UNIT/ML IV SOLN
500.0000 [IU] | Freq: Once | INTRAVENOUS | Status: AC | PRN
  Administered 2014-02-20: 500 [IU]
  Filled 2014-02-20: qty 5

## 2014-02-20 MED ORDER — SODIUM CHLORIDE 0.9 % IV SOLN
Freq: Once | INTRAVENOUS | Status: AC
  Administered 2014-02-20: 09:00:00 via INTRAVENOUS

## 2014-02-20 MED ORDER — ACETAMINOPHEN 325 MG PO TABS
650.0000 mg | ORAL_TABLET | Freq: Once | ORAL | Status: AC
  Administered 2014-02-20: 650 mg via ORAL

## 2014-02-20 MED ORDER — GOSERELIN ACETATE 3.6 MG ~~LOC~~ IMPL
3.6000 mg | DRUG_IMPLANT | Freq: Once | SUBCUTANEOUS | Status: AC
  Administered 2014-02-20: 3.6 mg via SUBCUTANEOUS
  Filled 2014-02-20: qty 3.6

## 2014-02-20 MED ORDER — DIPHENHYDRAMINE HCL 25 MG PO CAPS
ORAL_CAPSULE | ORAL | Status: AC
  Filled 2014-02-20: qty 2

## 2014-02-20 MED ORDER — ACETAMINOPHEN 325 MG PO TABS
ORAL_TABLET | ORAL | Status: AC
  Filled 2014-02-20: qty 2

## 2014-02-20 MED ORDER — SODIUM CHLORIDE 0.9 % IJ SOLN
10.0000 mL | INTRAMUSCULAR | Status: DC | PRN
  Administered 2014-02-20: 10 mL
  Filled 2014-02-20: qty 10

## 2014-02-20 NOTE — Patient Instructions (Signed)
[facility] Discharge Instructions for Patients Receiving Chemotherapy  Today you received the following chemotherapy agents Herceptin  To help prevent nausea and vomiting after your treatment, we encourage you to take your nausea medication     If you develop nausea and vomiting that is not controlled by your nausea medication, call the clinic.   BELOW ARE SYMPTOMS THAT SHOULD BE REPORTED IMMEDIATELY:  *FEVER GREATER THAN 100.5 F  *CHILLS WITH OR WITHOUT FEVER  NAUSEA AND VOMITING THAT IS NOT CONTROLLED WITH YOUR NAUSEA MEDICATION  *UNUSUAL SHORTNESS OF BREATH  *UNUSUAL BRUISING OR BLEEDING  TENDERNESS IN MOUTH AND THROAT WITH OR WITHOUT PRESENCE OF ULCERS  *URINARY PROBLEMS  *BOWEL PROBLEMS  UNUSUAL RASH Items with * indicate a potential emergency and should be followed up as soon as possible.  Feel free to call the clinic you have any questions or concerns. The clinic phone number is [phone].    

## 2014-02-23 ENCOUNTER — Encounter (INDEPENDENT_AMBULATORY_CARE_PROVIDER_SITE_OTHER): Payer: Self-pay | Admitting: General Surgery

## 2014-02-23 ENCOUNTER — Ambulatory Visit (INDEPENDENT_AMBULATORY_CARE_PROVIDER_SITE_OTHER): Payer: BC Managed Care – PPO | Admitting: General Surgery

## 2014-02-23 VITALS — BP 132/82 | HR 100 | Temp 97.5°F | Resp 16 | Ht 62.0 in | Wt 201.0 lb

## 2014-02-23 DIAGNOSIS — C50511 Malignant neoplasm of lower-outer quadrant of right female breast: Secondary | ICD-10-CM

## 2014-02-23 DIAGNOSIS — C50519 Malignant neoplasm of lower-outer quadrant of unspecified female breast: Secondary | ICD-10-CM

## 2014-02-23 NOTE — Progress Notes (Signed)
Subjective:     Patient ID: Joan Valdez, female   DOB: December 17, 1970, 43 y.o.   MRN: [id number]  HPI The patient is a 43 year old white female who had a locally advanced right breast cancer. She underwent neoadjuvant chemotherapy. She is now 2 weeks status post right modified radical mastectomy and left simple mastectomy with latissimus reconstruction. On her final pathology there was no residual invasive cancer. Only DCIS was found. The nodes were negative but they did have chemotherapy effect. Her pain is gradually improving.  Review of Systems     Objective:   Physical Exam On exam her right mastectomy incision is healing nicely with no sign of infection or seroma. Her skin flaps are healthy. Her left latissimus reconstruction looks very good. She still has one remaining drain on the left side.    Assessment:     The patient is 2 weeks status post right modified radical mastectomy and left simple mastectomy for a locally advanced right breast cancer     Plan:     At this point she will continue to record the output from her remaining drain and followup with Dr. Harlow Mares and plastic surgery. I will plan to see her back in one month to check her progress

## 2014-02-28 ENCOUNTER — Encounter: Payer: Self-pay | Admitting: Oncology

## 2014-02-28 NOTE — Progress Notes (Signed)
Insurance is paying [phone]-herceptin 100%

## 2014-03-05 ENCOUNTER — Encounter: Payer: Self-pay | Admitting: Oncology

## 2014-03-06 ENCOUNTER — Telehealth: Payer: Self-pay | Admitting: *Deleted

## 2014-03-06 ENCOUNTER — Other Ambulatory Visit: Payer: Self-pay | Admitting: Adult Health

## 2014-03-06 DIAGNOSIS — C50511 Malignant neoplasm of lower-outer quadrant of right female breast: Secondary | ICD-10-CM

## 2014-03-06 MED ORDER — LORAZEPAM 0.5 MG PO TABS: 0.5000 mg | ORAL_TABLET | Freq: Four times a day (QID) | ORAL | Status: DC | PRN

## 2014-03-06 NOTE — Telephone Encounter (Signed)
I have scheduled appt for 6/30 per message from triage and POF. I have called and left the patient a message with appt./  JMW

## 2014-03-07 ENCOUNTER — Ambulatory Visit
Admission: RE | Admit: 2014-03-07 | Payer: BC Managed Care – PPO | Source: Ambulatory Visit | Admitting: Radiation Oncology

## 2014-03-08 ENCOUNTER — Other Ambulatory Visit: Payer: Self-pay | Admitting: Adult Health

## 2014-03-09 ENCOUNTER — Encounter: Payer: Self-pay | Admitting: Family Medicine

## 2014-03-09 ENCOUNTER — Ambulatory Visit (INDEPENDENT_AMBULATORY_CARE_PROVIDER_SITE_OTHER): Payer: BC Managed Care – PPO | Admitting: Family Medicine

## 2014-03-09 ENCOUNTER — Telehealth: Payer: Self-pay | Admitting: Adult Health

## 2014-03-09 VITALS — BP 110/76 | HR 100 | Temp 97.6°F | Resp 16 | Ht 62.0 in | Wt 201.0 lb

## 2014-03-09 DIAGNOSIS — F32A Depression, unspecified: Secondary | ICD-10-CM

## 2014-03-09 DIAGNOSIS — F329 Major depressive disorder, single episode, unspecified: Secondary | ICD-10-CM

## 2014-03-09 DIAGNOSIS — F3289 Other specified depressive episodes: Secondary | ICD-10-CM

## 2014-03-09 MED ORDER — ALPRAZOLAM 1 MG PO TABS: 1.0000 mg | ORAL_TABLET | Freq: Every evening | ORAL | Status: DC | PRN

## 2014-03-09 MED ORDER — ESCITALOPRAM OXALATE 10 MG PO TABS: 10.0000 mg | ORAL_TABLET | Freq: Every day | ORAL | Status: DC

## 2014-03-09 NOTE — Progress Notes (Signed)
Subjective:    Patient ID: Joan Valdez, female    DOB: 1970/11/21, 43 y.o.   MRN: [id number]  HPI Patient is a very pleasant 43 year old white female who is battling stage IIIa breast cancer. Recently over the last few months she has developed depression and anhedonia. She is also developed insomnia. She has poor appetite. She had increasing anxiety. She has no energy. She has poor concentration. She denies any suicidal ideation. She does report frequent mood swings and frequent crying spells. She does have a past medical history of depression. Past Medical History  Diagnosis Date  . PCO (polycystic ovaries)   . Allergic rhinitis     takes Zyrtec daily and nasal spray as needed  . Cervical dysplasia   . Cancer     ;Breast cancer; pre-cancerous cervical cells  . 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 Insomnia     takes Ambien nightly  . Hypothyroid     takes Synthroid daily  . Acid reflux     takes Zantac nightly and Dexilant daily  . Family history of anesthesia complication     "mom has PONV  . Chronic headache     takes topamax nightly;last migraine in Dec 2014  . Kidney stones 07-Jan-2013    "passed on their own"   Current Outpatient Prescriptions on File Prior to Visit  Medication Sig Dispense Refill  . dexlansoprazole (DEXILANT) 60 MG capsule Take 60 mg by mouth daily.      Marland Kitchen dihydroergotamine (MIGRANAL) 4 MG/ML nasal spray Place 1 spray into the nose as needed for migraine. Use in one nostril as directed.  No more than 4 sprays in one hour      . Eflornithine HCl (VANIQA) 13.9 % cream Apply 1 application topically daily.      Marland Kitchen EPIPEN 2-PAK 0.3 MG/0.3ML SOAJ injection       . Goserelin Acetate (ZOLADEX [place]) Inject into the skin every 30 (thirty) days. Next injection due on May 12      . HYDROcodone-acetaminophen (NORCO/VICODIN) 5-325 MG per tablet Take 1-2 tablets by mouth every 4 (four) hours as needed for moderate pain.  30 tablet  0  . levothyroxine  (SYNTHROID, LEVOTHROID) 100 MCG tablet Take 100 mcg by mouth daily.       Marland Kitchen lidocaine-prilocaine (EMLA) cream Apply 1 application topically once as needed (if need for port).      Marland Kitchen liothyronine (CYTOMEL) 25 MCG tablet Take 12.5 mcg by mouth 2 (two) times daily.       Marland Kitchen LORazepam (ATIVAN) 0.5 MG tablet Take 1 tablet (0.5 mg total) by mouth every 6 (six) hours as needed for anxiety.  30 tablet  0  . mometasone (NASONEX) 50 MCG/ACT nasal spray Place 2 sprays into both nostrils daily as needed (allergies).       . Nutritional Supplements (JUICE PLUS FIBRE PO) Take 3 tablets by mouth daily.       . ranitidine (ZANTAC) 300 MG tablet Take 300 mg by mouth at bedtime.      . topiramate (TOPAMAX) 100 MG tablet Take 200 mg by mouth at bedtime.      Marland Kitchen zolpidem (AMBIEN) 10 MG tablet Take 10 mg by mouth at bedtime.       No current facility-administered medications on file prior to visit.   Allergies  Allergen Reactions  . Imitrex [Sumatriptan Base] Anaphylaxis  . Amoxicillin Hives  . Bactrim Hives  . Cephalexin Hives  . Erythromycin  Hives  . Penicillins Hives  . Strawberry Hives  . Sulfa Antibiotics Hives  . Vancomycin Itching  . Zithromax [Azithromycin Dihydrate] Hives   History   Social History  . Marital Status: Married    Spouse Name: N/A    Number of Children: 1  . Years of Education: N/A   Occupational History  . Pharmacist, hospital   .  [place] History Main Topics  . Smoking status: Never Smoker   . Smokeless tobacco: Never Used  . Alcohol Use: Yes     Comment: 02/01/2014 "none in the last 6 months; usually have a couple glasses of wine twice/wk"  . Drug Use: No  . Sexual Activity: Not Currently    Birth Control/ Protection: Other-see comments   Other Topics Concern  . Not on file   Social History Narrative  . No narrative on file      Review of Systems  All other systems reviewed and are negative.      Objective:   Physical Exam  Vitals  reviewed. Cardiovascular: Normal rate, regular rhythm and normal heart sounds.   Pulmonary/Chest: Breath sounds normal. No respiratory distress. She has no wheezes. She has no rales.   patient was sad and tearful on exam        Assessment & Plan:  1. Depression Begin Lexapro 10 mg by mouth each bedtime. The patient can use Xanax 1 mg tablets. She can take half a tablet or a whole tablet at night as needed for insomnia or anxiety. I would like to recheck the patient in 3-4 weeks or sooner/immediately if worse. - escitalopram (LEXAPRO) 10 MG tablet; Take 1 tablet (10 mg total) by mouth daily.  Dispense: 30 tablet; Refill: 5 - ALPRAZolam (XANAX) 1 MG tablet; Take 1 tablet (1 mg total) by mouth at bedtime as needed for anxiety.  Dispense: 30 tablet; Refill: 0

## 2014-03-09 NOTE — Telephone Encounter (Signed)
, °

## 2014-03-13 ENCOUNTER — Other Ambulatory Visit (HOSPITAL_BASED_OUTPATIENT_CLINIC_OR_DEPARTMENT_OTHER): Payer: BC Managed Care – PPO

## 2014-03-13 ENCOUNTER — Ambulatory Visit (HOSPITAL_BASED_OUTPATIENT_CLINIC_OR_DEPARTMENT_OTHER): Payer: BC Managed Care – PPO

## 2014-03-13 VITALS — BP 127/83 | HR 97 | Temp 98.0°F | Resp 19

## 2014-03-13 DIAGNOSIS — C50511 Malignant neoplasm of lower-outer quadrant of right female breast: Secondary | ICD-10-CM

## 2014-03-13 DIAGNOSIS — C50519 Malignant neoplasm of lower-outer quadrant of unspecified female breast: Secondary | ICD-10-CM

## 2014-03-13 DIAGNOSIS — C773 Secondary and unspecified malignant neoplasm of axilla and upper limb lymph nodes: Secondary | ICD-10-CM

## 2014-03-13 DIAGNOSIS — Z5112 Encounter for antineoplastic immunotherapy: Secondary | ICD-10-CM

## 2014-03-13 LAB — CBC WITH DIFFERENTIAL/PLATELET
BASO%: 0.5 % (ref 0.0–2.0)
Basophils Absolute: 0 10*3/uL (ref 0.0–0.1)
EOS%: 2.5 % (ref 0.0–7.0)
Eosinophils Absolute: 0.1 10*3/uL (ref 0.0–0.5)
HCT: 37.8 % (ref 34.8–46.6)
HGB: 12.3 g/dL (ref 11.6–15.9)
LYMPH%: 35 % (ref 14.0–49.7)
MCH: 28.5 pg (ref 25.1–34.0)
MCHC: 32.6 g/dL (ref 31.5–36.0)
MCV: 87.4 fL (ref 79.5–101.0)
MONO#: 0.4 10*3/uL (ref 0.1–0.9)
MONO%: 7 % (ref 0.0–14.0)
NEUT%: 55 % (ref 38.4–76.8)
NEUTROS ABS: 3.2 10*3/uL (ref 1.5–6.5)
Platelets: 173 10*3/uL (ref 145–400)
RBC: 4.32 10*6/uL (ref 3.70–5.45)
RDW: 17.6 % — ABNORMAL HIGH (ref 11.2–14.5)
WBC: 5.8 10*3/uL (ref 3.9–10.3)
lymph#: 2 10*3/uL (ref 0.9–3.3)

## 2014-03-13 LAB — COMPREHENSIVE METABOLIC PANEL (CC13)
ALK PHOS: 80 U/L (ref 40–150)
ALT: 15 U/L (ref 0–55)
AST: 16 U/L (ref 5–34)
Albumin: 3.4 g/dL — ABNORMAL LOW (ref 3.5–5.0)
Anion Gap: 9 mEq/L (ref 3–11)
BILIRUBIN TOTAL: 0.34 mg/dL (ref 0.20–1.20)
BUN: 13.6 mg/dL (ref 7.0–26.0)
CO2: 23 mEq/L (ref 22–29)
Calcium: 9.3 mg/dL (ref 8.4–10.4)
Chloride: 109 mEq/L (ref 98–109)
Creatinine: 0.8 mg/dL (ref 0.6–1.1)
GLUCOSE: 122 mg/dL (ref 70–140)
Potassium: 3.5 mEq/L (ref 3.5–5.1)
SODIUM: 141 meq/L (ref 136–145)
Total Protein: 6.8 g/dL (ref 6.4–8.3)

## 2014-03-13 MED ORDER — TRASTUZUMAB CHEMO INJECTION 440 MG
6.0000 mg/kg | Freq: Once | INTRAVENOUS | Status: AC
  Administered 2014-03-13: 546 mg via INTRAVENOUS
  Filled 2014-03-13: qty 26

## 2014-03-13 MED ORDER — ACETAMINOPHEN 325 MG PO TABS
650.0000 mg | ORAL_TABLET | Freq: Once | ORAL | Status: AC
  Administered 2014-03-13: 650 mg via ORAL

## 2014-03-13 MED ORDER — DIPHENHYDRAMINE HCL 25 MG PO CAPS
50.0000 mg | ORAL_CAPSULE | Freq: Once | ORAL | Status: AC
  Administered 2014-03-13: 50 mg via ORAL

## 2014-03-13 MED ORDER — SODIUM CHLORIDE 0.9 % IV SOLN
Freq: Once | INTRAVENOUS | Status: AC
  Administered 2014-03-13: 16:00:00 via INTRAVENOUS

## 2014-03-13 MED ORDER — HEPARIN SOD (PORK) LOCK FLUSH 100 UNIT/ML IV SOLN
500.0000 [IU] | Freq: Once | INTRAVENOUS | Status: AC | PRN
Start: 2014-03-13 — End: 2014-03-13
  Administered 2014-03-13: 500 [IU]
  Filled 2014-03-13: qty 5

## 2014-03-13 MED ORDER — ACETAMINOPHEN 325 MG PO TABS
ORAL_TABLET | ORAL | Status: AC
  Filled 2014-03-13: qty 2

## 2014-03-13 MED ORDER — SODIUM CHLORIDE 0.9 % IJ SOLN
10.0000 mL | INTRAMUSCULAR | Status: DC | PRN
  Administered 2014-03-13: 10 mL
  Filled 2014-03-13: qty 10

## 2014-03-13 MED ORDER — DIPHENHYDRAMINE HCL 25 MG PO CAPS
ORAL_CAPSULE | ORAL | Status: AC
  Filled 2014-03-13: qty 2

## 2014-03-13 NOTE — Patient Instructions (Signed)
[facility] Discharge Instructions for Patients Receiving Chemotherapy  Today you received the following chemotherapy agents: Herceptin  To help prevent nausea and vomiting after your treatment, we encourage you to take your nausea medication as prescribed by your physician.    If you develop nausea and vomiting that is not controlled by your nausea medication, call the clinic.   BELOW ARE SYMPTOMS THAT SHOULD BE REPORTED IMMEDIATELY:  *FEVER GREATER THAN 100.5 F  *CHILLS WITH OR WITHOUT FEVER  NAUSEA AND VOMITING THAT IS NOT CONTROLLED WITH YOUR NAUSEA MEDICATION  *UNUSUAL SHORTNESS OF BREATH  *UNUSUAL BRUISING OR BLEEDING  TENDERNESS IN MOUTH AND THROAT WITH OR WITHOUT PRESENCE OF ULCERS  *URINARY PROBLEMS  *BOWEL PROBLEMS  UNUSUAL RASH Items with * indicate a potential emergency and should be followed up as soon as possible.  Feel free to call the clinic you have any questions or concerns. The clinic phone number is [phone].    

## 2014-03-14 ENCOUNTER — Telehealth: Payer: Self-pay

## 2014-03-14 NOTE — Telephone Encounter (Signed)
LMOVM - disability form is completed and signed - ready to pick up.  Form will be at lobby front desk.  Pt to call clinic if she has any questions.

## 2014-03-15 ENCOUNTER — Ambulatory Visit: Payer: BC Managed Care – PPO | Admitting: Radiation Oncology

## 2014-03-15 ENCOUNTER — Ambulatory Visit: Payer: BC Managed Care – PPO

## 2014-03-19 ENCOUNTER — Ambulatory Visit
Admission: RE | Admit: 2014-03-19 | Discharge: 2014-03-19 | Disposition: A | Discharge status: Home / Self Care | Payer: BC Managed Care – PPO | Source: Ambulatory Visit | Attending: Radiation Oncology | Admitting: Radiation Oncology

## 2014-03-19 ENCOUNTER — Ambulatory Visit: Payer: BC Managed Care – PPO

## 2014-03-19 DIAGNOSIS — C50511 Malignant neoplasm of lower-outer quadrant of right female breast: Secondary | ICD-10-CM

## 2014-03-19 DIAGNOSIS — Z510 Encounter for antineoplastic radiation therapy: Secondary | ICD-10-CM | POA: Diagnosis not present

## 2014-03-19 NOTE — Progress Notes (Signed)
  Radiation Oncology         [phone] ________________________________  Name: Joan Valdez MRN: [id number]  Date: 03/19/2014  DOB: 10/06/1970   SIMULATION AND TREATMENT PLANNING NOTE  The patient presented for simulation prior to beginning her course of radiation treatment for her diagnosis of right-sided breast cancer. The patient was placed in a supine position on a breast board. A customized alpha-cradle also constructed and this complex treatment device will be used on a daily basis during her treatment. In this fashion, a CT scan was obtained through the chest area and an isocenter was placed near the chest wall at the upper aspect of the right chest.  The patient will be planned to receive a course of radiation initially to a dose of 50.4 gray. This will consist of a 4 field technique targeting the chest wall as well as the supraclavicular region. Therefore 2 customized medial and lateral tangent fields have been created targeting the chest wall, and also 2 additional customized fields have been designed to treat the supraclavicular region both with a right supraclavicular field and a right posterior axillary boost field. This treatment will be accomplished at 1.8 gray per fraction. A complex isodose plan is requested to ensure that the target area is adequately covered dosimetrically. A forward planning technique will also be evaluated to determine if this approach improves the plan. It is anticipated that the patient will then receive a 10 gray boost to the scar plus margin. This will be accomplished at 2 gray per fraction. The final anticipated total dose therefore will correspond to 60.4 gray.    _______________________________   Jodelle Gross, MD, PhD

## 2014-03-20 ENCOUNTER — Ambulatory Visit: Payer: BC Managed Care – PPO

## 2014-03-20 ENCOUNTER — Ambulatory Visit (HOSPITAL_BASED_OUTPATIENT_CLINIC_OR_DEPARTMENT_OTHER): Payer: BC Managed Care – PPO

## 2014-03-20 ENCOUNTER — Ambulatory Visit (HOSPITAL_BASED_OUTPATIENT_CLINIC_OR_DEPARTMENT_OTHER): Payer: BC Managed Care – PPO | Admitting: Oncology

## 2014-03-20 ENCOUNTER — Telehealth: Payer: Self-pay | Admitting: Oncology

## 2014-03-20 ENCOUNTER — Telehealth: Payer: Self-pay | Admitting: *Deleted

## 2014-03-20 VITALS — BP 133/85 | HR 79 | Temp 98.1°F | Resp 18 | Ht 62.0 in | Wt 200.2 lb

## 2014-03-20 DIAGNOSIS — C50519 Malignant neoplasm of lower-outer quadrant of unspecified female breast: Secondary | ICD-10-CM

## 2014-03-20 DIAGNOSIS — Z5111 Encounter for antineoplastic chemotherapy: Secondary | ICD-10-CM

## 2014-03-20 DIAGNOSIS — Z170 Estrogen receptor positive status [ER+]: Secondary | ICD-10-CM

## 2014-03-20 DIAGNOSIS — C773 Secondary and unspecified malignant neoplasm of axilla and upper limb lymph nodes: Secondary | ICD-10-CM

## 2014-03-20 DIAGNOSIS — E039 Hypothyroidism, unspecified: Secondary | ICD-10-CM

## 2014-03-20 DIAGNOSIS — C50511 Malignant neoplasm of lower-outer quadrant of right female breast: Secondary | ICD-10-CM

## 2014-03-20 MED ORDER — GOSERELIN ACETATE 3.6 MG ~~LOC~~ IMPL
3.6000 mg | DRUG_IMPLANT | Freq: Once | SUBCUTANEOUS | Status: AC
  Administered 2014-03-20: 3.6 mg via SUBCUTANEOUS
  Filled 2014-03-20: qty 3.6

## 2014-03-20 NOTE — Telephone Encounter (Signed)
Per staff message and POF I have scheduled appts. Advised scheduler of appts. JMW  

## 2014-03-20 NOTE — Telephone Encounter (Signed)
, °

## 2014-03-20 NOTE — Progress Notes (Signed)
[facility]  [phone] [phone]     ID: Joan Valdez DOB: 07/25/1971  MR#: [id number]  CSN#:[id number]  Patient Care Team: Warren T [name], MD as PCP - General (Family Medicine)  CHIEF COMPLAINT:   CURRENT TREATMENT:    BREAST CANCER HISTORY: From Dr. Kalsoom Khan's intake note 09/01/2013:  "Joan Valdez is a 42 y.o. female. Would medical history significant for hypothyroidism gastroesophageal reflux disease and migraine headaches.patient had a routine screening mammogram performed she was noted to have a large mass in the 6:00 position of the right breast measuring 3 cm for the primary mass with another 2 cm of calcifications anteriorly. She was also noted to have a enlarged lymph node in the right axilla. Patient underwent a needle core biopsy of the primary mass as well as axilla. The tumor was ER positive PR positive HER-2 positive with a proliferation marker Ki-67 59%. Patient had MRI of the breasts performed MRI showed right mass to measure 8.2 x 5.6 x 3.1 cm lymph node measured 2.5 cm on the left breast there was a 9 millimeter area of concern. Patient has been seen by Dr. Paul Toth who has recommended neoadjuvant treatment."  The patient's subsequent history is as detailed below.   INTERVAL HISTORY: Joan Valdez returns today for followup of her breast cancer. Since her last visit here she underwent right modified radical mastectomy 02/01/2014 which showed only residual ductal carcinoma in situ, with negative margins. All 11 lymph nodes sampled were clear. She also had a left simple mastectomy which showed benign tissue. ([id number]) The patient has been evaluated by radiation oncology and is scheduled to start her radiation treatments next week. She is here today to establish herself in my service.  REVIEW OF SYSTEMS: Joan Valdez is doing "pretty good" and tolerated the surgery well. There are surprise however, after completing the surgery she  started crying, became very sad, and was generally quite depressed. She saw Dr. [name] and he started her on Lexapro for days ago. She has not noted any change yet been some sense she is already feeling better because she does expect the drug to work. Aside from anxiety and depression a detailed review of systems today was entirely benign.   PAST MEDICAL HISTORY: Past Medical History  Diagnosis Date  . PCO (polycystic ovaries)   . Allergic rhinitis     takes Zyrtec daily and nasal spray as needed  . Cervical dysplasia   . Cancer     ;Breast cancer; pre-cancerous cervical cells  . 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 Insomnia     takes Ambien nightly  . Hypothyroid     takes Synthroid daily  . Acid reflux     takes Zantac nightly and Dexilant daily  . Family history of anesthesia complication     "mom has PONV  . Chronic headache     takes topamax nightly;last migraine in Dec 2014  . Kidney stones 2014    "passed on their own"    PAST SURGICAL HISTORY: Past Surgical History  Procedure Laterality Date  . Cesarean section  2001  . Colposcopy    . Cervical biopsy  w/ loop electrode excision  2002  . Portacath placement N/A 09/04/2013    Procedure: INSERTION PORT-A-CATH;  Surgeon: Paul S Toth III, MD;  Location: [facility] OR;  Service: General;  Laterality: N/A;  . Latissimus flap to breast Left 02/01/2014  . Mastectomy complete / simple Left 02/01/2014  .   Mastectomy modified radical w/ axillary lymph nodes w/ or w/o pectoralis minor Right 02/01/2014  . Eye surgery Right 1970's?    Hx: of metal fragment removed  . Breast biopsy Bilateral 08/2013-09/2013  . Portacath placement Left 08/2013  . Total mastectomy Left 02/01/2014    Procedure: LEFT  SIMPLE MASTECTOMY;  Surgeon: Merrie Roof, MD;  Location: [facility];  Service: General;  Laterality: Left;  Marland Kitchen Mastectomy w/ sentinel node biopsy Right 02/01/2014    Procedure: RIGHT MODIFIED MASTECTOMY WITH AXILLARY NODE DISSECTION;   Surgeon: Merrie Roof, MD;  Location: [facility];  Service: General;  Laterality: Right;  . Latissimus flap to breast Left 02/01/2014    Procedure: LEFT LATISSIMUS FLAP TO BREAST WITH Silicone gel implant for immediate reconstruction.;  Surgeon: Crissie Reese, MD;  Location: [facility];  Service: Plastics;  Laterality: Left;    FAMILY HISTORY Family History  Problem Relation Age of Onset  . Hypertension Mother   . Migraines Mother   . Diabetes Paternal Grandmother   . Hypertension Paternal Grandmother   . Rheum arthritis Paternal Grandmother   . Heart disease Maternal Grandmother   . Aortic aneurysm Paternal Grandfather   . Psychiatric Illness Cousin   . Epilepsy Other 76    niece   Tracey's parents are still alive, her father is 6 years old and her mother also a 41. She has one brother no sisters. There is no history of breast or ovarian cancer in the family to her knowledge  GYNECOLOGIC HISTORY:  No LMP recorded. Patient is not currently having periods (Reason: Other). Menarche age 68, first live birth age 60, she is [place] P1. She has been on monthly goserelin since December 2014. She tells me her periods are "horrible" and she is willing to go on with goserelin indefinitely or have her ovaries removed just so as not to have to face that problem again, she tells me.  SOCIAL HISTORY:  She works as a third Land. Her husband Nicki Reaper works for a [employer] in Social research officer, government. Their daughter's name is Lilia Pro. She is currently 60. The patient at tends [place].    ADVANCED DIRECTIVES: In place   HEALTH MAINTENANCE: History  Substance Use Topics  . Smoking status: Never Smoker   . Smokeless tobacco: Never Used  . Alcohol Use: Yes     Comment: 02/01/2014 "none in the last 6 months; usually have a couple glasses of wine twice/wk"     Colonoscopy:  PAP:  Bone density:  Lipid panel:  Allergies  Allergen Reactions  . Imitrex [Sumatriptan Base] Anaphylaxis  .  Amoxicillin Hives  . Bactrim Hives  . Cephalexin Hives  . Erythromycin Hives  . Penicillins Hives  . Strawberry Hives  . Sulfa Antibiotics Hives  . Vancomycin Itching  . Zithromax [Azithromycin Dihydrate] Hives    Current Outpatient Prescriptions  Medication Sig Dispense Refill  . ALPRAZolam (XANAX) 1 MG tablet Take 1 tablet (1 mg total) by mouth at bedtime as needed for anxiety.  30 tablet  0  . dexlansoprazole (DEXILANT) 60 MG capsule Take 60 mg by mouth daily.      Marland Kitchen dihydroergotamine (MIGRANAL) 4 MG/ML nasal spray Place 1 spray into the nose as needed for migraine. Use in one nostril as directed.  No more than 4 sprays in one hour      . Eflornithine HCl (VANIQA) 13.9 % cream Apply 1 application topically daily.      Marland Kitchen EPIPEN 2-PAK  0.3 MG/0.3ML SOAJ injection       . escitalopram (LEXAPRO) 10 MG tablet Take 1 tablet (10 mg total) by mouth daily.  30 tablet  5  . Goserelin Acetate (ZOLADEX ) Inject into the skin every 30 (thirty) days. Next injection due on May 12      . HYDROcodone-acetaminophen (NORCO/VICODIN) 5-325 MG per tablet Take 1-2 tablets by mouth every 4 (four) hours as needed for moderate pain.  30 tablet  0  . levothyroxine (SYNTHROID, LEVOTHROID) 100 MCG tablet Take 100 mcg by mouth daily.       Marland Kitchen lidocaine-prilocaine (EMLA) cream Apply 1 application topically once as needed (if need for port).      Marland Kitchen liothyronine (CYTOMEL) 25 MCG tablet Take 12.5 mcg by mouth 2 (two) times daily.       Marland Kitchen LORazepam (ATIVAN) 0.5 MG tablet Take 1 tablet (0.5 mg total) by mouth every 6 (six) hours as needed for anxiety.  30 tablet  0  . mometasone (NASONEX) 50 MCG/ACT nasal spray Place 2 sprays into both nostrils daily as needed (allergies).       . Nutritional Supplements (JUICE PLUS FIBRE PO) Take 3 tablets by mouth daily.       . ranitidine (ZANTAC) 300 MG tablet Take 300 mg by mouth at bedtime.      . topiramate (TOPAMAX) 100 MG tablet Take 200 mg by mouth at bedtime.      Marland Kitchen zolpidem  (AMBIEN) 10 MG tablet Take 10 mg by mouth at bedtime.       No current facility-administered medications for this visit.    OBJECTIVE: Young white woman in no acute distress Filed Vitals:   03/20/14 1459  BP: 133/85  Pulse: 79  Temp: 98.1 F (36.7 C)  Resp: 18     Body mass index is 36.61 kg/(m^2).    ECOG FS:0 - Asymptomatic  Ocular: Sclerae unicteric, hair beginning to grow back Ear-nose-throat: Oropharynx clear Lymphatic: No cervical or supraclavicular adenopathy Lungs no rales or rhonchi, good excursion bilaterally Heart regular rate and rhythm, no murmur appreciated Abd soft, nontender, positive bowel sounds MSK no focal spinal tenderness, no joint edema Neuro: non-focal, well-oriented, appropriate affect Breasts: Status post bilateral mastectomies with left latissimus flap reconstruction. The incisions are healing nicely, with no erythema, swelling, or dehiscence. Both axillae are benign.   LAB RESULTS:  CMP     Component Value Date/Time   NA 141 03/13/2014 1530   NA 144 01/23/2014 0914   K 3.5 03/13/2014 1530   K 3.7 01/23/2014 0914   CL 106 01/23/2014 0914   CO2 23 03/13/2014 1530   CO2 24 01/23/2014 0914   GLUCOSE 122 03/13/2014 1530   GLUCOSE 99 01/23/2014 0914   BUN 13.6 03/13/2014 1530   BUN 12 01/23/2014 0914   CREATININE 0.8 03/13/2014 1530   CREATININE 0.66 01/23/2014 0914   CALCIUM 9.3 03/13/2014 1530   CALCIUM 8.9 01/23/2014 0914   PROT 6.8 03/13/2014 1530   ALBUMIN 3.4* 03/13/2014 1530   AST 16 03/13/2014 1530   ALT 15 03/13/2014 1530   ALKPHOS 80 03/13/2014 1530   BILITOT 0.34 03/13/2014 1530   GFRNONAA >90 01/23/2014 0914   GFRAA >90 01/23/2014 0914    I No results found for this basename: SPEP,  UPEP,   kappa and lambda light chains    Lab Results  Component Value Date   WBC 5.8 03/13/2014   NEUTROABS 3.2 03/13/2014   HGB 12.3 03/13/2014   HCT  37.8 03/13/2014   MCV 87.4 03/13/2014   PLT 173 03/13/2014      Chemistry      Component Value Date/Time   NA  141 03/13/2014 1530   NA 144 01/23/2014 0914   K 3.5 03/13/2014 1530   K 3.7 01/23/2014 0914   CL 106 01/23/2014 0914   CO2 23 03/13/2014 1530   CO2 24 01/23/2014 0914   BUN 13.6 03/13/2014 1530   BUN 12 01/23/2014 0914   CREATININE 0.8 03/13/2014 1530   CREATININE 0.66 01/23/2014 0914      Component Value Date/Time   CALCIUM 9.3 03/13/2014 1530   CALCIUM 8.9 01/23/2014 0914   ALKPHOS 80 03/13/2014 1530   AST 16 03/13/2014 1530   ALT 15 03/13/2014 1530   BILITOT 0.34 03/13/2014 1530       No results found for this basename: LABCA2    No components found with this basename: LABCA125    No results found for this basename: INR,  in the last 168 hours  Urinalysis    Component Value Date/Time   COLORURINE YELLOW 09/12/2013 1506   APPEARANCEUR CLEAR 09/12/2013 1506   LABSPEC 1.015 09/12/2013 1506   PHURINE 7.5 09/12/2013 1506   GLUCOSEU NEG 09/12/2013 1506   HGBUR NEG 09/12/2013 1506   BILIRUBINUR NEG 09/12/2013 1506   KETONESUR NEG 09/12/2013 1506   PROTEINUR NEG 09/12/2013 1506   UROBILINOGEN 0.2 09/12/2013 1506   NITRITE NEG 09/12/2013 1506   LEUKOCYTESUR NEG 09/12/2013 1506    STUDIES: No results found.  ASSESSMENT: 42 y.o. BRCA negative [place] woman  (1) status post right lower outer quadrant and right axillary lymph node biopsy 08/16/2013 ([id number]), both positive for a clinical T3 N1, stage IIIA invasive ductal carcinoma, grade 2, estrogen receptor 100% positive, progesterone receptor 100% positive, with an MIB-1 of 72% and HER-2 positive with a signals ratio of 5.77 and the number per cell 7.50  (2) biopsy of a questionable area in the left breast 09/12/2013 showed only fibrocystic changes ([id number])  (3) neoadjuvant chemotherapy consisted with carboplatin, docetaxel, pertuzumab and trastuzumab given every 21 days for 6 cycles between 09/19/2013 and 01/09/2014  (4) trastuzumab alone being continued to the total one year. Most recent echocardiogram 01/22/2014  showed a well preserved ejection fraction  (5) bilateral mastectomies with right axillary lymph node dissection 02/01/2014 showed ([id number])  (a) on the left, benign breast tissue   (b) on the right, pTis pN0 (no invasive malignancy, all 11 nodes sampled clear)  (6) adjuvant radiation to start 03/23/2014  (7) adjuvant anti-estrogens to follow radiation  (8) genetic testing March 2015 showed no mutations in ATM, BARD1, BRCA1, BRCA2, BRIP1, CDH1, CHEK2, EPCAM, FANCC, MLH1, MSH2, MSH6, NBN, PALB2, PMS2, PTEN, RAD51C, RAD51D, STK11, TP53, and XRCC2.   (9) on monthly goserelin as of 09/25/2013  (10) status post left latissimus flap reconstruction, with the same plan for the right side no sooner than 6 months after completion of radiation   PLAN: I spent approximately 45 minutes reviewing March situation in detail. She understands her response to chemotherapy was excellent and there was no residual invasive disease either in her breast or lymph nodes at the time of mastectomy. There was some ductal carcinoma in situ in the right breast, but ductal carcinoma in situ is cured with mastectomy. This is a type of complete pathologic response and predicts a good long-term outcome for her.  We discussed the difference between local and systemic treatment for breast cancer. She   has had excellent surgery and now will have radiation to the right side to complete her local treatment. This does not effect the risk of distant recurrence, or only minimally. For systemic therapy she has already had chemotherapy, which lowers the risk by one third. She is receiving anti-HER-2 treatment which lowers the risk by a half, and she will receive antiestrogen treatment which further lowers the risk by half. Accordingly her risk of recurrence will be 16 of the risk she would have if she received local treatment only.  Mescal tells me her periods were "horrible" and although she is having menopausal symptoms including mood  changes which are distressing in themselves, she would much prefer them to going back to menstruation. Of course chemotherapy could have put her into menopause, but given her young age the likelihood is that she would resume menstruating a few months after chemotherapy. Accordingly I am not uncomfortable continuing the monthly goserelin for an additional year and a half at least.  She is going to return to see me in 2 months. If she has recovered sufficiently from her surgery and radiation we will consider starting anastrozole at that time.  The patient has a good understanding of the overall plan. She agrees with it. She knows the goal of treatment in her case is cure. She will call with any problems that may develop before her next visit here.  Chauncey Cruel, MD   03/20/2014 8:22 PM

## 2014-03-21 ENCOUNTER — Ambulatory Visit: Payer: BC Managed Care – PPO

## 2014-03-21 DIAGNOSIS — Z510 Encounter for antineoplastic radiation therapy: Secondary | ICD-10-CM | POA: Diagnosis not present

## 2014-03-21 NOTE — Addendum Note (Signed)
Addended by: Laureen Abrahams on: 03/21/2014 04:04 PM   Modules accepted: Medications

## 2014-03-22 ENCOUNTER — Ambulatory Visit: Payer: BC Managed Care – PPO | Admitting: Oncology

## 2014-03-22 ENCOUNTER — Ambulatory Visit
Admission: RE | Admit: 2014-03-22 | Discharge: 2014-03-22 | Disposition: A | Discharge status: Home / Self Care | Payer: BC Managed Care – PPO | Source: Ambulatory Visit | Attending: Radiation Oncology | Admitting: Radiation Oncology

## 2014-03-22 ENCOUNTER — Ambulatory Visit: Payer: BC Managed Care – PPO

## 2014-03-22 DIAGNOSIS — Z510 Encounter for antineoplastic radiation therapy: Secondary | ICD-10-CM | POA: Diagnosis not present

## 2014-03-22 DIAGNOSIS — C50511 Malignant neoplasm of lower-outer quadrant of right female breast: Secondary | ICD-10-CM

## 2014-03-22 NOTE — Progress Notes (Signed)
  Radiation Oncology         [phone] ________________________________  Name: Joan Valdez MRN: [id number]  Date: 03/22/2014  DOB: 04-Jul-1971  Simulation Verification Note   NARRATIVE: The patient was brought to the treatment unit and placed in the planned treatment position. The clinical setup was verified. Then port films were obtained and uploaded to the radiation oncology medical record software.  The treatment beams were carefully compared against the planned radiation fields. The position, location, and shape of the radiation fields was reviewed. The targeted volume of tissue appears to be appropriately covered by the radiation beams. Based on my personal review, I approved the simulation verification. The patient's treatment will proceed as planned.  ________________________________   Jodelle Gross, MD, PhD

## 2014-03-23 ENCOUNTER — Ambulatory Visit
Admission: RE | Admit: 2014-03-23 | Discharge: 2014-03-23 | Disposition: A | Discharge status: Home / Self Care | Payer: BC Managed Care – PPO | Source: Ambulatory Visit | Attending: Radiation Oncology | Admitting: Radiation Oncology

## 2014-03-23 ENCOUNTER — Ambulatory Visit: Payer: BC Managed Care – PPO

## 2014-03-23 VITALS — BP 120/73 | HR 78 | Temp 98.1°F | Resp 14 | Wt 201.8 lb

## 2014-03-23 DIAGNOSIS — C50511 Malignant neoplasm of lower-outer quadrant of right female breast: Secondary | ICD-10-CM

## 2014-03-23 DIAGNOSIS — Z510 Encounter for antineoplastic radiation therapy: Secondary | ICD-10-CM | POA: Diagnosis not present

## 2014-03-23 MED ORDER — ALRA NON-METALLIC DEODORANT (RAD-ONC)
1.0000 "application " | Freq: Once | TOPICAL | Status: AC
  Administered 2014-03-23: 1 via TOPICAL

## 2014-03-23 MED ORDER — RADIAPLEXRX EX GEL
Freq: Once | CUTANEOUS | Status: AC
  Administered 2014-03-23: 12:00:00 via TOPICAL

## 2014-03-23 NOTE — Progress Notes (Signed)
She is currently in no pain. Pt is in good spirits with no complaints.  Pt right breast with a moderate amount of dried yellow drainage at surgical site.

## 2014-03-23 NOTE — Progress Notes (Signed)
Patient education breast, rad book, radiaplex gel, skin irritation,fatigue, side effects discussed, tenderness, soreness in breast,increas protein  In diet, drink plenty water,sees MD and staff RN after rad txs weekly and prn, all question  Answered,teach back given, 2 txs completed to breast

## 2014-03-23 NOTE — Progress Notes (Signed)
Department of Radiation Oncology  Phone:  [phone] Fax:        [phone]  Weekly Treatment Note    Name: Joan Valdez Date: 2/44/0102 MRN: [id number] DOB: 1971-03-20   Current dose: 3.6 Gy  Current fraction:2   MEDICATIONS: Current Outpatient Prescriptions  Medication Sig Dispense Refill  . ALPRAZolam (XANAX) 1 MG tablet Take 1 tablet (1 mg total) by mouth at bedtime as needed for anxiety.  30 tablet  0  . dexlansoprazole (DEXILANT) 60 MG capsule Take 60 mg by mouth daily.      Marland Kitchen dihydroergotamine (MIGRANAL) 4 MG/ML nasal spray Place 1 spray into the nose as needed for migraine. Use in one nostril as directed.  No more than 4 sprays in one hour      . Eflornithine HCl (VANIQA) 13.9 % cream Apply 1 application topically daily.      Marland Kitchen EPIPEN 2-PAK 0.3 MG/0.3ML SOAJ injection       . escitalopram (LEXAPRO) 10 MG tablet Take 1 tablet (10 mg total) by mouth daily.  30 tablet  5  . Goserelin Acetate (ZOLADEX [place]) Inject into the skin every 30 (thirty) days. Next injection due on May 12      . hyaluronate sodium (RADIAPLEXRX) GEL Apply 1 application topically 2 (two) times daily. Apply to skin area after rad txs daily and bedtime and on wekends      . HYDROcodone-acetaminophen (NORCO/VICODIN) 5-325 MG per tablet Take 1-2 tablets by mouth every 4 (four) hours as needed for moderate pain.  30 tablet  0  . levothyroxine (SYNTHROID, LEVOTHROID) 100 MCG tablet Take 100 mcg by mouth daily.       Marland Kitchen lidocaine-prilocaine (EMLA) cream Apply 1 application topically once as needed (if need for port).      Marland Kitchen liothyronine (CYTOMEL) 25 MCG tablet Take 12.5 mcg by mouth 2 (two) times daily.       Marland Kitchen LORazepam (ATIVAN) 0.5 MG tablet Take 1 tablet (0.5 mg total) by mouth every 6 (six) hours as needed for anxiety.  30 tablet  0  . methocarbamol (ROBAXIN) 500 MG tablet       . mometasone (NASONEX) 50 MCG/ACT nasal spray Place 2 sprays into both nostrils daily as needed (allergies).       Derrill Memo ON 3/47/4259] non-metallic deodorant (ALRA) MISC Apply 1 application topically daily. Apply after rad txs daily and prn      . Nutritional Supplements (JUICE PLUS FIBRE PO) Take 3 tablets by mouth daily.       . ranitidine (ZANTAC) 300 MG tablet Take 300 mg by mouth at bedtime.      . topiramate (TOPAMAX) 100 MG tablet Take 200 mg by mouth at bedtime.      Marland Kitchen zolpidem (AMBIEN) 10 MG tablet Take 10 mg by mouth at bedtime.       No current facility-administered medications for this encounter.     ALLERGIES: Imitrex; Amoxicillin; Bactrim; Cephalexin; Erythromycin; Penicillins; Strawberry; Sulfa antibiotics; Vancomycin; and Zithromax   LABORATORY DATA:  Lab Results  Component Value Date   WBC 5.8 03/13/2014   HGB 12.3 03/13/2014   HCT 37.8 03/13/2014   MCV 87.4 03/13/2014   PLT 173 03/13/2014   Lab Results  Component Value Date   NA 141 03/13/2014   K 3.5 03/13/2014   CL 106 01/23/2014   CO2 23 03/13/2014   Lab Results  Component Value Date   ALT 15 03/13/2014   AST 16 03/13/2014   ALKPHOS  80 03/13/2014   BILITOT 0.34 03/13/2014     NARRATIVE: Joan Valdez was seen today for weekly treatment management. The chart was checked and the patient's films were reviewed. The patient is doing very well and her first week of treatment. No complaints or difficulties so far.  PHYSICAL EXAMINATION: weight is 201 lb 12.8 oz (91.536 kg). Her temperature is 98.1 F (36.7 C). Her blood pressure is 120/73 and her pulse is 78. Her respiration is 14 and oxygen saturation is 98%.        ASSESSMENT: The patient is doing satisfactorily with treatment.  PLAN: We will continue with the patient's radiation treatment as planned.

## 2014-03-26 ENCOUNTER — Encounter (INDEPENDENT_AMBULATORY_CARE_PROVIDER_SITE_OTHER): Payer: Self-pay | Admitting: General Surgery

## 2014-03-26 ENCOUNTER — Ambulatory Visit (INDEPENDENT_AMBULATORY_CARE_PROVIDER_SITE_OTHER): Payer: BC Managed Care – PPO | Admitting: General Surgery

## 2014-03-26 ENCOUNTER — Ambulatory Visit
Admission: RE | Admit: 2014-03-26 | Discharge: 2014-03-26 | Disposition: A | Discharge status: Home / Self Care | Payer: BC Managed Care – PPO | Source: Ambulatory Visit | Attending: Radiation Oncology | Admitting: Radiation Oncology

## 2014-03-26 ENCOUNTER — Encounter (INDEPENDENT_AMBULATORY_CARE_PROVIDER_SITE_OTHER): Payer: BC Managed Care – PPO | Admitting: General Surgery

## 2014-03-26 ENCOUNTER — Ambulatory Visit: Payer: BC Managed Care – PPO

## 2014-03-26 VITALS — BP 116/74 | HR 78 | Temp 97.6°F | Resp 16 | Ht 62.0 in | Wt 200.2 lb

## 2014-03-26 DIAGNOSIS — C50511 Malignant neoplasm of lower-outer quadrant of right female breast: Secondary | ICD-10-CM

## 2014-03-26 DIAGNOSIS — Z510 Encounter for antineoplastic radiation therapy: Secondary | ICD-10-CM | POA: Diagnosis not present

## 2014-03-26 DIAGNOSIS — C50519 Malignant neoplasm of lower-outer quadrant of unspecified female breast: Secondary | ICD-10-CM

## 2014-03-26 NOTE — Progress Notes (Signed)
Subjective:     Patient ID: Joan Valdez, female   DOB: 1971/08/30, 43 y.o.   MRN: [id number]  HPI The patient is a 43 year old white female who is about 6 weeks status post right modified radical mastectomy and left simple mastectomy for a locally advanced right breast cancer. She received neoadjuvant therapy. On her final pathology only DCIS was seen. Her pain has pretty much resolved now. She has no complaints today.  Review of Systems     Objective:   Physical Exam On exam her right mastectomy incision and her left reconstruction are healing very well. There is no sign of infection or significant seroma. There is no palpable mass of the right chest wall.    Assessment:     The patient is 6 weeks status post right modified radical mastectomy and left simple mastectomy for a locally advanced right breast cancer     Plan:     At this point she will continue with her radiation therapy. We'll plan to see her back in about 3 months to check her progress.

## 2014-03-27 ENCOUNTER — Ambulatory Visit
Admission: RE | Admit: 2014-03-27 | Discharge: 2014-03-27 | Disposition: A | Discharge status: Home / Self Care | Payer: BC Managed Care – PPO | Source: Ambulatory Visit | Attending: Radiation Oncology | Admitting: Radiation Oncology

## 2014-03-27 ENCOUNTER — Ambulatory Visit: Payer: BC Managed Care – PPO

## 2014-03-27 DIAGNOSIS — Z510 Encounter for antineoplastic radiation therapy: Secondary | ICD-10-CM | POA: Diagnosis not present

## 2014-03-28 ENCOUNTER — Ambulatory Visit: Payer: BC Managed Care – PPO

## 2014-03-28 ENCOUNTER — Ambulatory Visit
Admission: RE | Admit: 2014-03-28 | Discharge: 2014-03-28 | Disposition: A | Discharge status: Home / Self Care | Payer: BC Managed Care – PPO | Source: Ambulatory Visit | Attending: Radiation Oncology | Admitting: Radiation Oncology

## 2014-03-28 DIAGNOSIS — Z510 Encounter for antineoplastic radiation therapy: Secondary | ICD-10-CM | POA: Diagnosis not present

## 2014-03-29 ENCOUNTER — Ambulatory Visit: Payer: BC Managed Care – PPO

## 2014-03-29 ENCOUNTER — Ambulatory Visit
Admission: RE | Admit: 2014-03-29 | Discharge: 2014-03-29 | Disposition: A | Discharge status: Home / Self Care | Payer: BC Managed Care – PPO | Source: Ambulatory Visit | Attending: Radiation Oncology | Admitting: Radiation Oncology

## 2014-03-29 DIAGNOSIS — Z510 Encounter for antineoplastic radiation therapy: Secondary | ICD-10-CM | POA: Diagnosis not present

## 2014-03-30 ENCOUNTER — Encounter: Payer: Self-pay | Admitting: Radiation Oncology

## 2014-03-30 ENCOUNTER — Ambulatory Visit
Admission: RE | Admit: 2014-03-30 | Discharge: 2014-03-30 | Disposition: A | Discharge status: Home / Self Care | Payer: BC Managed Care – PPO | Source: Ambulatory Visit | Attending: Radiation Oncology | Admitting: Radiation Oncology

## 2014-03-30 ENCOUNTER — Ambulatory Visit: Payer: BC Managed Care – PPO

## 2014-03-30 VITALS — BP 121/80 | HR 98 | Temp 97.5°F | Ht 62.0 in | Wt 200.7 lb

## 2014-03-30 DIAGNOSIS — Z510 Encounter for antineoplastic radiation therapy: Secondary | ICD-10-CM | POA: Diagnosis not present

## 2014-03-30 DIAGNOSIS — C50511 Malignant neoplasm of lower-outer quadrant of right female breast: Secondary | ICD-10-CM

## 2014-03-30 NOTE — Progress Notes (Signed)
Joan Valdez has had 7 fractions to her right subclavian and right chest wall.  She denies pain.  She is reporting fatigue.  The skin on her right chest wall and right upper chest area is pink and intact.  She is using radiaplex.  She has a dressing on her left breast from an infected stitch from reconstruction surgery.

## 2014-03-30 NOTE — Progress Notes (Signed)
  Radiation Oncology         [phone] ________________________________  Name: Joan Valdez MRN: [id number]  Date: 03/30/2014  DOB: 23-Aug-1971  Weekly Radiation Therapy Management  Current Dose: 12.6 Gy     Planned Dose:  50.4 Gy  Narrative . . . . . . . . The patient presents for routine under treatment assessment.                                   The patient is without complaint.  Joan Valdez has had 7 fractions to her right supraclav and right chest wall. She denies pain. She is reporting fatigue. The skin on her right chest wall and right upper chest area is pink and intact. She is using radiaplex. She has a dressing on her left breast from an infected stitch from reconstruction surgery                                 Set-up films were reviewed.                                 The chart was checked. Physical Findings. . .  height is 5\' 2"  (1.575 m) and weight is 200 lb 11.2 oz (91.037 kg). Her oral temperature is 97.5 F (36.4 C). Her blood pressure is 121/80 and her pulse is 98. . Weight essentially stable.  No significant changes. Impression . . . . . . . The patient is tolerating radiation. Plan . . . . . . . . . . . . Continue treatment as planned.  ________________________________  Sheral Apley. Tammi Klippel, M.D.

## 2014-04-02 ENCOUNTER — Ambulatory Visit
Admission: RE | Admit: 2014-04-02 | Discharge: 2014-04-02 | Disposition: A | Discharge status: Home / Self Care | Payer: BC Managed Care – PPO | Source: Ambulatory Visit | Attending: Radiation Oncology | Admitting: Radiation Oncology

## 2014-04-02 ENCOUNTER — Ambulatory Visit: Payer: BC Managed Care – PPO

## 2014-04-02 DIAGNOSIS — Z510 Encounter for antineoplastic radiation therapy: Secondary | ICD-10-CM | POA: Diagnosis not present

## 2014-04-03 ENCOUNTER — Ambulatory Visit: Payer: BC Managed Care – PPO

## 2014-04-03 ENCOUNTER — Ambulatory Visit
Admission: RE | Admit: 2014-04-03 | Discharge: 2014-04-03 | Disposition: A | Discharge status: Home / Self Care | Payer: BC Managed Care – PPO | Source: Ambulatory Visit | Attending: Radiation Oncology | Admitting: Radiation Oncology

## 2014-04-03 ENCOUNTER — Ambulatory Visit (HOSPITAL_BASED_OUTPATIENT_CLINIC_OR_DEPARTMENT_OTHER): Payer: BC Managed Care – PPO

## 2014-04-03 VITALS — BP 142/90 | HR 100 | Temp 98.7°F

## 2014-04-03 DIAGNOSIS — Z510 Encounter for antineoplastic radiation therapy: Secondary | ICD-10-CM | POA: Diagnosis not present

## 2014-04-03 DIAGNOSIS — C773 Secondary and unspecified malignant neoplasm of axilla and upper limb lymph nodes: Secondary | ICD-10-CM

## 2014-04-03 DIAGNOSIS — C50519 Malignant neoplasm of lower-outer quadrant of unspecified female breast: Secondary | ICD-10-CM

## 2014-04-03 DIAGNOSIS — C50511 Malignant neoplasm of lower-outer quadrant of right female breast: Secondary | ICD-10-CM

## 2014-04-03 DIAGNOSIS — Z5112 Encounter for antineoplastic immunotherapy: Secondary | ICD-10-CM

## 2014-04-03 MED ORDER — DIPHENHYDRAMINE HCL 25 MG PO CAPS
ORAL_CAPSULE | ORAL | Status: AC
  Filled 2014-04-03: qty 2

## 2014-04-03 MED ORDER — ACETAMINOPHEN 325 MG PO TABS
ORAL_TABLET | ORAL | Status: AC
  Filled 2014-04-03: qty 2

## 2014-04-03 MED ORDER — SODIUM CHLORIDE 0.9 % IV SOLN
Freq: Once | INTRAVENOUS | Status: AC
  Administered 2014-04-03: 16:00:00 via INTRAVENOUS

## 2014-04-03 MED ORDER — DIPHENHYDRAMINE HCL 25 MG PO CAPS
50.0000 mg | ORAL_CAPSULE | Freq: Once | ORAL | Status: AC
  Administered 2014-04-03: 50 mg via ORAL

## 2014-04-03 MED ORDER — TRASTUZUMAB CHEMO INJECTION 440 MG
6.0000 mg/kg | Freq: Once | INTRAVENOUS | Status: AC
  Administered 2014-04-03: 546 mg via INTRAVENOUS
  Filled 2014-04-03: qty 26

## 2014-04-03 MED ORDER — ACETAMINOPHEN 325 MG PO TABS
650.0000 mg | ORAL_TABLET | Freq: Once | ORAL | Status: AC
  Administered 2014-04-03: 650 mg via ORAL

## 2014-04-03 NOTE — Patient Instructions (Signed)
[facility] Discharge Instructions for Patients Receiving Chemotherapy  Today you received the following chemotherapy agents herceptin  To help prevent nausea and vomiting after your treatment, we encourage you to take your nausea medication if needed.   If you develop nausea and vomiting that is not controlled by your nausea medication, call the clinic.   BELOW ARE SYMPTOMS THAT SHOULD BE REPORTED IMMEDIATELY:  *FEVER GREATER THAN 100.5 F  *CHILLS WITH OR WITHOUT FEVER  NAUSEA AND VOMITING THAT IS NOT CONTROLLED WITH YOUR NAUSEA MEDICATION  *UNUSUAL SHORTNESS OF BREATH  *UNUSUAL BRUISING OR BLEEDING  TENDERNESS IN MOUTH AND THROAT WITH OR WITHOUT PRESENCE OF ULCERS  *URINARY PROBLEMS  *BOWEL PROBLEMS  UNUSUAL RASH Items with * indicate a potential emergency and should be followed up as soon as possible.  Feel free to call the clinic you have any questions or concerns. The clinic phone number is [phone].    

## 2014-04-04 ENCOUNTER — Ambulatory Visit
Admission: RE | Admit: 2014-04-04 | Discharge: 2014-04-04 | Disposition: A | Discharge status: Home / Self Care | Payer: BC Managed Care – PPO | Source: Ambulatory Visit | Attending: Radiation Oncology | Admitting: Radiation Oncology

## 2014-04-04 ENCOUNTER — Ambulatory Visit: Payer: BC Managed Care – PPO

## 2014-04-04 DIAGNOSIS — Z510 Encounter for antineoplastic radiation therapy: Secondary | ICD-10-CM | POA: Diagnosis not present

## 2014-04-05 ENCOUNTER — Ambulatory Visit: Payer: BC Managed Care – PPO

## 2014-04-05 ENCOUNTER — Ambulatory Visit
Admission: RE | Admit: 2014-04-05 | Discharge: 2014-04-05 | Disposition: A | Discharge status: Home / Self Care | Payer: BC Managed Care – PPO | Source: Ambulatory Visit | Attending: Radiation Oncology | Admitting: Radiation Oncology

## 2014-04-05 DIAGNOSIS — Z510 Encounter for antineoplastic radiation therapy: Secondary | ICD-10-CM | POA: Diagnosis not present

## 2014-04-06 ENCOUNTER — Ambulatory Visit: Payer: BC Managed Care – PPO

## 2014-04-06 ENCOUNTER — Ambulatory Visit
Admission: RE | Admit: 2014-04-06 | Discharge: 2014-04-06 | Disposition: A | Discharge status: Home / Self Care | Payer: BC Managed Care – PPO | Source: Ambulatory Visit | Attending: Radiation Oncology | Admitting: Radiation Oncology

## 2014-04-06 VITALS — BP 109/71 | HR 99 | Temp 98.2°F | Resp 14 | Wt 201.0 lb

## 2014-04-06 DIAGNOSIS — C50511 Malignant neoplasm of lower-outer quadrant of right female breast: Secondary | ICD-10-CM

## 2014-04-06 DIAGNOSIS — Z510 Encounter for antineoplastic radiation therapy: Secondary | ICD-10-CM | POA: Diagnosis not present

## 2014-04-06 NOTE — Progress Notes (Signed)
Department of Radiation Oncology  Phone:  [phone] Fax:        [phone]  Weekly Treatment Note    Name: [name] Date: 0/97/3532 MRN: [id number] DOB: 1971/06/30   Current dose: 21.6 Gy  Current fraction: 12   MEDICATIONS: Current Outpatient Prescriptions  Medication Sig Dispense Refill  . ALPRAZolam (XANAX) 1 MG tablet Take 1 tablet (1 mg total) by mouth at bedtime as needed for anxiety.  30 tablet  0  . dexlansoprazole (DEXILANT) 60 MG capsule Take 60 mg by mouth daily.      Marland Kitchen dihydroergotamine (MIGRANAL) 4 MG/ML nasal spray Place 1 spray into the nose as needed for migraine. Use in one nostril as directed.  No more than 4 sprays in one hour      . Eflornithine HCl (VANIQA) 13.9 % cream Apply 1 application topically daily.      Marland Kitchen EPIPEN 2-PAK 0.3 MG/0.3ML SOAJ injection       . escitalopram (LEXAPRO) 10 MG tablet Take 1 tablet (10 mg total) by mouth daily.  30 tablet  5  . Goserelin Acetate (ZOLADEX [place]) Inject into the skin every 30 (thirty) days. Next injection due on May 12      . hyaluronate sodium (RADIAPLEXRX) GEL Apply 1 application topically 2 (two) times daily. Apply to skin area after rad txs daily and bedtime and on wekends      . levothyroxine (SYNTHROID, LEVOTHROID) 100 MCG tablet Take 100 mcg by mouth daily.       Marland Kitchen lidocaine-prilocaine (EMLA) cream Apply 1 application topically once as needed (if need for port).      Marland Kitchen liothyronine (CYTOMEL) 25 MCG tablet Take 12.5 mcg by mouth 2 (two) times daily.       Marland Kitchen LORazepam (ATIVAN) 0.5 MG tablet Take 1 tablet (0.5 mg total) by mouth every 6 (six) hours as needed for anxiety.  30 tablet  0  . methocarbamol (ROBAXIN) 500 MG tablet       . mometasone (NASONEX) 50 MCG/ACT nasal spray Place 2 sprays into both nostrils daily as needed (allergies).       . non-metallic deodorant Jethro Poling) MISC Apply 1 application topically daily. Apply after rad txs daily and prn      . Nutritional Supplements (JUICE PLUS FIBRE  PO) Take 3 tablets by mouth daily.       . ranitidine (ZANTAC) 300 MG tablet Take 300 mg by mouth at bedtime.      . topiramate (TOPAMAX) 100 MG tablet Take 200 mg by mouth at bedtime.       No current facility-administered medications for this encounter.     ALLERGIES: Imitrex; Amoxicillin; Bactrim; Cephalexin; Erythromycin; Penicillins; Strawberry; Sulfa antibiotics; Vancomycin; and Zithromax   LABORATORY DATA:  Lab Results  Component Value Date   WBC 5.8 03/13/2014   HGB 12.3 03/13/2014   HCT 37.8 03/13/2014   MCV 87.4 03/13/2014   PLT 173 03/13/2014   Lab Results  Component Value Date   NA 141 03/13/2014   K 3.5 03/13/2014   CL 106 01/23/2014   CO2 23 03/13/2014   Lab Results  Component Value Date   ALT 15 03/13/2014   AST 16 03/13/2014   ALKPHOS 80 03/13/2014   BILITOT 0.34 03/13/2014     NARRATIVE: Joan Valdez was seen today for weekly treatment management. The chart was checked and the patient's films were reviewed. The patient is doing well in terms of her treatment. No major changes in her skin.  She continues to use skin cream. The patient did have a couple of episodes of dizziness yesterday. None today.  PHYSICAL EXAMINATION: weight is 201 lb (91.173 kg). Her oral temperature is 98.2 F (36.8 C). Her blood pressure is 109/71 and her pulse is 99. Her respiration is 14 and oxygen saturation is 100%.      mild skin changes currently. No desquamation.  ASSESSMENT: The patient is doing satisfactorily with treatment.  PLAN: We will continue with the patient's radiation treatment as planned. The patient will let us know if she experiences any further dizziness. She will continue to drink plenty of fluids. Vital signs were okay today in clinic.

## 2014-04-06 NOTE — Progress Notes (Addendum)
She is currently in no pain. Pt complains of skin sensitiveness, itching with noted pink rash over the right breast also noted a small amount of green drainage via surgical incision. Pt reports some Loss of Sleep and Fatigue. She reports she had two episodes of vertigo, one after treatment yesterday where she fell against the wall, and the other last night when laying in bed.  She did not actually fall to the floor and wasn't injured during either event.  She reports she is drinking plenty of water.    No edema, or swollen lymph nodes noted. The patient eats a regular, healthy diet.Marland Kitchen

## 2014-04-09 ENCOUNTER — Ambulatory Visit
Admission: RE | Admit: 2014-04-09 | Discharge: 2014-04-09 | Disposition: A | Discharge status: Home / Self Care | Payer: BC Managed Care – PPO | Source: Ambulatory Visit | Attending: Radiation Oncology | Admitting: Radiation Oncology

## 2014-04-09 ENCOUNTER — Ambulatory Visit: Payer: BC Managed Care – PPO

## 2014-04-09 DIAGNOSIS — Z510 Encounter for antineoplastic radiation therapy: Secondary | ICD-10-CM | POA: Diagnosis not present

## 2014-04-10 ENCOUNTER — Ambulatory Visit
Admission: RE | Admit: 2014-04-10 | Discharge: 2014-04-10 | Disposition: A | Discharge status: Home / Self Care | Payer: BC Managed Care – PPO | Source: Ambulatory Visit | Attending: Radiation Oncology | Admitting: Radiation Oncology

## 2014-04-10 ENCOUNTER — Encounter (HOSPITAL_COMMUNITY): Payer: Self-pay | Admitting: Vascular Surgery

## 2014-04-10 ENCOUNTER — Ambulatory Visit (INDEPENDENT_AMBULATORY_CARE_PROVIDER_SITE_OTHER): Payer: BC Managed Care – PPO | Admitting: Family Medicine

## 2014-04-10 ENCOUNTER — Ambulatory Visit: Payer: BC Managed Care – PPO

## 2014-04-10 ENCOUNTER — Encounter: Payer: Self-pay | Admitting: Family Medicine

## 2014-04-10 VITALS — BP 130/78 | HR 82 | Temp 96.9°F | Resp 18 | Ht 62.0 in | Wt 199.0 lb

## 2014-04-10 DIAGNOSIS — H8110 Benign paroxysmal vertigo, unspecified ear: Secondary | ICD-10-CM

## 2014-04-10 DIAGNOSIS — F329 Major depressive disorder, single episode, unspecified: Secondary | ICD-10-CM

## 2014-04-10 DIAGNOSIS — F3289 Other specified depressive episodes: Secondary | ICD-10-CM

## 2014-04-10 DIAGNOSIS — F32A Depression, unspecified: Secondary | ICD-10-CM

## 2014-04-10 DIAGNOSIS — Z510 Encounter for antineoplastic radiation therapy: Secondary | ICD-10-CM | POA: Diagnosis not present

## 2014-04-10 MED ORDER — ALPRAZOLAM 1 MG PO TABS: 1.0000 mg | ORAL_TABLET | Freq: Every evening | ORAL | Status: DC | PRN

## 2014-04-10 MED ORDER — ESCITALOPRAM OXALATE 10 MG PO TABS: 10.0000 mg | ORAL_TABLET | Freq: Every day | ORAL | Status: DC

## 2014-04-10 MED ORDER — MECLIZINE HCL 25 MG PO TABS: 25.0000 mg | ORAL_TABLET | Freq: Three times a day (TID) | ORAL | Status: DC | PRN

## 2014-04-10 NOTE — Progress Notes (Signed)
Subjective:    Patient ID: Joan Valdez, female    DOB: 04/06/1971, 43 y.o.   MRN: [id number]  HPI 02/2014 Patient is a very pleasant 43 year old white female who is battling stage IIIa breast cancer. Recently over the last few months she has developed depression and anhedonia. She is also developed insomnia. She has poor appetite. She had increasing anxiety. She has no energy. She has poor concentration. She denies any suicidal ideation. She does report frequent mood swings and frequent crying spells. She does have a past medical history of depression.  At that time, my plan was: 1. Depression Begin Lexapro 10 mg by mouth each bedtime. The patient can use Xanax 1 mg tablets. She can take half a tablet or a whole tablet at night as needed for insomnia or anxiety. I would like to recheck the patient in 3-4 weeks or sooner/immediately if worse. - escitalopram (LEXAPRO) 10 MG tablet; Take 1 tablet (10 mg total) by mouth daily.  Dispense: 30 tablet; Refill: 5 - ALPRAZolam (XANAX) 1 MG tablet; Take 1 tablet (1 mg total) by mouth at bedtime as needed for anxiety.  Dispense: 30 tablet; Refill: 0  04/10/14 Patient's depression is much better. She still needs the Xanax to help her sleep at night. She is under a tremendous amount of stress. Her husband is looking for a new job. She is currently undergoing radiation therapy. I'll be glad to refill the Xanax for her insomnia. Last week she's had episodic vertigo. Every time, the vertigo is triggered by position changes. It occurs when she lies down. It occurs when she turns her head. It occurs with position changes of her body. She denies any hearing loss. She denies any tinnitus. He does have some mild sinus pressure but she denies any fevers or chills. Past Medical History  Diagnosis Date  . PCO (polycystic ovaries)   . Allergic rhinitis     takes Zyrtec daily and nasal spray as needed  . Cervical dysplasia   . Cancer     ;Breast cancer; pre-cancerous  cervical cells  . 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 Insomnia     takes Ambien nightly  . Hypothyroid     takes Synthroid daily  . Acid reflux     takes Zantac nightly and Dexilant daily  . Family history of anesthesia complication     "mom has PONV  . Chronic headache     takes topamax nightly;last migraine in Dec 2014  . Kidney stones 2013/01/03    "passed on their own"   Current Outpatient Prescriptions on File Prior to Visit  Medication Sig Dispense Refill  . dexlansoprazole (DEXILANT) 60 MG capsule Take 60 mg by mouth daily.      Marland Kitchen dihydroergotamine (MIGRANAL) 4 MG/ML nasal spray Place 1 spray into the nose as needed for migraine. Use in one nostril as directed.  No more than 4 sprays in one hour      . Eflornithine HCl (VANIQA) 13.9 % cream Apply 1 application topically daily.      Marland Kitchen EPIPEN 2-PAK 0.3 MG/0.3ML SOAJ injection       . Goserelin Acetate (ZOLADEX [place]) Inject into the skin every 30 (thirty) days. Next injection due on May 12      . hyaluronate sodium (RADIAPLEXRX) GEL Apply 1 application topically 2 (two) times daily. Apply to skin area after rad txs daily and bedtime and on wekends      . levothyroxine (SYNTHROID,  LEVOTHROID) 100 MCG tablet Take 100 mcg by mouth daily.       Marland Kitchen lidocaine-prilocaine (EMLA) cream Apply 1 application topically once as needed (if need for port).      Marland Kitchen liothyronine (CYTOMEL) 25 MCG tablet Take 12.5 mcg by mouth 2 (two) times daily.       Marland Kitchen LORazepam (ATIVAN) 0.5 MG tablet Take 1 tablet (0.5 mg total) by mouth every 6 (six) hours as needed for anxiety.  30 tablet  0  . methocarbamol (ROBAXIN) 500 MG tablet       . mometasone (NASONEX) 50 MCG/ACT nasal spray Place 2 sprays into both nostrils daily as needed (allergies).       . non-metallic deodorant Jethro Poling) MISC Apply 1 application topically daily. Apply after rad txs daily and prn      . Nutritional Supplements (JUICE PLUS FIBRE PO) Take 3 tablets by mouth daily.       .  ranitidine (ZANTAC) 300 MG tablet Take 300 mg by mouth at bedtime.      . topiramate (TOPAMAX) 100 MG tablet Take 200 mg by mouth at bedtime.       No current facility-administered medications on file prior to visit.   Allergies  Allergen Reactions  . Imitrex [Sumatriptan Base] Anaphylaxis  . Amoxicillin Hives  . Bactrim Hives  . Cephalexin Hives  . Erythromycin Hives  . Penicillins Hives  . Strawberry Hives  . Sulfa Antibiotics Hives  . Vancomycin Itching  . Zithromax [Azithromycin Dihydrate] Hives   History   Social History  . Marital Status: Married    Spouse Name: N/A    Number of Children: 1  . Years of Education: N/A   Occupational History  . Pharmacist, hospital   .  [place] History Main Topics  . Smoking status: Never Smoker   . Smokeless tobacco: Never Used  . Alcohol Use: Yes     Comment: 02/01/2014 "none in the last 6 months; usually have a couple glasses of wine twice/wk"  . Drug Use: No  . Sexual Activity: Not Currently    Birth Control/ Protection: Other-see comments   Other Topics Concern  . Not on file   Social History Narrative  . No narrative on file      Review of Systems  All other systems reviewed and are negative.      Objective:   Physical Exam  Vitals reviewed. Cardiovascular: Normal rate, regular rhythm and normal heart sounds.   Pulmonary/Chest: Breath sounds normal. No respiratory distress. She has no wheezes. She has no rales.          Assessment & Plan:   1. Depression Continue Lexapro 10 mg by mouth daily. I gave the patient additional 30 tablets of Xanax to be used as needed for insomnia. If she continues to experience insomnia consider trazodone. - ALPRAZolam (XANAX) 1 MG tablet; Take 1 tablet (1 mg total) by mouth at bedtime as needed for anxiety.  Dispense: 30 tablet; Refill: 0 - escitalopram (LEXAPRO) 10 MG tablet; Take 1 tablet (10 mg total) by mouth daily.  Dispense: 30 tablet; Refill: 5  2. BPPV  (benign paroxysmal positional vertigo), unspecified laterality Anticipate spontaneous resolution of the next 7-10 days. Begin meclizine 25 mg every 8 hours as needed for vertigo for symptomatic relief. - meclizine (ANTIVERT) 25 MG tablet; Take 1 tablet (25 mg total) by mouth 3 (three) times daily as needed for dizziness.  Dispense: 30 tablet; Refill: 0

## 2014-04-11 ENCOUNTER — Ambulatory Visit: Payer: BC Managed Care – PPO

## 2014-04-11 ENCOUNTER — Ambulatory Visit
Admission: RE | Admit: 2014-04-11 | Discharge: 2014-04-11 | Disposition: A | Discharge status: Home / Self Care | Payer: BC Managed Care – PPO | Source: Ambulatory Visit | Attending: Radiation Oncology | Admitting: Radiation Oncology

## 2014-04-11 DIAGNOSIS — Z510 Encounter for antineoplastic radiation therapy: Secondary | ICD-10-CM | POA: Diagnosis not present

## 2014-04-12 ENCOUNTER — Ambulatory Visit: Payer: BC Managed Care – PPO

## 2014-04-12 ENCOUNTER — Ambulatory Visit
Admission: RE | Admit: 2014-04-12 | Discharge: 2014-04-12 | Disposition: A | Discharge status: Home / Self Care | Payer: BC Managed Care – PPO | Source: Ambulatory Visit | Attending: Radiation Oncology | Admitting: Radiation Oncology

## 2014-04-12 ENCOUNTER — Encounter: Payer: Self-pay | Admitting: Oncology

## 2014-04-12 DIAGNOSIS — Z510 Encounter for antineoplastic radiation therapy: Secondary | ICD-10-CM | POA: Diagnosis not present

## 2014-04-12 NOTE — Progress Notes (Signed)
Put disability paper on nurse's desk.

## 2014-04-13 ENCOUNTER — Ambulatory Visit
Admission: RE | Admit: 2014-04-13 | Discharge: 2014-04-13 | Disposition: A | Discharge status: Home / Self Care | Payer: BC Managed Care – PPO | Source: Ambulatory Visit | Attending: Radiation Oncology | Admitting: Radiation Oncology

## 2014-04-13 ENCOUNTER — Ambulatory Visit: Payer: BC Managed Care – PPO

## 2014-04-13 ENCOUNTER — Encounter: Payer: Self-pay | Admitting: Radiation Oncology

## 2014-04-13 VITALS — BP 104/66 | HR 84 | Temp 98.1°F | Resp 20 | Wt 200.1 lb

## 2014-04-13 DIAGNOSIS — Z510 Encounter for antineoplastic radiation therapy: Secondary | ICD-10-CM | POA: Diagnosis not present

## 2014-04-13 DIAGNOSIS — C50511 Malignant neoplasm of lower-outer quadrant of right female breast: Secondary | ICD-10-CM

## 2014-04-13 MED ORDER — RADIAPLEXRX EX GEL
Freq: Once | CUTANEOUS | Status: AC
  Administered 2014-04-13: 11:00:00 via TOPICAL

## 2014-04-13 NOTE — Progress Notes (Signed)
Department of Radiation Oncology  Phone:  [phone] Fax:        [phone]  Weekly Treatment Note    Name: Joan Valdez Date: 0/05/3817 MRN: [id number] DOB: [birth year]   Current dose: 30.6 Gy  Current fraction: 17   MEDICATIONS: Current Outpatient Prescriptions  Medication Sig Dispense Refill  . ALPRAZolam (XANAX) 1 MG tablet Take 1 tablet (1 mg total) by mouth at bedtime as needed for anxiety.  30 tablet  0  . dexlansoprazole (DEXILANT) 60 MG capsule Take 60 mg by mouth daily.      Marland Kitchen dihydroergotamine (MIGRANAL) 4 MG/ML nasal spray Place 1 spray into the nose as needed for migraine. Use in one nostril as directed.  No more than 4 sprays in one hour      . Eflornithine HCl (VANIQA) 13.9 % cream Apply 1 application topically daily.      Marland Kitchen EPIPEN 2-PAK 0.3 MG/0.3ML SOAJ injection       . escitalopram (LEXAPRO) 10 MG tablet Take 1 tablet (10 mg total) by mouth daily.  30 tablet  5  . Goserelin Acetate (ZOLADEX [place]) Inject into the skin every 30 (thirty) days. Next injection due on May 12      . hyaluronate sodium (RADIAPLEXRX) GEL Apply 1 application topically 2 (two) times daily. Apply to skin area after rad txs daily and bedtime and on wekends,2nd tube given 04/13/14      . levothyroxine (SYNTHROID, LEVOTHROID) 100 MCG tablet Take 100 mcg by mouth daily.       Marland Kitchen lidocaine-prilocaine (EMLA) cream Apply 1 application topically once as needed (if need for port).      Marland Kitchen liothyronine (CYTOMEL) 25 MCG tablet Take 12.5 mcg by mouth 2 (two) times daily.       Marland Kitchen LORazepam (ATIVAN) 0.5 MG tablet Take 1 tablet (0.5 mg total) by mouth every 6 (six) hours as needed for anxiety.  30 tablet  0  . methocarbamol (ROBAXIN) 500 MG tablet       . mometasone (NASONEX) 50 MCG/ACT nasal spray Place 2 sprays into both nostrils daily as needed (allergies).       . non-metallic deodorant Jethro Poling) MISC Apply 1 application topically daily. Apply after rad txs daily and prn      . Nutritional  Supplements (JUICE PLUS FIBRE PO) Take 3 tablets by mouth daily.       . ranitidine (ZANTAC) 300 MG tablet Take 300 mg by mouth at bedtime.      . topiramate (TOPAMAX) 100 MG tablet Take 200 mg by mouth at bedtime.      . meclizine (ANTIVERT) 25 MG tablet Take 1 tablet (25 mg total) by mouth 3 (three) times daily as needed for dizziness.  30 tablet  0   No current facility-administered medications for this encounter.     ALLERGIES: Imitrex; Amoxicillin; Bactrim; Cephalexin; Erythromycin; Penicillins; Strawberry; Sulfa antibiotics; Vancomycin; and Zithromax   LABORATORY DATA:  Lab Results  Component Value Date   WBC 5.8 03/13/2014   HGB 12.3 03/13/2014   HCT 37.8 03/13/2014   MCV 87.4 03/13/2014   PLT 173 03/13/2014   Lab Results  Component Value Date   NA 141 03/13/2014   K 3.5 03/13/2014   CL 106 01/23/2014   CO2 23 03/13/2014   Lab Results  Component Value Date   ALT 15 03/13/2014   AST 16 03/13/2014   ALKPHOS 80 03/13/2014   BILITOT 0.34 03/13/2014     NARRATIVE: Joan Valdez was seen  today for weekly treatment management. The chart was checked and the patient's films were reviewed. The patient states she is doing very well. She has noticed mild irritation but no major changes in terms of her skin. She now is on some medication as needed for dizziness.  PHYSICAL EXAMINATION: weight is 200 lb 1.6 oz (90.765 kg). Her oral temperature is 98.1 F (36.7 C). Her blood pressure is 104/66 and her pulse is 84. Her respiration is 20.      mild skin change at this point with some hyperpigmentation. Overall her skin looks excellent.  ASSESSMENT: The patient is doing satisfactorily with treatment.  PLAN: We will continue with the patient's radiation treatment as planned. She will continue her current skin care.

## 2014-04-13 NOTE — Progress Notes (Signed)
Weekly rad txs right subcl/breast area, mild erythema,skin intact,using radiaplex gel bid, gave  another tube, no pain or nausea, went to her family MD has BPPV=vertigo, gave her meclizine o take prn dizziness, "only happens when I lay down and get up from bed or rad table, " only lasts a few seconds,appetite good,  10:27 AM

## 2014-04-16 ENCOUNTER — Ambulatory Visit
Admission: RE | Admit: 2014-04-16 | Discharge: 2014-04-16 | Disposition: A | Discharge status: Home / Self Care | Payer: BC Managed Care – PPO | Source: Ambulatory Visit | Attending: Radiation Oncology | Admitting: Radiation Oncology

## 2014-04-16 ENCOUNTER — Encounter: Payer: Self-pay | Admitting: Oncology

## 2014-04-16 ENCOUNTER — Ambulatory Visit: Payer: BC Managed Care – PPO

## 2014-04-16 DIAGNOSIS — Z510 Encounter for antineoplastic radiation therapy: Secondary | ICD-10-CM | POA: Diagnosis not present

## 2014-04-16 NOTE — Progress Notes (Signed)
Faxed disability form to Jeannie Greer @ [phone]

## 2014-04-17 ENCOUNTER — Ambulatory Visit: Payer: BC Managed Care – PPO

## 2014-04-17 ENCOUNTER — Ambulatory Visit
Admission: RE | Admit: 2014-04-17 | Discharge: 2014-04-17 | Disposition: A | Discharge status: Home / Self Care | Payer: BC Managed Care – PPO | Source: Ambulatory Visit | Attending: Radiation Oncology | Admitting: Radiation Oncology

## 2014-04-17 ENCOUNTER — Ambulatory Visit (HOSPITAL_BASED_OUTPATIENT_CLINIC_OR_DEPARTMENT_OTHER): Payer: BC Managed Care – PPO

## 2014-04-17 VITALS — BP 125/67 | HR 89 | Temp 98.0°F

## 2014-04-17 DIAGNOSIS — C50511 Malignant neoplasm of lower-outer quadrant of right female breast: Secondary | ICD-10-CM

## 2014-04-17 DIAGNOSIS — Z510 Encounter for antineoplastic radiation therapy: Secondary | ICD-10-CM | POA: Diagnosis not present

## 2014-04-17 DIAGNOSIS — C773 Secondary and unspecified malignant neoplasm of axilla and upper limb lymph nodes: Secondary | ICD-10-CM

## 2014-04-17 DIAGNOSIS — Z5111 Encounter for antineoplastic chemotherapy: Secondary | ICD-10-CM

## 2014-04-17 DIAGNOSIS — C50519 Malignant neoplasm of lower-outer quadrant of unspecified female breast: Secondary | ICD-10-CM

## 2014-04-17 MED ORDER — GOSERELIN ACETATE 3.6 MG ~~LOC~~ IMPL
3.6000 mg | DRUG_IMPLANT | Freq: Once | SUBCUTANEOUS | Status: AC
  Administered 2014-04-17: 3.6 mg via SUBCUTANEOUS
  Filled 2014-04-17: qty 3.6

## 2014-04-17 NOTE — Patient Instructions (Signed)
Goserelin injection What is this medicine? GOSERELIN (GOE se rel in) is similar to a hormone found in the body. It lowers the amount of sex hormones that the body makes. Men will have lower testosterone levels and women will have lower estrogen levels while taking this medicine. In men, this medicine is used to treat prostate cancer; the injection is either given once per month or once every 12 weeks. A once per month injection (only) is used to treat women with endometriosis, dysfunctional uterine bleeding, or advanced breast cancer. This medicine may be used for other purposes; ask your health care provider or pharmacist if you have questions. COMMON BRAND NAME(S): Zoladex What should I tell my health care provider before I take this medicine? They need to know if you have any of these conditions (some only apply to women): -diabetes -heart disease or previous heart attack -high blood pressure -high cholesterol -kidney disease -osteoporosis or low bone density -problems passing urine -spinal cord injury -stroke -tobacco smoker -an unusual or allergic reaction to goserelin, hormone therapy, other medicines, foods, dyes, or preservatives -pregnant or trying to get pregnant -breast-feeding How should I use this medicine? This medicine is for injection under the skin. It is given by a health care professional in a hospital or clinic setting. Men receive this injection once every 4 weeks or once every 12 weeks. Women will only receive the once every 4 weeks injection. Talk to your pediatrician regarding the use of this medicine in children. Special care may be needed. Overdosage: If you think you have taken too much of this medicine contact a poison control center or emergency room at once. NOTE: This medicine is only for you. Do not share this medicine with others. What if I miss a dose? It is important not to miss your dose. Call your doctor or health care professional if you are unable to  keep an appointment. What may interact with this medicine? -female hormones like estrogen -herbal or dietary supplements like black cohosh, chasteberry, or DHEA -female hormones like testosterone -prasterone This list may not describe all possible interactions. Give your health care provider a list of all the medicines, herbs, non-prescription drugs, or dietary supplements you use. Also tell them if you smoke, drink alcohol, or use illegal drugs. Some items may interact with your medicine. What should I watch for while using this medicine? Visit your doctor or health care professional for regular checks on your progress. Your symptoms may appear to get worse during the first weeks of this therapy. Tell your doctor or healthcare professional if your symptoms do not start to get better or if they get worse after this time. Your bones may get weaker if you take this medicine for a long time. If you smoke or frequently drink alcohol you may increase your risk of bone loss. A family history of osteoporosis, chronic use of drugs for seizures (convulsions), or corticosteroids can also increase your risk of bone loss. Talk to your doctor about how to keep your bones strong. This medicine should stop regular monthly menstration in women. Tell your doctor if you continue to menstrate. Women should not become pregnant while taking this medicine or for 12 weeks after stopping this medicine. Women should inform their doctor if they wish to become pregnant or think they might be pregnant. There is a potential for serious side effects to an unborn child. Talk to your health care professional or pharmacist for more information. Do not breast-feed an infant while taking   this medicine. Men should inform their doctors if they wish to father a child. This medicine may lower sperm counts. Talk to your health care professional or pharmacist for more information. What side effects may I notice from receiving this  medicine? Side effects that you should report to your doctor or health care professional as soon as possible: -allergic reactions like skin rash, itching or hives, swelling of the face, lips, or tongue -bone pain -breathing problems -changes in vision -chest pain -feeling faint or lightheaded, falls -fever, chills -pain, swelling, warmth in the leg -pain, tingling, numbness in the hands or feet -signs and symptoms of low blood pressure like dizziness; feeling faint or lightheaded, falls; unusually weak or tired -stomach pain -swelling of the ankles, feet, hands -trouble passing urine or change in the amount of urine -unusually high or low blood pressure -unusually weak or tired Side effects that usually do not require medical attention (report to your doctor or health care professional if they continue or are bothersome): -change in sex drive or performance -changes in breast size in both males and females -changes in emotions or moods -headache -hot flashes -irritation at site where injected -loss of appetite -skin problems like acne, dry skin -vaginal dryness This list may not describe all possible side effects. Call your doctor for medical advice about side effects. You may report side effects to FDA at 1-800-FDA-1088. Where should I keep my medicine? This drug is given in a hospital or clinic and will not be stored at home. NOTE: This sheet is a summary. It may not cover all possible information. If you have questions about this medicine, talk to your doctor, pharmacist, or health care provider.  2015, [employer]/Gold Standard. (2013-11-07 11:10:35)  

## 2014-04-18 ENCOUNTER — Ambulatory Visit: Payer: BC Managed Care – PPO

## 2014-04-18 ENCOUNTER — Ambulatory Visit
Admission: RE | Admit: 2014-04-18 | Discharge: 2014-04-18 | Disposition: A | Discharge status: Home / Self Care | Payer: BC Managed Care – PPO | Source: Ambulatory Visit | Attending: Radiation Oncology | Admitting: Radiation Oncology

## 2014-04-18 DIAGNOSIS — Z510 Encounter for antineoplastic radiation therapy: Secondary | ICD-10-CM | POA: Diagnosis not present

## 2014-04-19 ENCOUNTER — Ambulatory Visit: Payer: BC Managed Care – PPO

## 2014-04-19 ENCOUNTER — Ambulatory Visit
Admission: RE | Admit: 2014-04-19 | Discharge: 2014-04-19 | Disposition: A | Discharge status: Home / Self Care | Payer: BC Managed Care – PPO | Source: Ambulatory Visit | Attending: Radiation Oncology | Admitting: Radiation Oncology

## 2014-04-19 DIAGNOSIS — Z510 Encounter for antineoplastic radiation therapy: Secondary | ICD-10-CM | POA: Diagnosis not present

## 2014-04-20 ENCOUNTER — Ambulatory Visit
Admission: RE | Admit: 2014-04-20 | Discharge: 2014-04-20 | Disposition: A | Discharge status: Home / Self Care | Payer: BC Managed Care – PPO | Source: Ambulatory Visit | Attending: Radiation Oncology | Admitting: Radiation Oncology

## 2014-04-20 ENCOUNTER — Encounter: Payer: Self-pay | Admitting: Radiation Oncology

## 2014-04-20 ENCOUNTER — Ambulatory Visit: Payer: BC Managed Care – PPO

## 2014-04-20 VITALS — BP 124/77 | HR 76 | Temp 97.9°F | Resp 20 | Wt 201.9 lb

## 2014-04-20 DIAGNOSIS — Z510 Encounter for antineoplastic radiation therapy: Secondary | ICD-10-CM | POA: Diagnosis not present

## 2014-04-20 DIAGNOSIS — C50511 Malignant neoplasm of lower-outer quadrant of right female breast: Secondary | ICD-10-CM

## 2014-04-20 NOTE — Progress Notes (Signed)
Department of Radiation Oncology  Phone:  [phone] Fax:        [phone]  Weekly Treatment Note    Name: Joan Valdez Date: 1/0/6269 MRN: [id number] DOB: [birth year]   Current dose: 39.6 Gy  Current fraction: 22   MEDICATIONS: Current Outpatient Prescriptions  Medication Sig Dispense Refill  . ALPRAZolam (XANAX) 1 MG tablet Take 1 tablet (1 mg total) by mouth at bedtime as needed for anxiety.  30 tablet  0  . dexlansoprazole (DEXILANT) 60 MG capsule Take 60 mg by mouth daily.      Marland Kitchen dihydroergotamine (MIGRANAL) 4 MG/ML nasal spray Place 1 spray into the nose as needed for migraine. Use in one nostril as directed.  No more than 4 sprays in one hour      . Eflornithine HCl (VANIQA) 13.9 % cream Apply 1 application topically daily.      Marland Kitchen EPIPEN 2-PAK 0.3 MG/0.3ML SOAJ injection       . escitalopram (LEXAPRO) 10 MG tablet Take 1 tablet (10 mg total) by mouth daily.  30 tablet  5  . Goserelin Acetate (ZOLADEX [place]) Inject into the skin every 30 (thirty) days. Next injection due on May 12      . hyaluronate sodium (RADIAPLEXRX) GEL Apply 1 application topically 2 (two) times daily. Apply to skin area after rad txs daily and bedtime and on wekends,2nd tube given 04/13/14      . levothyroxine (SYNTHROID, LEVOTHROID) 100 MCG tablet Take 100 mcg by mouth daily.       Marland Kitchen lidocaine-prilocaine (EMLA) cream Apply 1 application topically once as needed (if need for port).      Marland Kitchen liothyronine (CYTOMEL) 25 MCG tablet Take 12.5 mcg by mouth 2 (two) times daily.       Marland Kitchen LORazepam (ATIVAN) 0.5 MG tablet Take 1 tablet (0.5 mg total) by mouth every 6 (six) hours as needed for anxiety.  30 tablet  0  . meclizine (ANTIVERT) 25 MG tablet Take 1 tablet (25 mg total) by mouth 3 (three) times daily as needed for dizziness.  30 tablet  0  . methocarbamol (ROBAXIN) 500 MG tablet       . mometasone (NASONEX) 50 MCG/ACT nasal spray Place 2 sprays into both nostrils daily as needed (allergies).       .  non-metallic deodorant Jethro Poling) MISC Apply 1 application topically daily. Apply after rad txs daily and prn      . Nutritional Supplements (JUICE PLUS FIBRE PO) Take 3 tablets by mouth daily.       . ranitidine (ZANTAC) 300 MG tablet Take 300 mg by mouth at bedtime.      . topiramate (TOPAMAX) 100 MG tablet Take 200 mg by mouth at bedtime.       No current facility-administered medications for this encounter.     ALLERGIES: Imitrex; Amoxicillin; Bactrim; Cephalexin; Erythromycin; Penicillins; Strawberry; Sulfa antibiotics; Vancomycin; and Zithromax   LABORATORY DATA:  Lab Results  Component Value Date   WBC 5.8 03/13/2014   HGB 12.3 03/13/2014   HCT 37.8 03/13/2014   MCV 87.4 03/13/2014   PLT 173 03/13/2014   Lab Results  Component Value Date   NA 141 03/13/2014   K 3.5 03/13/2014   CL 106 01/23/2014   CO2 23 03/13/2014   Lab Results  Component Value Date   ALT 15 03/13/2014   AST 16 03/13/2014   ALKPHOS 80 03/13/2014   BILITOT 0.34 03/13/2014     NARRATIVE: Joan Valdez was seen  today for weekly treatment management. The chart was checked and the patient's films were reviewed. The patient states that she continues to do well with treatment. Some increased itching. She is using hydrocortisone cream as needed.  PHYSICAL EXAMINATION: weight is 201 lb 14.4 oz (91.581 kg). Her oral temperature is 97.9 F (36.6 C). Her blood pressure is 124/77 and her pulse is 76. Her respiration is 20.      overall her skin looks excellent. Some radiation dermatitis present. No desquamation.  ASSESSMENT: The patient is doing satisfactorily with treatment.  PLAN: We will continue with the patient's radiation treatment as planned.

## 2014-04-20 NOTE — Progress Notes (Addendum)
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

## 2014-04-23 ENCOUNTER — Ambulatory Visit
Admission: RE | Admit: 2014-04-23 | Discharge: 2014-04-23 | Disposition: A | Discharge status: Home / Self Care | Payer: BC Managed Care – PPO | Source: Ambulatory Visit | Attending: Radiation Oncology | Admitting: Radiation Oncology

## 2014-04-23 ENCOUNTER — Ambulatory Visit: Payer: BC Managed Care – PPO

## 2014-04-23 DIAGNOSIS — Z510 Encounter for antineoplastic radiation therapy: Secondary | ICD-10-CM | POA: Diagnosis not present

## 2014-04-24 ENCOUNTER — Ambulatory Visit: Payer: BC Managed Care – PPO

## 2014-04-24 ENCOUNTER — Ambulatory Visit
Admission: RE | Admit: 2014-04-24 | Discharge: 2014-04-24 | Disposition: A | Discharge status: Home / Self Care | Payer: BC Managed Care – PPO | Source: Ambulatory Visit | Attending: Radiation Oncology | Admitting: Radiation Oncology

## 2014-04-24 ENCOUNTER — Ambulatory Visit (HOSPITAL_BASED_OUTPATIENT_CLINIC_OR_DEPARTMENT_OTHER): Payer: BC Managed Care – PPO

## 2014-04-24 ENCOUNTER — Other Ambulatory Visit: Payer: Self-pay | Admitting: Oncology

## 2014-04-24 VITALS — BP 122/74 | HR 101 | Temp 98.0°F | Resp 18

## 2014-04-24 DIAGNOSIS — Z510 Encounter for antineoplastic radiation therapy: Secondary | ICD-10-CM | POA: Diagnosis not present

## 2014-04-24 DIAGNOSIS — C773 Secondary and unspecified malignant neoplasm of axilla and upper limb lymph nodes: Secondary | ICD-10-CM

## 2014-04-24 DIAGNOSIS — C50519 Malignant neoplasm of lower-outer quadrant of unspecified female breast: Secondary | ICD-10-CM

## 2014-04-24 DIAGNOSIS — C50511 Malignant neoplasm of lower-outer quadrant of right female breast: Secondary | ICD-10-CM

## 2014-04-24 DIAGNOSIS — Z5112 Encounter for antineoplastic immunotherapy: Secondary | ICD-10-CM

## 2014-04-24 MED ORDER — SODIUM CHLORIDE 0.9 % IV SOLN
Freq: Once | INTRAVENOUS | Status: AC
  Administered 2014-04-24: 16:00:00 via INTRAVENOUS

## 2014-04-24 MED ORDER — HEPARIN SOD (PORK) LOCK FLUSH 100 UNIT/ML IV SOLN
500.0000 [IU] | Freq: Once | INTRAVENOUS | Status: AC | PRN
  Administered 2014-04-24: 500 [IU]
  Filled 2014-04-24: qty 5

## 2014-04-24 MED ORDER — SODIUM CHLORIDE 0.9 % IJ SOLN
10.0000 mL | INTRAMUSCULAR | Status: DC | PRN
  Administered 2014-04-24: 10 mL
  Filled 2014-04-24: qty 10

## 2014-04-24 MED ORDER — DIPHENHYDRAMINE HCL 25 MG PO CAPS
50.0000 mg | ORAL_CAPSULE | Freq: Once | ORAL | Status: AC
  Administered 2014-04-24: 50 mg via ORAL

## 2014-04-24 MED ORDER — DIPHENHYDRAMINE HCL 25 MG PO CAPS
ORAL_CAPSULE | ORAL | Status: AC
  Filled 2014-04-24: qty 2

## 2014-04-24 MED ORDER — ACETAMINOPHEN 325 MG PO TABS
650.0000 mg | ORAL_TABLET | Freq: Once | ORAL | Status: AC
  Administered 2014-04-24: 650 mg via ORAL

## 2014-04-24 MED ORDER — ACETAMINOPHEN 325 MG PO TABS
ORAL_TABLET | ORAL | Status: AC
  Filled 2014-04-24: qty 2

## 2014-04-24 MED ORDER — TRASTUZUMAB CHEMO INJECTION 440 MG
6.0000 mg/kg | Freq: Once | INTRAVENOUS | Status: AC
  Administered 2014-04-24: 546 mg via INTRAVENOUS
  Filled 2014-04-24: qty 26

## 2014-04-24 NOTE — Patient Instructions (Signed)
[facility] Discharge Instructions for Patients Receiving Chemotherapy  Today you received the following chemotherapy agents Herceptin  To help prevent nausea and vomiting after your treatment, we encourage you to take your nausea medication Zofran as neededIf you develop nausea and vomiting that is not controlled by your nausea medication, call the clinic.   BELOW ARE SYMPTOMS THAT SHOULD BE REPORTED IMMEDIATELY:  *FEVER GREATER THAN 100.5 F  *CHILLS WITH OR WITHOUT FEVER  NAUSEA AND VOMITING THAT IS NOT CONTROLLED WITH YOUR NAUSEA MEDICATION  *UNUSUAL SHORTNESS OF BREATH  *UNUSUAL BRUISING OR BLEEDING  TENDERNESS IN MOUTH AND THROAT WITH OR WITHOUT PRESENCE OF ULCERS  *URINARY PROBLEMS  *BOWEL PROBLEMS  UNUSUAL RASH Items with * indicate a potential emergency and should be followed up as soon as possible.  Feel free to call the clinic you have any questions or concerns. The clinic phone number is [phone].

## 2014-04-25 ENCOUNTER — Encounter: Payer: Self-pay | Admitting: Radiation Oncology

## 2014-04-25 ENCOUNTER — Ambulatory Visit
Admission: RE | Admit: 2014-04-25 | Discharge: 2014-04-25 | Disposition: A | Discharge status: Home / Self Care | Payer: BC Managed Care – PPO | Source: Ambulatory Visit | Attending: Radiation Oncology | Admitting: Radiation Oncology

## 2014-04-25 ENCOUNTER — Ambulatory Visit: Payer: BC Managed Care – PPO

## 2014-04-25 DIAGNOSIS — C50511 Malignant neoplasm of lower-outer quadrant of right female breast: Secondary | ICD-10-CM

## 2014-04-25 DIAGNOSIS — Z510 Encounter for antineoplastic radiation therapy: Secondary | ICD-10-CM | POA: Diagnosis not present

## 2014-04-25 NOTE — Progress Notes (Deleted)
  Radiation Oncology         [phone] ________________________________  Name: Collette Pescador MRN: [id number]  Date: 04/25/2014  DOB: [birth year]  SIMULATION AND TREATMENT PLANNING NOTE   CONSENT VERIFIED: yes   SET UP: Patient is set-up supine   IMMOBILIZATION: The following immobilization is used:Aquaplast Mask. This complex treatment device will be used on a daily basis during the patient's treatment.  Diagnosis:  Squamous cell carcinoma of the larynx   NARRATIVE: The patient was brought to the [facility].  Identity was confirmed.  All relevant records and images related to the planned course of therapy were reviewed.  Then, the patient was positioned in a stable reproducible clinical set-up for radiation therapy using an aquaplast mask.  Skin markings were placed.  The CT images were loaded into the planning software where the target and avoidance structures were contoured.The radiation prescription was entered and confirmed.  The patient will receive 69.96 Gy in 33 fractions.  Daily image guidance is ordered, and this will be used on a daily basis. This is necessary to ensure accurate and precise localization of the target in addition to accurate alignment of the normal tissue structures in this region.  Treatment planning then occurred.  I have requested : Intensity Modulated Radiotherapy (IMRT) is medically necessary for this case for the following reason:  Dose homogeneity and treatment of a head and neck site. The target is in close proximity to critical normal structures, as well as the parotid glands which may negatively impact the patient's quality of life if not maximally spared given the constraints of the overall treatment plan. IMRT is thus medically necessary to appropriately treat the patient.   Special treatment procedure The patient will receive chemotherapy during the course of radiation treatment. The patient may experience increased or  overlapping toxicity due to this combined-modality approach and the patient will be monitored for such problems. This may include extra lab work as necessary. This therefore constitutes a special treatment procedure.    ________________________________   Jodelle Gross, MD, PhD

## 2014-04-26 ENCOUNTER — Ambulatory Visit: Payer: BC Managed Care – PPO

## 2014-04-26 ENCOUNTER — Ambulatory Visit
Admission: RE | Admit: 2014-04-26 | Discharge: 2014-04-26 | Disposition: A | Discharge status: Home / Self Care | Payer: BC Managed Care – PPO | Source: Ambulatory Visit | Attending: Radiation Oncology | Admitting: Radiation Oncology

## 2014-04-26 DIAGNOSIS — Z510 Encounter for antineoplastic radiation therapy: Secondary | ICD-10-CM | POA: Diagnosis not present

## 2014-04-27 ENCOUNTER — Other Ambulatory Visit (HOSPITAL_COMMUNITY): Payer: Self-pay | Admitting: Cardiology

## 2014-04-27 ENCOUNTER — Encounter: Payer: Self-pay | Admitting: Radiation Oncology

## 2014-04-27 ENCOUNTER — Ambulatory Visit: Payer: BC Managed Care – PPO

## 2014-04-27 ENCOUNTER — Ambulatory Visit
Admission: RE | Admit: 2014-04-27 | Discharge: 2014-04-27 | Disposition: A | Discharge status: Home / Self Care | Payer: BC Managed Care – PPO | Source: Ambulatory Visit | Attending: Radiation Oncology | Admitting: Radiation Oncology

## 2014-04-27 VITALS — BP 140/99 | HR 84 | Temp 98.1°F | Resp 20 | Wt 199.8 lb

## 2014-04-27 DIAGNOSIS — C50511 Malignant neoplasm of lower-outer quadrant of right female breast: Secondary | ICD-10-CM

## 2014-04-27 DIAGNOSIS — C50919 Malignant neoplasm of unspecified site of unspecified female breast: Secondary | ICD-10-CM

## 2014-04-27 DIAGNOSIS — Z510 Encounter for antineoplastic radiation therapy: Secondary | ICD-10-CM | POA: Diagnosis not present

## 2014-04-27 NOTE — Progress Notes (Signed)
Department of Radiation Oncology  Phone:  [phone] Fax:        [phone]  Weekly Treatment Note    Name: Joan Valdez Date: 4/97/0263 MRN: [id number] DOB: Jul 24, 1971   Current dose: 27 Gy  Current fraction: 48.6   MEDICATIONS: Current Outpatient Prescriptions  Medication Sig Dispense Refill  . ALPRAZolam (XANAX) 1 MG tablet Take 1 tablet (1 mg total) by mouth at bedtime as needed for anxiety.  30 tablet  0  . dexlansoprazole (DEXILANT) 60 MG capsule Take 60 mg by mouth daily.      Marland Kitchen dihydroergotamine (MIGRANAL) 4 MG/ML nasal spray Place 1 spray into the nose as needed for migraine. Use in one nostril as directed.  No more than 4 sprays in one hour      . Eflornithine HCl (VANIQA) 13.9 % cream Apply 1 application topically daily.      Marland Kitchen EPIPEN 2-PAK 0.3 MG/0.3ML SOAJ injection       . escitalopram (LEXAPRO) 10 MG tablet Take 1 tablet (10 mg total) by mouth daily.  30 tablet  5  . Goserelin Acetate (ZOLADEX [place]) Inject into the skin every 30 (thirty) days. Next injection due on May 12      . hyaluronate sodium (RADIAPLEXRX) GEL Apply 1 application topically 2 (two) times daily. Apply to skin area after rad txs daily and bedtime and on wekends,2nd tube given 04/13/14      . levothyroxine (SYNTHROID, LEVOTHROID) 100 MCG tablet Take 100 mcg by mouth daily.       Marland Kitchen lidocaine-prilocaine (EMLA) cream Apply 1 application topically once as needed (if need for port).      Marland Kitchen liothyronine (CYTOMEL) 25 MCG tablet Take 12.5 mcg by mouth 2 (two) times daily.       Marland Kitchen LORazepam (ATIVAN) 0.5 MG tablet Take 1 tablet (0.5 mg total) by mouth every 6 (six) hours as needed for anxiety.  30 tablet  0  . meclizine (ANTIVERT) 25 MG tablet Take 1 tablet (25 mg total) by mouth 3 (three) times daily as needed for dizziness.  30 tablet  0  . methocarbamol (ROBAXIN) 500 MG tablet       . mometasone (NASONEX) 50 MCG/ACT nasal spray Place 2 sprays into both nostrils daily as needed (allergies).       .  non-metallic deodorant Jethro Poling) MISC Apply 1 application topically daily. Apply after rad txs daily and prn      . Nutritional Supplements (JUICE PLUS FIBRE PO) Take 3 tablets by mouth daily.       . ranitidine (ZANTAC) 300 MG tablet Take 300 mg by mouth at bedtime.      . topiramate (TOPAMAX) 100 MG tablet Take 200 mg by mouth at bedtime.       No current facility-administered medications for this encounter.     ALLERGIES: Imitrex; Amoxicillin; Bactrim; Cephalexin; Erythromycin; Penicillins; Strawberry; Sulfa antibiotics; Vancomycin; and Zithromax   LABORATORY DATA:  Lab Results  Component Value Date   WBC 5.8 03/13/2014   HGB 12.3 03/13/2014   HCT 37.8 03/13/2014   MCV 87.4 03/13/2014   PLT 173 03/13/2014   Lab Results  Component Value Date   NA 141 03/13/2014   K 3.5 03/13/2014   CL 106 01/23/2014   CO2 23 03/13/2014   Lab Results  Component Value Date   ALT 15 03/13/2014   AST 16 03/13/2014   ALKPHOS 80 03/13/2014   BILITOT 0.34 03/13/2014     NARRATIVE: Joan Valdez was seen  today for weekly treatment management. The chart was checked and the patient's films were reviewed. The patient states she continues to do fairly well. She has noticed continued irritation in the treatment area but it does not feel bad according to the patient.   PHYSICAL EXAMINATION: weight is 199 lb 12.8 oz (90.629 kg). Her oral temperature is 98.1 F (36.7 C). Her blood pressure is 140/99 and her pulse is 84. Her respiration is 20.      some desquamation is present in the upper axilla. Supraclavicular region looks good with some radiation change.  ASSESSMENT: The patient is doing satisfactorily with treatment.  PLAN: We will continue with the patient's radiation treatment as planned. The patient will continue her current skin care regimen. She will begin her boost treatment after one more fraction.

## 2014-04-27 NOTE — Progress Notes (Signed)
wekly rad txs 27/33 rt cwall, subclav  Area, bright erythema, under axilla peeling, no c/o pain there,"Its numb', suggested neosporin  To that area, using radiaplex gel bid, appetite good, slight fatigue 10:46 AM

## 2014-04-30 ENCOUNTER — Ambulatory Visit
Admission: RE | Admit: 2014-04-30 | Discharge: 2014-04-30 | Disposition: A | Discharge status: Home / Self Care | Payer: BC Managed Care – PPO | Source: Ambulatory Visit | Attending: Radiation Oncology | Admitting: Radiation Oncology

## 2014-04-30 ENCOUNTER — Ambulatory Visit: Payer: BC Managed Care – PPO

## 2014-04-30 DIAGNOSIS — Z510 Encounter for antineoplastic radiation therapy: Secondary | ICD-10-CM | POA: Diagnosis not present

## 2014-05-01 ENCOUNTER — Ambulatory Visit: Payer: BC Managed Care – PPO

## 2014-05-01 ENCOUNTER — Ambulatory Visit
Admission: RE | Admit: 2014-05-01 | Discharge: 2014-05-01 | Disposition: A | Discharge status: Home / Self Care | Payer: BC Managed Care – PPO | Source: Ambulatory Visit | Attending: Radiation Oncology | Admitting: Radiation Oncology

## 2014-05-01 DIAGNOSIS — Z510 Encounter for antineoplastic radiation therapy: Secondary | ICD-10-CM | POA: Diagnosis not present

## 2014-05-02 ENCOUNTER — Ambulatory Visit: Payer: BC Managed Care – PPO | Admitting: Radiation Oncology

## 2014-05-02 ENCOUNTER — Ambulatory Visit: Payer: BC Managed Care – PPO

## 2014-05-02 ENCOUNTER — Ambulatory Visit
Admission: RE | Admit: 2014-05-02 | Discharge: 2014-05-02 | Disposition: A | Discharge status: Home / Self Care | Payer: BC Managed Care – PPO | Source: Ambulatory Visit | Attending: Radiation Oncology | Admitting: Radiation Oncology

## 2014-05-02 DIAGNOSIS — Z510 Encounter for antineoplastic radiation therapy: Secondary | ICD-10-CM | POA: Diagnosis not present

## 2014-05-03 ENCOUNTER — Ambulatory Visit (HOSPITAL_COMMUNITY)
Admission: RE | Admit: 2014-05-03 | Discharge: 2014-05-03 | Disposition: A | Discharge status: Home / Self Care | Payer: BC Managed Care – PPO | Source: Ambulatory Visit | Attending: Family Medicine | Admitting: Family Medicine

## 2014-05-03 ENCOUNTER — Encounter (HOSPITAL_COMMUNITY): Payer: Self-pay

## 2014-05-03 ENCOUNTER — Ambulatory Visit
Admission: RE | Admit: 2014-05-03 | Discharge: 2014-05-03 | Disposition: A | Discharge status: Home / Self Care | Payer: BC Managed Care – PPO | Source: Ambulatory Visit | Attending: Radiation Oncology | Admitting: Radiation Oncology

## 2014-05-03 ENCOUNTER — Ambulatory Visit (HOSPITAL_BASED_OUTPATIENT_CLINIC_OR_DEPARTMENT_OTHER)
Admission: RE | Admit: 2014-05-03 | Discharge: 2014-05-03 | Disposition: A | Discharge status: Home / Self Care | Payer: BC Managed Care – PPO | Source: Ambulatory Visit | Attending: Internal Medicine | Admitting: Internal Medicine

## 2014-05-03 VITALS — BP 112/64 | HR 80 | Wt 200.5 lb

## 2014-05-03 DIAGNOSIS — C50511 Malignant neoplasm of lower-outer quadrant of right female breast: Secondary | ICD-10-CM

## 2014-05-03 DIAGNOSIS — R0609 Other forms of dyspnea: Secondary | ICD-10-CM | POA: Insufficient documentation

## 2014-05-03 DIAGNOSIS — C50919 Malignant neoplasm of unspecified site of unspecified female breast: Secondary | ICD-10-CM

## 2014-05-03 DIAGNOSIS — R0989 Other specified symptoms and signs involving the circulatory and respiratory systems: Principal | ICD-10-CM | POA: Insufficient documentation

## 2014-05-03 DIAGNOSIS — C50519 Malignant neoplasm of lower-outer quadrant of unspecified female breast: Secondary | ICD-10-CM

## 2014-05-03 DIAGNOSIS — Z510 Encounter for antineoplastic radiation therapy: Secondary | ICD-10-CM | POA: Diagnosis not present

## 2014-05-03 NOTE — Progress Notes (Signed)
Echo Lab  2D Echocardiogram completed.  Cale, RDCS 05/03/2014 2:26 PM

## 2014-05-03 NOTE — Patient Instructions (Signed)
Your physician recommends that you schedule a follow-up appointment in: 3 months with echocardiogram  

## 2014-05-04 ENCOUNTER — Ambulatory Visit: Payer: BC Managed Care – PPO | Admitting: Radiation Oncology

## 2014-05-04 ENCOUNTER — Ambulatory Visit
Admission: RE | Admit: 2014-05-04 | Discharge: 2014-05-04 | Disposition: A | Discharge status: Home / Self Care | Payer: BC Managed Care – PPO | Source: Ambulatory Visit | Attending: Radiation Oncology | Admitting: Radiation Oncology

## 2014-05-04 DIAGNOSIS — Z510 Encounter for antineoplastic radiation therapy: Secondary | ICD-10-CM | POA: Diagnosis not present

## 2014-05-04 NOTE — Progress Notes (Signed)
Patient ID: Joan Valdez, female   DOB: Aug 14, 1971, 43 y.o.   MRN: [id number] Oncologist: Dr. Humphrey Rolls  43 yo with R breast cancer diagnosed in 12/14,  ER+/PR+/HER-2/neu+.  Started neoadjuvant chemo (1/15) with Taxotere, carboplatin, Herceptin, Perjeta with plan for 6 cycles and total of 1 year Herceptin, to end 12/15.  She had mastectomy in 5/15.    Echo 3/15:  EF 75-88%, grade I diastolic dysfunction, lateral s' 16.1 cm/sec, global longitudinal strain -12.6% (?accuracy).   Echo 01/19/14: EF 65-70% lateral s' 15.7 GLS -13.7 (inaccurate) Echo 8/15: EF 60-65%, lateral s' 14.7, GLS 17.9%  Patient is doing well overall. No further palpitations. No SOB, CP or edema.   PMH: 1. GERD 2. Hypothyroidism 3. Breast cancer: On right, ER+/PR+/HER-2/neu+.  Diagnosed 12/14.  Started neoadjuvant chemo (1/15) with Taxotere, carboplatin, Herceptin, Perjeta with plan for 6 cycles and total of 1 year Herceptin. - Echo (3/15) with EF 32-54%, grade I diastolic dysfunction, lateral s' 16.1 cm/sec, global longitudinal strain -12.6% (?accuracy).  - Echo 01/19/14: EF 65-70% lateral s' 15.7 GLS -13.7 (inaccurate) - Echo 8/15: EF 60-65%, lateral s' 14.7, GLS 17.9%  SH: Nonsmoker, lives in [place], not working currently.   FH: Grandmother with pacemaker.    ROS: All systems reviewed and negative except as per HPI.   Current Outpatient Prescriptions  Medication Sig Dispense Refill  . ALPRAZolam (XANAX) 1 MG tablet Take 1 tablet (1 mg total) by mouth at bedtime as needed for anxiety.  30 tablet  0  . dexlansoprazole (DEXILANT) 60 MG capsule Take 60 mg by mouth daily.      Marland Kitchen dihydroergotamine (MIGRANAL) 4 MG/ML nasal spray Place 1 spray into the nose as needed for migraine. Use in one nostril as directed.  No more than 4 sprays in one hour      . Eflornithine HCl (VANIQA) 13.9 % cream Apply 1 application topically daily.      Marland Kitchen EPIPEN 2-PAK 0.3 MG/0.3ML SOAJ injection       . escitalopram (LEXAPRO) 10 MG tablet Take  1 tablet (10 mg total) by mouth daily.  30 tablet  5  . Goserelin Acetate (ZOLADEX [place]) Inject into the skin every 30 (thirty) days. Next injection due on May 12      . hyaluronate sodium (RADIAPLEXRX) GEL Apply 1 application topically 2 (two) times daily. Apply to skin area after rad txs daily and bedtime and on wekends,2nd tube given 04/13/14      . levothyroxine (SYNTHROID, LEVOTHROID) 100 MCG tablet Take 100 mcg by mouth daily.       Marland Kitchen lidocaine-prilocaine (EMLA) cream Apply 1 application topically once as needed (if need for port).      Marland Kitchen liothyronine (CYTOMEL) 25 MCG tablet Take 12.5 mcg by mouth 2 (two) times daily.       Marland Kitchen LORazepam (ATIVAN) 0.5 MG tablet Take 1 tablet (0.5 mg total) by mouth every 6 (six) hours as needed for anxiety.  30 tablet  0  . meclizine (ANTIVERT) 25 MG tablet Take 1 tablet (25 mg total) by mouth 3 (three) times daily as needed for dizziness.  30 tablet  0  . methocarbamol (ROBAXIN) 500 MG tablet       . mometasone (NASONEX) 50 MCG/ACT nasal spray Place 2 sprays into both nostrils daily as needed (allergies).       . non-metallic deodorant Jethro Poling) MISC Apply 1 application topically daily. Apply after rad txs daily and prn      . Nutritional Supplements (  JUICE PLUS FIBRE PO) Take 3 tablets by mouth daily.       . ranitidine (ZANTAC) 300 MG tablet Take 300 mg by mouth at bedtime.      . topiramate (TOPAMAX) 100 MG tablet Take 200 mg by mouth at bedtime.       No current facility-administered medications for this encounter.   BP 112/64  Pulse 80  Wt 200 lb 8 oz (90.946 kg)  SpO2 97% General: NAD Neck: No JVD, no thyromegaly or thyroid nodule.  Lungs: Clear to auscultation bilaterally with normal respiratory effort. CV: Nondisplaced PMI.  Heart regular S1/S2, no S3/S4, no murmur.  No peripheral edema.  No carotid bruit.  Normal pedal pulses.  Abdomen: Obese. Soft, nontender, no hepatosplenomegaly, no distention.  Skin: Intact without lesions or rashes.   Neurologic: Alert and oriented x 3.  Psych: Normal affect. Extremities: No clubbing or cyanosis.   Assessment/Plan: 1. Breast cancer: I reviewed today's echo. EF and doppler/strain parameters stable. No HF on exam. Continue Herceptin. Repeat echo and office visit in 3 months.  2. Palpitations: Resolved 3. Abnormal ECG: Somewhat nonspecific but not normal. Will need to follow LV function closely.   [name] MD 05/04/2014

## 2014-05-05 ENCOUNTER — Encounter: Payer: Self-pay | Admitting: Family Medicine

## 2014-05-05 ENCOUNTER — Encounter: Payer: Self-pay | Admitting: Oncology

## 2014-05-07 ENCOUNTER — Ambulatory Visit: Payer: BC Managed Care – PPO

## 2014-05-07 ENCOUNTER — Ambulatory Visit
Admission: RE | Admit: 2014-05-07 | Discharge: 2014-05-07 | Disposition: A | Discharge status: Home / Self Care | Payer: BC Managed Care – PPO | Source: Ambulatory Visit | Attending: Radiation Oncology | Admitting: Radiation Oncology

## 2014-05-07 ENCOUNTER — Telehealth: Payer: Self-pay | Admitting: Oncology

## 2014-05-07 ENCOUNTER — Encounter: Payer: Self-pay | Admitting: Radiation Oncology

## 2014-05-07 ENCOUNTER — Other Ambulatory Visit: Payer: Self-pay | Admitting: Oncology

## 2014-05-07 VITALS — Wt 203.0 lb

## 2014-05-07 DIAGNOSIS — C50511 Malignant neoplasm of lower-outer quadrant of right female breast: Secondary | ICD-10-CM

## 2014-05-07 DIAGNOSIS — Z510 Encounter for antineoplastic radiation therapy: Secondary | ICD-10-CM | POA: Diagnosis not present

## 2014-05-07 NOTE — Progress Notes (Signed)
Patient brought to nursing last day xrt right sub/breast chest wall , dry desquamation, peeling, using radiaplex,hydrocortisone tx, no vitals taken, Md in to see patient, 1 month f/u appt given 4:15 PM

## 2014-05-07 NOTE — Progress Notes (Signed)
Department of Radiation Oncology  Phone:  [phone] Fax:        [phone]  Weekly Treatment Note    Name: Joan Valdez Date: 6/83/4196 MRN: [id number] DOB: 1971-08-15   Current dose: 60.4 Gy  Current fraction: 33   MEDICATIONS: Current Outpatient Prescriptions  Medication Sig Dispense Refill  . ALPRAZolam (XANAX) 1 MG tablet Take 1 tablet (1 mg total) by mouth at bedtime as needed for anxiety.  30 tablet  0  . dexlansoprazole (DEXILANT) 60 MG capsule Take 60 mg by mouth daily.      Marland Kitchen dihydroergotamine (MIGRANAL) 4 MG/ML nasal spray Place 1 spray into the nose as needed for migraine. Use in one nostril as directed.  No more than 4 sprays in one hour      . Eflornithine HCl (VANIQA) 13.9 % cream Apply 1 application topically daily.      Marland Kitchen EPIPEN 2-PAK 0.3 MG/0.3ML SOAJ injection       . escitalopram (LEXAPRO) 10 MG tablet Take 1 tablet (10 mg total) by mouth daily.  30 tablet  5  . Goserelin Acetate (ZOLADEX [place]) Inject into the skin every 30 (thirty) days. Next injection due on May 12      . hyaluronate sodium (RADIAPLEXRX) GEL Apply 1 application topically 2 (two) times daily. Apply to skin area after rad txs daily and bedtime and on wekends,2nd tube given 04/13/14      . levothyroxine (SYNTHROID, LEVOTHROID) 100 MCG tablet Take 100 mcg by mouth daily.       Marland Kitchen lidocaine-prilocaine (EMLA) cream Apply 1 application topically once as needed (if need for port).      Marland Kitchen liothyronine (CYTOMEL) 25 MCG tablet Take 12.5 mcg by mouth 2 (two) times daily.       Marland Kitchen LORazepam (ATIVAN) 0.5 MG tablet Take 1 tablet (0.5 mg total) by mouth every 6 (six) hours as needed for anxiety.  30 tablet  0  . meclizine (ANTIVERT) 25 MG tablet Take 1 tablet (25 mg total) by mouth 3 (three) times daily as needed for dizziness.  30 tablet  0  . methocarbamol (ROBAXIN) 500 MG tablet       . mometasone (NASONEX) 50 MCG/ACT nasal spray Place 2 sprays into both nostrils daily as needed (allergies).       .  non-metallic deodorant Jethro Poling) MISC Apply 1 application topically daily. Apply after rad txs daily and prn      . Nutritional Supplements (JUICE PLUS FIBRE PO) Take 3 tablets by mouth daily.       . ranitidine (ZANTAC) 300 MG tablet Take 300 mg by mouth at bedtime.      . topiramate (TOPAMAX) 100 MG tablet Take 200 mg by mouth at bedtime.       No current facility-administered medications for this encounter.     ALLERGIES: Imitrex; Amoxicillin; Bactrim; Cephalexin; Erythromycin; Penicillins; Strawberry; Sulfa antibiotics; Vancomycin; and Zithromax   LABORATORY DATA:  Lab Results  Component Value Date   WBC 5.8 03/13/2014   HGB 12.3 03/13/2014   HCT 37.8 03/13/2014   MCV 87.4 03/13/2014   PLT 173 03/13/2014   Lab Results  Component Value Date   NA 141 03/13/2014   K 3.5 03/13/2014   CL 106 01/23/2014   CO2 23 03/13/2014   Lab Results  Component Value Date   ALT 15 03/13/2014   AST 16 03/13/2014   ALKPHOS 80 03/13/2014   BILITOT 0.34 03/13/2014     NARRATIVE: Joan Valdez was seen  today for weekly treatment management. The chart was checked and the patient's films were reviewed. The patient completed her final fraction of radiation treatment today. She describes some ongoing skin irritation no major change. Daily. Please that she has finished.  PHYSICAL EXAMINATION: weight is 203 lb (92.08 kg).      diffuse radiation change with erythema and some dry desquamation. No significant moist desquamation on exam today.  ASSESSMENT: The patient is doing satisfactorily with treatment.  PLAN: We will continue with the patient's radiation treatment as planned. Followup in one month.

## 2014-05-07 NOTE — Telephone Encounter (Signed)
Routed to provider and collaborative nurse.

## 2014-05-07 NOTE — Telephone Encounter (Signed)
per registration pt stated did not need this inj today it was sch wrong-per Brayton Layman to CX pt inj for 8/24

## 2014-05-08 MED ORDER — TRAZODONE HCL 50 MG PO TABS: 50.0000 mg | ORAL_TABLET | Freq: Every evening | ORAL | Status: DC | PRN

## 2014-05-08 NOTE — Telephone Encounter (Signed)
MyChart notes have been sent to patient from MD last evening.

## 2014-05-08 NOTE — Telephone Encounter (Signed)
Forwarded to Val.

## 2014-05-09 ENCOUNTER — Encounter: Payer: Self-pay | Admitting: General Practice

## 2014-05-09 NOTE — Progress Notes (Signed)
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  She reports good support and a very positive attitude, using gratitude, teaching, and community care to cope.  Served as witness to her story, providing encouragement and affirmation.  [place], [place]

## 2014-05-15 ENCOUNTER — Ambulatory Visit (HOSPITAL_BASED_OUTPATIENT_CLINIC_OR_DEPARTMENT_OTHER): Payer: BC Managed Care – PPO

## 2014-05-15 ENCOUNTER — Ambulatory Visit
Admission: RE | Admit: 2014-05-15 | Discharge: 2014-05-15 | Disposition: A | Discharge status: Home / Self Care | Payer: BC Managed Care – PPO | Source: Ambulatory Visit | Attending: Radiation Oncology | Admitting: Radiation Oncology

## 2014-05-15 ENCOUNTER — Other Ambulatory Visit: Payer: Self-pay | Admitting: Oncology

## 2014-05-15 VITALS — BP 111/70 | HR 122 | Temp 98.6°F

## 2014-05-15 DIAGNOSIS — C50511 Malignant neoplasm of lower-outer quadrant of right female breast: Secondary | ICD-10-CM

## 2014-05-15 DIAGNOSIS — Z5111 Encounter for antineoplastic chemotherapy: Secondary | ICD-10-CM

## 2014-05-15 DIAGNOSIS — C773 Secondary and unspecified malignant neoplasm of axilla and upper limb lymph nodes: Secondary | ICD-10-CM

## 2014-05-15 DIAGNOSIS — Z5112 Encounter for antineoplastic immunotherapy: Secondary | ICD-10-CM

## 2014-05-15 DIAGNOSIS — C50519 Malignant neoplasm of lower-outer quadrant of unspecified female breast: Secondary | ICD-10-CM

## 2014-05-15 DIAGNOSIS — Z452 Encounter for adjustment and management of vascular access device: Secondary | ICD-10-CM

## 2014-05-15 MED ORDER — ALTEPLASE 2 MG IJ SOLR
2.0000 mg | Freq: Once | INTRAMUSCULAR | Status: AC | PRN
  Administered 2014-05-15: 2 mg
  Filled 2014-05-15: qty 2

## 2014-05-15 MED ORDER — DIPHENHYDRAMINE HCL 25 MG PO CAPS
ORAL_CAPSULE | ORAL | Status: AC
  Filled 2014-05-15: qty 1

## 2014-05-15 MED ORDER — ACETAMINOPHEN 325 MG PO TABS
650.0000 mg | ORAL_TABLET | Freq: Once | ORAL | Status: AC
  Administered 2014-05-15: 650 mg via ORAL

## 2014-05-15 MED ORDER — SODIUM CHLORIDE 0.9 % IJ SOLN
10.0000 mL | INTRAMUSCULAR | Status: DC | PRN
  Administered 2014-05-15: 10 mL
  Filled 2014-05-15: qty 10

## 2014-05-15 MED ORDER — HEPARIN SOD (PORK) LOCK FLUSH 100 UNIT/ML IV SOLN
500.0000 [IU] | Freq: Once | INTRAVENOUS | Status: AC | PRN
  Administered 2014-05-15: 500 [IU]
  Filled 2014-05-15: qty 5

## 2014-05-15 MED ORDER — TRASTUZUMAB CHEMO INJECTION 440 MG
6.0000 mg/kg | Freq: Once | INTRAVENOUS | Status: AC
  Administered 2014-05-15: 546 mg via INTRAVENOUS
  Filled 2014-05-15: qty 26

## 2014-05-15 MED ORDER — GOSERELIN ACETATE 3.6 MG ~~LOC~~ IMPL
3.6000 mg | DRUG_IMPLANT | Freq: Once | SUBCUTANEOUS | Status: AC
  Administered 2014-05-15: 3.6 mg via SUBCUTANEOUS
  Filled 2014-05-15: qty 3.6

## 2014-05-15 MED ORDER — DIPHENHYDRAMINE HCL 25 MG PO CAPS
25.0000 mg | ORAL_CAPSULE | Freq: Once | ORAL | Status: AC
  Administered 2014-05-15: 25 mg via ORAL

## 2014-05-15 MED ORDER — ACETAMINOPHEN 325 MG PO TABS
ORAL_TABLET | ORAL | Status: AC
  Filled 2014-05-15: qty 2

## 2014-05-15 MED ORDER — SODIUM CHLORIDE 0.9 % IV SOLN: Freq: Once | INTRAVENOUS | Status: DC

## 2014-05-15 NOTE — Patient Instructions (Signed)
[facility] Discharge Instructions for Patients Receiving Chemotherapy  Today you received the following chemotherapy agents: Herceptin  To help prevent nausea and vomiting after your treatment, we encourage you to take your nausea medication: Lorazepam 0.5 mg every 6 hours as needed.   If you develop nausea and vomiting that is not controlled by your nausea medication, call the clinic.   BELOW ARE SYMPTOMS THAT SHOULD BE REPORTED IMMEDIATELY:  *FEVER GREATER THAN 100.5 F  *CHILLS WITH OR WITHOUT FEVER  NAUSEA AND VOMITING THAT IS NOT CONTROLLED WITH YOUR NAUSEA MEDICATION  *UNUSUAL SHORTNESS OF BREATH  *UNUSUAL BRUISING OR BLEEDING  TENDERNESS IN MOUTH AND THROAT WITH OR WITHOUT PRESENCE OF ULCERS  *URINARY PROBLEMS  *BOWEL PROBLEMS  UNUSUAL RASH Items with * indicate a potential emergency and should be followed up as soon as possible.  Feel free to call the clinic you have any questions or concerns. The clinic phone number is [phone].

## 2014-05-15 NOTE — Progress Notes (Signed)
1550:  Unable to obtain blood return with PAC after initial flush and then re-positioning.  Administered TPA @ approx. [place]     7290: Patient c/o serous fluid oozing from R breast surgical/radiation site. And also now with bloody drainage.  Pt. Applying abd typr gauze for absorption as the fluid is copious enough that it soaks through layers of clothing.   Pt. Has been using radio-plex cream per XRT  Orders.  Examined area.. It is raw and oozing brownish fluid with a bit of an odor to it.    Call made to [place], Malachy Mood.Marland Kitchen She will come to examine while pt is here.  Rad/Onc nurse here to see pt.  She escorted pt. To Rad/Onc for on call MD to examine.

## 2014-05-15 NOTE — Progress Notes (Signed)
Joan Valdez stopped by today. She finished last week and was here for Herceptin. She has had some moist desquamation over her left chest wall which has been oozing and is painful. She denies fever, odor or chills. She has a dressing on this today and asked to be seen. She has moist desquamation with some good skin islands present on the edges of the wound. There is some bleeding laterally. She is allergic to sulfa. I dressed the wound with gentian violet and instructed her to keep it as dry as possible. It should look better by Thursday. If not, she can come back around and we can re-apply.

## 2014-05-15 NOTE — Progress Notes (Signed)
Pt. Returned from Rad/Onc after seeing MD for right breast wound care.  Pt. Expressed understanding of dressing change and supplies.  PAC now has adequate blood return. Herceptin released for pharmacy to prepare.  No problem with infusion. Good blood return after treatment.  Flushed and deaccessed per protocol.

## 2014-05-16 ENCOUNTER — Ambulatory Visit (INDEPENDENT_AMBULATORY_CARE_PROVIDER_SITE_OTHER): Payer: BC Managed Care – PPO | Admitting: *Deleted

## 2014-05-16 DIAGNOSIS — Z111 Encounter for screening for respiratory tuberculosis: Secondary | ICD-10-CM

## 2014-05-16 NOTE — Progress Notes (Signed)
  Radiation Oncology         [phone] ________________________________  Name: Joan Valdez MRN: [id number]  Date: 04/25/2014  DOB: 1971-05-03  Complex simulation note  The patient has undergone complex simulation for her upcoming boost treatment for her diagnosis of breast cancer. The patient has initially been planned to receive 50.4 Gy. The patient will now receive a 10 Gy boost to the mastectomy scar which has been contoured. This will be accomplished using an en face electron field. Based on the depth of the target area, 6 MeV electrons will be used and this field has been normalized to the 90 % isodose line. The patient's final total dose therefore will be 60.4 Gy. A special port plan is requested for the boost treatment.   _______________________________  Jodelle Gross, MD, PhD

## 2014-05-16 NOTE — Progress Notes (Signed)
Patient seen in office for TB Skin test.   Tolerated administration well.

## 2014-05-16 NOTE — Progress Notes (Signed)
  Radiation Oncology         (336) [phone] ________________________________  Name: Joan Valdez MRN: [id number]  Date: 05/07/2014  DOB: [birth year]  End of Treatment Note  Diagnosis:   Right-sided breast cancer     Indication for treatment:  Curative       Radiation treatment dates:   03/22/2014 through 05/07/2014  Site/dose:   The patient was treated to the right chest wall and right supraclavicular region initially to a dose of 50.4 gray. This was completed using a 4 field technique in addition to a forward planning technique to the right chest wall. The patient then received a boost to the mastectomy scar using an en face electron field. The patient's total dose was 60.4 gray.  Narrative: The patient tolerated radiation treatment relatively well.   The patient had moderate skin change/irritation as she completed her treatment.  Plan: The patient has completed radiation treatment. The patient will return to radiation oncology clinic for routine followup in one month. I advised the patient to call or return sooner if they have any questions or concerns related to their recovery or treatment. ________________________________  Jodelle Gross, M.D., Ph.D.

## 2014-05-17 ENCOUNTER — Telehealth: Payer: Self-pay | Admitting: *Deleted

## 2014-05-17 NOTE — Telephone Encounter (Signed)
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

## 2014-05-18 ENCOUNTER — Ambulatory Visit: Payer: BC Managed Care – PPO | Admitting: Family Medicine

## 2014-05-18 ENCOUNTER — Encounter: Payer: Self-pay | Admitting: Oncology

## 2014-05-18 ENCOUNTER — Encounter: Payer: Self-pay | Admitting: Radiation Oncology

## 2014-05-18 ENCOUNTER — Ambulatory Visit
Admission: RE | Admit: 2014-05-18 | Discharge: 2014-05-18 | Disposition: A | Discharge status: Home / Self Care | Payer: BC Managed Care – PPO | Source: Ambulatory Visit | Attending: Radiation Oncology | Admitting: Radiation Oncology

## 2014-05-18 VITALS — BP 128/92 | HR 112 | Temp 98.3°F | Resp 20 | Ht 62.0 in | Wt 203.3 lb

## 2014-05-18 DIAGNOSIS — C50511 Malignant neoplasm of lower-outer quadrant of right female breast: Secondary | ICD-10-CM

## 2014-05-18 DIAGNOSIS — Z111 Encounter for screening for respiratory tuberculosis: Secondary | ICD-10-CM

## 2014-05-18 LAB — TB SKIN TEST: TB Skin Test: NEGATIVE

## 2014-05-18 NOTE — Progress Notes (Signed)
The patient came in for another skin check today. By report, scan is much improved as compared to earlier this week. Still significant moist desquamation around the boost site/mastectomy scar. She is here for another application of gentian violet which has been applied to the site.  The patient can be seen again next week if necessary. She is moving in a couple of weeks out of [place]. I would be happy to see her next Thursday if she feels that this is necessary. I anticipate that her skin will significantly improve over the next week.

## 2014-05-18 NOTE — Progress Notes (Signed)
Follow up skin check right chest wall, moist desquamation still but is drying up, yellow and bleeding on dressing, here to get another gentian violet applied to site

## 2014-05-22 ENCOUNTER — Telehealth: Payer: Self-pay | Admitting: *Deleted

## 2014-05-22 ENCOUNTER — Encounter: Payer: Self-pay | Admitting: *Deleted

## 2014-05-22 NOTE — Telephone Encounter (Signed)
Called and e-mailed patient she can come in this Thursday afternoon after work  To be seen if necessary per Dr.moody or next visit the 18th after her last day of work, will fit her in, she is moving to [place] in a couple weeks, waiting for call back of status 3:21 PM

## 2014-05-22 NOTE — Telephone Encounter (Signed)
Printed to notify Dr. Jana Hakim patient move date is 06-01-2014, This Triage nurse cancelled 05-28-2014 appointments and advice for 06-05-2014 lab test and lab appointment request.

## 2014-05-24 ENCOUNTER — Telehealth: Payer: Self-pay | Admitting: Oncology

## 2014-05-24 ENCOUNTER — Encounter: Payer: Self-pay | Admitting: Radiation Oncology

## 2014-05-24 ENCOUNTER — Other Ambulatory Visit: Payer: Self-pay | Admitting: Oncology

## 2014-05-24 ENCOUNTER — Ambulatory Visit
Admission: RE | Admit: 2014-05-24 | Discharge: 2014-05-24 | Disposition: A | Discharge status: Home / Self Care | Payer: BC Managed Care – PPO | Source: Ambulatory Visit | Attending: Radiation Oncology | Admitting: Radiation Oncology

## 2014-05-24 VITALS — BP 105/84 | HR 104 | Temp 98.5°F | Resp 20

## 2014-05-24 DIAGNOSIS — Z9221 Personal history of antineoplastic chemotherapy: Secondary | ICD-10-CM | POA: Insufficient documentation

## 2014-05-24 DIAGNOSIS — C50919 Malignant neoplasm of unspecified site of unspecified female breast: Secondary | ICD-10-CM | POA: Insufficient documentation

## 2014-05-24 DIAGNOSIS — Z510 Encounter for antineoplastic radiation therapy: Secondary | ICD-10-CM | POA: Insufficient documentation

## 2014-05-24 DIAGNOSIS — C50511 Malignant neoplasm of lower-outer quadrant of right female breast: Secondary | ICD-10-CM

## 2014-05-24 NOTE — Progress Notes (Signed)
Follow up skin check s/p rt cw rad 03/22/14-05/07/14, have applied gentian violet  2 x, has moist desquamation in that area,yellow dranage, telfa dresing wigh drainage on it, ,she is allergic to sulfa antibiotics causes hives really;y bad"almost felt like scratching my skin off"moving  Soon to [place]. 3:48 PM

## 2014-05-24 NOTE — Progress Notes (Signed)
Radiation Oncology         ([phone]) [phone] ________________________________  Name: Joan Valdez MRN: [id number]  Date: 05/24/2014  DOB: 21-Jun-1971  Follow-Up Visit Note  CC: [name], MD  Jovita Kussmaul, MD  Diagnosis:   Right-sided breast cancer   Narrative:  The patient returns today for a skin check today. This she completed radiation on 05/07/2014 but still has significant moist desquamation in the boost region. This really developed towards the end and after her treatment. During treatment overall her skin held up quite well. No improvement since she was seen last week.                    ALLERGIES:  is allergic to imitrex; amoxicillin; bactrim; cephalexin; erythromycin; penicillins; strawberry; sulfa antibiotics; vancomycin; and zithromax.  Meds: Current Outpatient Prescriptions  Medication Sig Dispense Refill  . ALPRAZolam (XANAX) 1 MG tablet Take 1 tablet (1 mg total) by mouth at bedtime as needed for anxiety.  30 tablet  0  . dexlansoprazole (DEXILANT) 60 MG capsule Take 60 mg by mouth daily.      Marland Kitchen dihydroergotamine (MIGRANAL) 4 MG/ML nasal spray Place 1 spray into the nose as needed for migraine. Use in one nostril as directed.  No more than 4 sprays in one hour      . Eflornithine HCl (VANIQA) 13.9 % cream Apply 1 application topically daily.      Marland Kitchen EPIPEN 2-PAK 0.3 MG/0.3ML SOAJ injection       . escitalopram (LEXAPRO) 10 MG tablet Take 1 tablet (10 mg total) by mouth daily.  30 tablet  5  . Goserelin Acetate (ZOLADEX [place]) Inject into the skin every 30 (thirty) days. Next injection due on May 12      . hyaluronate sodium (RADIAPLEXRX) GEL Apply 1 application topically 2 (two) times daily. Apply to skin area after rad txs daily and bedtime and on wekends,2nd tube given 04/13/14      . levothyroxine (SYNTHROID, LEVOTHROID) 100 MCG tablet Take 100 mcg by mouth daily.       Marland Kitchen lidocaine-prilocaine (EMLA) cream Apply 1 application topically once as needed (if need for  port).      Marland Kitchen liothyronine (CYTOMEL) 25 MCG tablet Take 12.5 mcg by mouth 2 (two) times daily.       Marland Kitchen LORazepam (ATIVAN) 0.5 MG tablet Take 1 tablet (0.5 mg total) by mouth every 6 (six) hours as needed for anxiety.  30 tablet  0  . mometasone (NASONEX) 50 MCG/ACT nasal spray Place 2 sprays into both nostrils daily as needed (allergies).       . Nutritional Supplements (JUICE PLUS FIBRE PO) Take 3 tablets by mouth daily.       . ranitidine (ZANTAC) 300 MG tablet Take 300 mg by mouth at bedtime.      . topiramate (TOPAMAX) 100 MG tablet Take 200 mg by mouth at bedtime.      . traZODone (DESYREL) 50 MG tablet Take 1 tablet (50 mg total) by mouth at bedtime as needed for sleep.  30 tablet  3  . meclizine (ANTIVERT) 25 MG tablet Take 1 tablet (25 mg total) by mouth 3 (three) times daily as needed for dizziness.  30 tablet  0  . methocarbamol (ROBAXIN) 500 MG tablet       . non-metallic deodorant (ALRA) MISC Apply 1 application topically daily. Apply after rad txs daily and prn       No current facility-administered medications for this  encounter.    Physical Findings: The patient is in no acute distress. Patient is alert and oriented.  oral temperature is 98.5 F (36.9 C). Her blood pressure is 105/84 and her pulse is 104. Her respiration is 20. .   Significant moist desquamation remains in the boost site surrounding the surgical incision.  Lab Findings: Lab Results  Component Value Date   WBC 5.8 03/13/2014   HGB 12.3 03/13/2014   HCT 37.8 03/13/2014   MCV 87.4 03/13/2014   PLT 173 03/13/2014     Radiographic Findings: No results found.  Impression:    The skin has not yet begun to significantly heal. The patient had an increased response versus what is typically seen. I do believe this area is going to heal substantially over the next several weeks. She is potentially allergic to Silvadene so we will hold off on this at this time.  Plan:  The patient will begin using Domeboro soaks.  Hopefully this will be soothing and dry this area out in the area of moist desquamation. The patient is leaving out of town within the next couple of weeks. She knows to contact our office next week to let us know how she is feeling I am happy to see her again next week if needed.   Jodelle Gross, M.D., Ph.D.

## 2014-05-24 NOTE — Telephone Encounter (Signed)
cld & left pt message about appt time @2 :30 9/22

## 2014-05-25 ENCOUNTER — Other Ambulatory Visit: Payer: Self-pay | Admitting: Family Medicine

## 2014-05-28 ENCOUNTER — Ambulatory Visit: Payer: BC Managed Care – PPO | Admitting: Oncology

## 2014-05-28 ENCOUNTER — Ambulatory Visit: Payer: BC Managed Care – PPO

## 2014-06-05 ENCOUNTER — Other Ambulatory Visit: Payer: Self-pay | Admitting: *Deleted

## 2014-06-05 ENCOUNTER — Ambulatory Visit (HOSPITAL_BASED_OUTPATIENT_CLINIC_OR_DEPARTMENT_OTHER): Payer: BC Managed Care – PPO | Admitting: Oncology

## 2014-06-05 ENCOUNTER — Other Ambulatory Visit (HOSPITAL_BASED_OUTPATIENT_CLINIC_OR_DEPARTMENT_OTHER): Payer: BC Managed Care – PPO

## 2014-06-05 ENCOUNTER — Other Ambulatory Visit: Payer: Self-pay | Admitting: Oncology

## 2014-06-05 ENCOUNTER — Ambulatory Visit (HOSPITAL_BASED_OUTPATIENT_CLINIC_OR_DEPARTMENT_OTHER): Payer: BC Managed Care – PPO

## 2014-06-05 VITALS — BP 106/73 | HR 88 | Temp 98.2°F | Resp 20

## 2014-06-05 VITALS — BP 106/73 | HR 88 | Temp 98.2°F | Resp 18 | Ht 62.0 in | Wt 124.6 lb

## 2014-06-05 DIAGNOSIS — C50511 Malignant neoplasm of lower-outer quadrant of right female breast: Secondary | ICD-10-CM

## 2014-06-05 DIAGNOSIS — C50919 Malignant neoplasm of unspecified site of unspecified female breast: Secondary | ICD-10-CM

## 2014-06-05 DIAGNOSIS — Z170 Estrogen receptor positive status [ER+]: Secondary | ICD-10-CM

## 2014-06-05 DIAGNOSIS — C773 Secondary and unspecified malignant neoplasm of axilla and upper limb lymph nodes: Secondary | ICD-10-CM

## 2014-06-05 DIAGNOSIS — Z5112 Encounter for antineoplastic immunotherapy: Secondary | ICD-10-CM

## 2014-06-05 LAB — COMPREHENSIVE METABOLIC PANEL (CC13)
ALT: 12 U/L (ref 0–55)
AST: 17 U/L (ref 5–34)
Albumin: 3.2 g/dL — ABNORMAL LOW (ref 3.5–5.0)
Alkaline Phosphatase: 105 U/L (ref 40–150)
Anion Gap: 6 meq/L (ref 3–11)
BUN: 15.3 mg/dL (ref 7.0–26.0)
CO2: 28 meq/L (ref 22–29)
Calcium: 9 mg/dL (ref 8.4–10.4)
Chloride: 111 meq/L — ABNORMAL HIGH (ref 98–109)
Creatinine: 0.8 mg/dL (ref 0.6–1.1)
Glucose: 99 mg/dL (ref 70–140)
Potassium: 3.7 meq/L (ref 3.5–5.1)
Sodium: 144 meq/L (ref 136–145)
Total Bilirubin: 0.33 mg/dL (ref 0.20–1.20)
Total Protein: 7 g/dL (ref 6.4–8.3)

## 2014-06-05 LAB — CBC WITH DIFFERENTIAL/PLATELET
BASO%: 0.6 % (ref 0.0–2.0)
Basophils Absolute: 0 10*3/uL (ref 0.0–0.1)
EOS%: 1.8 % (ref 0.0–7.0)
Eosinophils Absolute: 0.1 10*3/uL (ref 0.0–0.5)
HCT: 38.2 % (ref 34.8–46.6)
HGB: 12.6 g/dL (ref 11.6–15.9)
LYMPH%: 19.5 % (ref 14.0–49.7)
MCH: 27.6 pg (ref 25.1–34.0)
MCHC: 32.9 g/dL (ref 31.5–36.0)
MCV: 83.7 fL (ref 79.5–101.0)
MONO#: 0.4 10*3/uL (ref 0.1–0.9)
MONO%: 6.9 % (ref 0.0–14.0)
NEUT#: 4.5 10*3/uL (ref 1.5–6.5)
NEUT%: 71.2 % (ref 38.4–76.8)
Platelets: 173 10*3/uL (ref 145–400)
RBC: 4.56 10*6/uL (ref 3.70–5.45)
RDW: 16.7 % — ABNORMAL HIGH (ref 11.2–14.5)
WBC: 6.4 10*3/uL (ref 3.9–10.3)
lymph#: 1.2 10*3/uL (ref 0.9–3.3)

## 2014-06-05 MED ORDER — SODIUM CHLORIDE 0.9 % IJ SOLN
10.0000 mL | INTRAMUSCULAR | Status: DC | PRN
  Administered 2014-06-05: 10 mL
  Filled 2014-06-05: qty 10

## 2014-06-05 MED ORDER — DIPHENHYDRAMINE HCL 25 MG PO CAPS
25.0000 mg | ORAL_CAPSULE | Freq: Once | ORAL | Status: AC
Start: 2014-06-05 — End: 2014-06-05
  Administered 2014-06-05: 25 mg via ORAL

## 2014-06-05 MED ORDER — ACETAMINOPHEN 325 MG PO TABS
650.0000 mg | ORAL_TABLET | Freq: Once | ORAL | Status: AC
  Administered 2014-06-05: 650 mg via ORAL

## 2014-06-05 MED ORDER — DIPHENHYDRAMINE HCL 25 MG PO CAPS
ORAL_CAPSULE | ORAL | Status: AC
  Filled 2014-06-05: qty 1

## 2014-06-05 MED ORDER — SODIUM CHLORIDE 0.9 % IV SOLN
Freq: Once | INTRAVENOUS | Status: AC
  Administered 2014-06-05: 15:00:00 via INTRAVENOUS

## 2014-06-05 MED ORDER — HEPARIN SOD (PORK) LOCK FLUSH 100 UNIT/ML IV SOLN
500.0000 [IU] | Freq: Once | INTRAVENOUS | Status: AC | PRN
  Administered 2014-06-05: 500 [IU]
  Filled 2014-06-05: qty 5

## 2014-06-05 MED ORDER — TRASTUZUMAB CHEMO INJECTION 440 MG
6.0000 mg/kg | Freq: Once | INTRAVENOUS | Status: AC
  Administered 2014-06-05: 546 mg via INTRAVENOUS
  Filled 2014-06-05: qty 26

## 2014-06-05 MED ORDER — ACETAMINOPHEN 325 MG PO TABS
ORAL_TABLET | ORAL | Status: AC
  Filled 2014-06-05: qty 2

## 2014-06-05 NOTE — Progress Notes (Signed)
[place]  [phone] [phone]     ID: Joan Valdez DOB: [id number]  MR#: [id number]  EHM#:[id number]  Patient Care Team: Susy Frizzle, MD as PCP - General (Family Medicine)  CHIEF COMPLAINT: Stage III triple positive breast cancer  CURRENT TREATMENT: Trastuzumab, goserelin, anastrozole   BREAST CANCER HISTORY: From Dr. Bernell List Khan's intake note [id number]:  "Joan Valdez is a 43 y.o. female. Would medical history significant for hypothyroidism gastroesophageal reflux disease and migraine headaches.patient had a routine screening mammogram performed she was noted to have a large mass in the 6:00 position of the right breast measuring 3 cm for the primary mass with another 2 cm of calcifications anteriorly. She was also noted to have a enlarged lymph node in the right axilla. Patient underwent a needle core biopsy of the primary mass as well as axilla. The tumor was ER positive PR positive HER-2 positive with a proliferation marker Ki-67 59%. Patient had MRI of the breasts performed MRI showed right mass to measure 8.2 x 5.6 x 3.1 cm lymph node measured 2.5 cm on the left breast there was a 9 millimeter area of concern. Patient has been seen by Dr. Autumn Messing who has recommended neoadjuvant treatment."  The patient's subsequent history is as detailed below.   INTERVAL HISTORY: Joan Valdez returns today for followup of her breast cancer. Since her last visit here she completed her radiation treatments. She generally did well with that, with the usual fatigue and no more skin changes than expected. She continues on trastuzumab every 3 weeks and she received a dose today. She is also on goserelin every 4 weeks, and she received that on 05/15/2014.  REVIEW OF SYSTEMS: Joan Valdez is doing remarkably well considering all the changes she has undergone--cancer diagnosis, early menopause, and now a sudden move to the [place] area. In fact from this  appointment she is going home, picking up her CAT, and driving straight on to their temporary apartment in [place]. She was somewhat moody and irritable but was started on Lexapro by Dr. Dennard Schaumann and her mood has significantly improved. She is also sleeping a little better. She has no side effects that she is aware of from the Herceptin. She is trying to walk on a regular basis. A detailed review of systems today was otherwise noncontributory.  PAST MEDICAL HISTORY: Past Medical History  Diagnosis Date  . PCO (polycystic ovaries)   . Allergic rhinitis     takes Zyrtec daily and nasal spray as needed  . Cervical dysplasia   . Cancer     ;Breast cancer; pre-cancerous cervical cells  . 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 Insomnia     takes Ambien nightly  . Hypothyroid     takes Synthroid daily  . Acid reflux     takes Zantac nightly and Dexilant daily  . Family history of anesthesia complication     "mom has PONV  . Chronic headache     takes topamax nightly;last migraine in Dec 2014  . Kidney stones 12-10-12    "passed on their own"    PAST SURGICAL HISTORY: Past Surgical History  Procedure Laterality Date  . Cesarean section  12-11-99  . Colposcopy    . Cervical biopsy  w/ loop electrode excision  12-10-00  . Portacath placement N/A 09/04/2013    Procedure: INSERTION PORT-A-CATH;  Surgeon: Merrie Roof, MD;  Location: [facility];  Service: General;  Laterality: N/A;  .  Latissimus flap to breast Left 02/01/2014  . Mastectomy complete / simple Left 02/01/2014  . Mastectomy modified radical w/ axillary lymph nodes w/ or w/o pectoralis minor Right 02/01/2014  . Eye surgery Right 1970's?    Hx: of metal fragment removed  . Breast biopsy Bilateral 08/2013-09/2013  . Portacath placement Left 08/2013  . Total mastectomy Left 02/01/2014    Procedure: LEFT  SIMPLE MASTECTOMY;  Surgeon: Merrie Roof, MD;  Location: [facility];  Service: General;  Laterality: Left;  Marland Kitchen Mastectomy w/  sentinel node biopsy Right 02/01/2014    Procedure: RIGHT MODIFIED MASTECTOMY WITH AXILLARY NODE DISSECTION;  Surgeon: Merrie Roof, MD;  Location: [facility];  Service: General;  Laterality: Right;  . Latissimus flap to breast Left 02/01/2014    Procedure: LEFT LATISSIMUS FLAP TO BREAST WITH Silicone gel implant for immediate reconstruction.;  Surgeon: Crissie Reese, MD;  Location: [facility];  Service: Plastics;  Laterality: Left;    FAMILY HISTORY Family History  Problem Relation Age of Onset  . Hypertension Mother   . Migraines Mother   . Diabetes Paternal Grandmother   . Hypertension Paternal Grandmother   . Rheum arthritis Paternal Grandmother   . Heart disease Maternal Grandmother   . Aortic aneurysm Paternal Grandfather   . Psychiatric Illness Cousin   . Epilepsy Other 25    niece   Joan Valdez's parents are still alive, her father is 64 years old and her mother also a 48. She has one brother no sisters. There is no history of breast or ovarian cancer in the family to her knowledge  GYNECOLOGIC HISTORY:  No LMP recorded. Patient is not currently having periods (Reason: Other). Menarche age 59, first live birth age 12, she is [place] P1. She has been on monthly goserelin since December 2014. She tells me her periods are "horrible" and she is willing to go on with goserelin indefinitely or have her ovaries removed just so as not to have to face that problem again, she tells me.  SOCIAL HISTORY:  She works as a third Land. Her husband Nicki Reaper works for a [employer] in Social research officer, government. Their daughter's name is Lilia Pro. She is currently 20. The patient at tends [place].    ADVANCED DIRECTIVES: In place   HEALTH MAINTENANCE: History  Substance Use Topics  . Smoking status: Never Smoker   . Smokeless tobacco: Never Used  . Alcohol Use: Yes     Comment: 02/01/2014 "none in the last 6 months; usually have a couple glasses of wine twice/wk"      Colonoscopy:  PAP:  Bone density:  Lipid panel:  Allergies  Allergen Reactions  . Imitrex [Sumatriptan Base] Anaphylaxis  . Amoxicillin Hives  . Bactrim Hives  . Cephalexin Hives  . Erythromycin Hives  . Penicillins Hives  . Strawberry Hives  . Sulfa Antibiotics Hives  . Vancomycin Itching  . Zithromax [Azithromycin Dihydrate] Hives    Current Outpatient Prescriptions  Medication Sig Dispense Refill  . ALPRAZolam (XANAX) 1 MG tablet Take 1 tablet (1 mg total) by mouth at bedtime as needed for anxiety.  30 tablet  0  . dexlansoprazole (DEXILANT) 60 MG capsule Take 60 mg by mouth daily.      Marland Kitchen dihydroergotamine (MIGRANAL) 4 MG/ML nasal spray Place 1 spray into the nose as needed for migraine. Use in one nostril as directed.  No more than 4 sprays in one hour      . Eflornithine HCl (VANIQA)  13.9 % cream Apply 1 application topically daily.      Marland Kitchen EPIPEN 2-PAK 0.3 MG/0.3ML SOAJ injection       . escitalopram (LEXAPRO) 10 MG tablet Take 1 tablet (10 mg total) by mouth daily.  30 tablet  5  . Goserelin Acetate (ZOLADEX [place]) Inject into the skin every 30 (thirty) days. Next injection due on May 12      . hyaluronate sodium (RADIAPLEXRX) GEL Apply 1 application topically 2 (two) times daily. Apply to skin area after rad txs daily and bedtime and on wekends,2nd tube given 04/13/14      . levothyroxine (SYNTHROID, LEVOTHROID) 100 MCG tablet Take 100 mcg by mouth daily.       Marland Kitchen lidocaine-prilocaine (EMLA) cream Apply 1 application topically once as needed (if need for port).      Marland Kitchen liothyronine (CYTOMEL) 25 MCG tablet Take 12.5 mcg by mouth 2 (two) times daily.       Marland Kitchen LORazepam (ATIVAN) 0.5 MG tablet Take 1 tablet (0.5 mg total) by mouth every 6 (six) hours as needed for anxiety.  30 tablet  0  . meclizine (ANTIVERT) 25 MG tablet Take 1 tablet (25 mg total) by mouth 3 (three) times daily as needed for dizziness.  30 tablet  0  . methocarbamol (ROBAXIN) 500 MG tablet       . mometasone  (NASONEX) 50 MCG/ACT nasal spray Place 2 sprays into both nostrils daily as needed (allergies).       . non-metallic deodorant [place]) MISC Apply 1 application topically daily. Apply after rad txs daily and prn      . Nutritional Supplements (JUICE PLUS FIBRE PO) Take 3 tablets by mouth daily.       . ranitidine (ZANTAC) 300 MG tablet Take 300 mg by mouth at bedtime.      . topiramate (TOPAMAX) 100 MG tablet Take 200 mg by mouth at bedtime.      . topiramate (TOPAMAX) 100 MG tablet TAKE 2 TABLETS BY MOUTH AT BEDTIME  60 tablet  5  . traZODone (DESYREL) 50 MG tablet Take 1 tablet (50 mg total) by mouth at bedtime as needed for sleep.  30 tablet  3   No current facility-administered medications for this visit.   Facility-Administered Medications Ordered in Other Visits  Medication Dose Route Frequency Provider Last Rate Last Dose  . sodium chloride 0.9 % injection 10 mL  10 mL Intracatheter PRN Lowella Dell, MD   10 mL at 06/05/14 1603    OBJECTIVE: Young white woman who appears stated age 39 Vitals:   06/05/14 1616  BP: 106/73  Pulse: 88  Temp: 98.2 F (36.8 C)  Resp: 18     Body mass index is 22.78 kg/(m^2).    ECOG FS:1 - Symptomatic but completely ambulatory  Ocular: Sclerae unicteric, hair is back though still short, just beginning to curl Ear-nose-throat: Oropharynx clear, teeth in good repair Lymphatic: No cervical or supraclavicular adenopathy Lungs no rales or rhonchi Heart regular rate and rhythm Abd soft, obese, nontender, positive bowel sounds MSK no focal spinal tenderness, no upper extremity lymphedema Neuro: non-focal, well-oriented, positive affect Breasts: Status post bilateral mastectomies with left latissimus flap reconstruction and silicone gel implant. There is no evidence of local recurrence. Both axillae are benign.   LAB RESULTS:  CMP     Component Value Date/Time   NA 144 06/05/2014 1442   NA 144 01/23/2014 0914   K 3.7 06/05/2014 1442   K 3.7  01/23/2014 0914   CL 106 01/23/2014 0914   CO2 28 06/05/2014 1442   CO2 24 01/23/2014 0914   GLUCOSE 99 06/05/2014 1442   GLUCOSE 99 01/23/2014 0914   BUN 15.3 06/05/2014 1442   BUN 12 01/23/2014 0914   CREATININE 0.8 06/05/2014 1442   CREATININE 0.66 01/23/2014 0914   CALCIUM 9.0 06/05/2014 1442   CALCIUM 8.9 01/23/2014 0914   PROT 7.0 06/05/2014 1442   ALBUMIN 3.2* 06/05/2014 1442   AST 17 06/05/2014 1442   ALT 12 06/05/2014 1442   ALKPHOS 105 06/05/2014 1442   BILITOT 0.33 06/05/2014 1442   GFRNONAA >90 01/23/2014 0914   GFRAA >90 01/23/2014 0914    I No results found for this basename: SPEP,  UPEP,   kappa and lambda light chains    Lab Results  Component Value Date   WBC 6.4 06/05/2014   NEUTROABS 4.5 06/05/2014   HGB 12.6 06/05/2014   HCT 38.2 06/05/2014   MCV 83.7 06/05/2014   PLT 173 06/05/2014      Chemistry      Component Value Date/Time   NA 144 06/05/2014 1442   NA 144 01/23/2014 0914   K 3.7 06/05/2014 1442   K 3.7 01/23/2014 0914   CL 106 01/23/2014 0914   CO2 28 06/05/2014 1442   CO2 24 01/23/2014 0914   BUN 15.3 06/05/2014 1442   BUN 12 01/23/2014 0914   CREATININE 0.8 06/05/2014 1442   CREATININE 0.66 01/23/2014 0914      Component Value Date/Time   CALCIUM 9.0 06/05/2014 1442   CALCIUM 8.9 01/23/2014 0914   ALKPHOS 105 06/05/2014 1442   AST 17 06/05/2014 1442   ALT 12 06/05/2014 1442   BILITOT 0.33 06/05/2014 1442       No results found for this basename: LABCA2    No components found with this basename: LABCA125    No results found for this basename: INR,  in the last 168 hours  Urinalysis    Component Value Date/Time   COLORURINE YELLOW 09/12/2013 [place] 09/12/2013 1506   LABSPEC 1.015 09/12/2013 1506   PHURINE 7.5 09/12/2013 1506   GLUCOSEU NEG 09/12/2013 1506   HGBUR NEG 09/12/2013 1506   BILIRUBINUR NEG 09/12/2013 1506   KETONESUR NEG 09/12/2013 1506   PROTEINUR NEG 09/12/2013 1506   UROBILINOGEN 0.2 09/12/2013 1506   NITRITE NEG  09/12/2013 1506   LEUKOCYTESUR NEG 09/12/2013 1506    STUDIES:  Transthoracic Echocardiography  Patient: Haniyyah, Sakuma MR #: [id number] Study Date: 05/03/2014 Gender: F Age: 71 Height: 157.5 cm Weight: 91.6 kg BSA: 2.05 m^2 Pt. Status: Room:  ATTENDING Jenna Luo T REFERRING Jenna Luo T SONOGRAPHER [facility], RDCS ORDERING Bensimhon, Daniel REFERRING Bensimhon, Daniel PERFORMING [facility], Outpatient  cc:  ------------------------------------------------------------------- LV EF: 60% - 65%   ASSESSMENT: 43 y.o. BRCA negative [place] woman  (1) status post right lower outer quadrant and right axillary lymph node biopsy 08/16/2013 ([id number]), both positive for a clinical T3 N1, stage IIIA invasive ductal carcinoma, grade 2, estrogen receptor 100% positive, progesterone receptor 100% positive, with an MIB-1 of 72% and HER-2 positive with a signals ratio of 5.77 and the number per cell 7.50  (2) biopsy of a questionable area in the left breast 09/12/2013 showed only fibrocystic changes ([id number])  (3) neoadjuvant chemotherapy consisted with carboplatin, docetaxel, pertuzumab and trastuzumab given every 21 days for 6 cycles between 09/19/2013 and 01/09/2014  (4) trastuzumab alone being continued to the total  one year (through December 2015). Most recent echocardiogram 05/03/2014 showed a well preserved ejection fraction  (5) bilateral mastectomies with right axillary lymph node dissection 02/01/2014 showed ([id number])  (a) on the left, benign breast tissue   (b) on the right, pTis pN0 (no invasive malignancy, all 11 nodes sampled clear)  (6) adjuvant radiation to completed 05/08/2014  (7) adjuvant anti-estrogens to follow radiation  (8) genetic testing March 2015 showed no mutations in ATM, BARD1, BRCA1, BRCA2, BRIP1, CDH1, CHEK2, EPCAM, FANCC, MLH1, MSH2, MSH6, NBN, PALB2, PMS2, PTEN, RAD51C, RAD51D, STK11, TP53, and XRCC2.   (9) on monthly  goserelin as of 09/25/2013  (10) status post left latissimus flap reconstruction, with the same plan for the right side no sooner than 6 months after completion of radiation  (11) to start anastrozole 06/06/2014   PLAN: Chera is moving to [place]. I did not realize that she was going straight from here so I have not made the appropriate connections for her. She will need goserelin next on September 29. She will need her next trastuzumab October 13.  Today we discussed anastrozole and menopausal issues for approximately 45 minutes.. She understands that above and beyond the reduction in risk that she has obtained via undergoing chemotherapy and by continuing on trastuzumab through the end of this year, she will cut the residual risk in half by taking anastrozole for 5 years.  We discussed the complications of menopause, including weight gain vaginal dryness insomnia her mood changes hot flashes and in her bones. Some of these already being addressed (her primary care physician has started her on Lexapro, with significant improvement in her mood). The point of this list is for her to know that although temporarily some of these symptoms will be associated with anastrozole, it does not cause weight gain or mood changes or insomnia. It does worsen hot flashes, vaginal dryness, and bone density loss.  In addition, anastrozole can worsen her cholesterol profile and in perhaps 1/5 women it can produce a fibromyalgia-like syndrome of the arthralgias and myalgias. This is reversible, but it does cause about 1 in 5 women to stop this drug and seek an alternative.  Harryette is a good understanding of the possible toxicities, side effects and complications of anastrozole. She feels she is sufficiently recovered from her treatments that she can start. I went ahead and wrote her the prescription. She will call me with any questions or concerns.  I will be referring her to the [place] [facility]ology group in  [place]. They should be calling her sometime in the next few days and if she has not heard from them by next Monday she will let me know. I am not making her a return appointment here but she knows that we'll be glad to see her or troubleshoot any problem as the need arises.  Chauncey Cruel, MD   06/05/2014 4:28 PM

## 2014-06-05 NOTE — Patient Instructions (Signed)
[facility] Discharge Instructions for Patients Receiving Chemotherapy  Today you received the following chemotherapy agents: herceptin  To help prevent nausea and vomiting after your treatment, we encourage you to take your nausea medication.  Take it as often as prescribed.     If you develop nausea and vomiting that is not controlled by your nausea medication, call the clinic. If it is after clinic hours your family physician or the after hours number for the clinic or go to the Emergency Department.   BELOW ARE SYMPTOMS THAT SHOULD BE REPORTED IMMEDIATELY:  *FEVER GREATER THAN 100.5 F  *CHILLS WITH OR WITHOUT FEVER  NAUSEA AND VOMITING THAT IS NOT CONTROLLED WITH YOUR NAUSEA MEDICATION  *UNUSUAL SHORTNESS OF BREATH  *UNUSUAL BRUISING OR BLEEDING  TENDERNESS IN MOUTH AND THROAT WITH OR WITHOUT PRESENCE OF ULCERS  *URINARY PROBLEMS  *BOWEL PROBLEMS  UNUSUAL RASH Items with * indicate a potential emergency and should be followed up as soon as possible.  Feel free to call the clinic you have any questions or concerns. The clinic phone number is [phone].   I have been informed and understand all the instructions given to me. I know to contact the clinic, my physician, or go to the Emergency Department if any problems should occur. I do not have any questions at this time, but understand that I may call the clinic during office hours   should I have any questions or need assistance in obtaining follow up care.    __________________________________________  _____________  __________ Signature of Patient or Authorized Representative            Date                   Time    __________________________________________ Nurse's Signature    

## 2014-06-06 ENCOUNTER — Other Ambulatory Visit: Payer: Self-pay | Admitting: Emergency Medicine

## 2014-06-06 NOTE — Addendum Note (Signed)
Addended by: Jaci Carrel A on: 06/06/2014 08:55 AM   Modules accepted: Orders

## 2014-06-14 ENCOUNTER — Ambulatory Visit: Payer: BC Managed Care – PPO | Admitting: Radiation Oncology

## 2014-06-18 ENCOUNTER — Ambulatory Visit: Payer: BC Managed Care – PPO

## 2014-06-26 ENCOUNTER — Ambulatory Visit: Payer: BC Managed Care – PPO

## 2014-07-09 ENCOUNTER — Ambulatory Visit: Payer: BC Managed Care – PPO

## 2014-07-16 ENCOUNTER — Encounter: Payer: Self-pay | Admitting: Radiation Oncology

## 2014-07-17 ENCOUNTER — Ambulatory Visit: Payer: BC Managed Care – PPO

## 2014-07-31 ENCOUNTER — Inpatient Hospital Stay (HOSPITAL_COMMUNITY): Admission: RE | Admit: 2014-07-31 | Payer: BC Managed Care – PPO | Source: Ambulatory Visit

## 2014-07-31 ENCOUNTER — Other Ambulatory Visit (HOSPITAL_COMMUNITY): Payer: BC Managed Care – PPO

## 2014-08-17 ENCOUNTER — Other Ambulatory Visit: Payer: Self-pay | Admitting: Family Medicine

## 2014-08-17 NOTE — Telephone Encounter (Signed)
Medication refilled per protocol. 

## 2014-09-16 ENCOUNTER — Other Ambulatory Visit: Payer: Self-pay | Admitting: Family Medicine

## 2015-12-30 ENCOUNTER — Encounter

## 2015-12-30 ENCOUNTER — Inpatient Hospital Stay: Admit: 2015-12-30 | Payer: BLUE CROSS/BLUE SHIELD | Primary: Family Medicine

## 2015-12-30 DIAGNOSIS — R918 Other nonspecific abnormal finding of lung field: Secondary | ICD-10-CM

## 2016-10-02 ENCOUNTER — Other Ambulatory Visit: Payer: Self-pay | Admitting: Nurse Practitioner

## 2017-07-19 ENCOUNTER — Ambulatory Visit

## 2017-07-19 ENCOUNTER — Inpatient Hospital Stay: Payer: BLUE CROSS/BLUE SHIELD

## 2017-07-19 LAB — HCG URINE, QL. - POC: Pregnancy test,urine (POC): NEGATIVE

## 2017-07-19 MED ORDER — LACTATED RINGERS IV
INTRAVENOUS | Status: DC
Start: 2017-07-19 — End: 2017-07-19
  Administered 2017-07-19: 12:00:00 via INTRAVENOUS

## 2017-07-19 MED ORDER — LIDOCAINE (PF) 20 MG/ML (2 %) IJ SOLN
20 mg/mL (2 %) | INTRAMUSCULAR | Status: AC
Start: 2017-07-19 — End: ?

## 2017-07-19 MED ORDER — FAMOTIDINE (PF) 20 MG/2 ML IV
202 mg/2 mL | Freq: Once | INTRAVENOUS | Status: AC
Start: 2017-07-19 — End: 2017-07-19
  Administered 2017-07-19: 12:00:00 via INTRAVENOUS

## 2017-07-19 MED ORDER — LIDOCAINE (PF) 10 MG/ML (1 %) IJ SOLN
10 mg/mL (1 %) | INTRAMUSCULAR | Status: DC | PRN
Start: 2017-07-19 — End: 2017-07-19

## 2017-07-19 MED ORDER — SODIUM CHLORIDE 0.9 % IJ SYRG
INTRAMUSCULAR | Status: DC | PRN
Start: 2017-07-19 — End: 2017-07-19

## 2017-07-19 MED ORDER — FENTANYL CITRATE (PF) 50 MCG/ML IJ SOLN
50 mcg/mL | INTRAMUSCULAR | Status: DC | PRN
Start: 2017-07-19 — End: 2017-07-19

## 2017-07-19 MED ORDER — PROPOFOL 10 MG/ML IV EMUL
10 mg/mL | INTRAVENOUS | Status: DC | PRN
Start: 2017-07-19 — End: 2017-07-19
  Administered 2017-07-19: 13:00:00 via INTRAVENOUS

## 2017-07-19 MED ORDER — PROPOFOL 10 MG/ML IV EMUL
10 mg/mL | INTRAVENOUS | Status: AC
Start: 2017-07-19 — End: ?

## 2017-07-19 MED ORDER — LACTATED RINGERS IV
INTRAVENOUS | Status: DC
Start: 2017-07-19 — End: 2017-07-19

## 2017-07-19 MED ORDER — ONDANSETRON (PF) 4 MG/2 ML INJECTION
4 mg/2 mL | Freq: Once | INTRAMUSCULAR | Status: DC
Start: 2017-07-19 — End: 2017-07-19

## 2017-07-19 MED ORDER — SODIUM CHLORIDE 0.9 % IJ SYRG
Freq: Three times a day (TID) | INTRAMUSCULAR | Status: DC
Start: 2017-07-19 — End: 2017-07-19

## 2017-07-19 MED ORDER — GLUCOSE 4 GRAM CHEWABLE TAB
4 gram | ORAL | Status: DC | PRN
Start: 2017-07-19 — End: 2017-07-19

## 2017-07-19 MED ORDER — DEXTROSE 50% IN WATER (D50W) IV SYRG
INTRAVENOUS | Status: DC | PRN
Start: 2017-07-19 — End: 2017-07-19

## 2017-07-19 MED ORDER — GLUCAGON 1 MG INJECTION
1 mg | INTRAMUSCULAR | Status: DC | PRN
Start: 2017-07-19 — End: 2017-07-19

## 2017-07-19 MED FILL — BD POSIFLUSH NORMAL SALINE 0.9 % INJECTION SYRINGE: INTRAMUSCULAR | Qty: 10

## 2017-07-19 MED FILL — LACTATED RINGERS IV: INTRAVENOUS | Qty: 1000

## 2017-07-19 MED FILL — XYLOCAINE-MPF 20 MG/ML (2 %) INJECTION SOLUTION: 20 mg/mL (2 %) | INTRAMUSCULAR | Qty: 5

## 2017-07-19 MED FILL — FAMOTIDINE (PF) 20 MG/2 ML IV: 20 mg/2 mL | INTRAVENOUS | Qty: 2

## 2017-07-19 MED FILL — PROPOFOL 10 MG/ML IV EMUL: 10 mg/mL | INTRAVENOUS | Qty: 20

## 2017-07-19 NOTE — H&P (Addendum)
[facility]   [email or web address]      Impression:   1.reflux   2. Regurgitation        Plan:     1. egd dil mac all riks benefits and alt discussed       Chief Complaint: reflux regurgitation       HPI:  Ariana Bishop is a 46 y.o. female who is being seen on consult for reflux regurgitation     PMH:   Past Medical History:   Diagnosis Date   ??? Arthritis    ??? Cancer (HCC)     breast   ??? GERD (gastroesophageal reflux disease)    ??? Hypothyroid    ??? Nausea & vomiting        PSH:   Past Surgical History:   Procedure Laterality Date   ??? HX BREAST RECONSTRUCTION      7 surgeries   ??? HX CESAREAN SECTION     ??? HX MASTECTOMY      double       Social HX:   Social History     Socioeconomic History   ??? Marital status: MARRIED     Spouse name: Not on file   ??? Number of children: Not on file   ??? Years of education: Not on file   ??? Highest education level: Not on file   Social Needs   ??? Financial resource strain: Not on file   ??? Food insecurity - worry: Not on file   ??? Food insecurity - inability: Not on file   ??? Transportation needs - medical: Not on file   ??? Transportation needs - non-medical: Not on file   Occupational History   ??? Not on file   Tobacco Use   ??? Smoking status: Never Smoker   ??? Smokeless tobacco: Never Used   Substance and Sexual Activity   ??? Alcohol use: Yes     Comment: wine  in the evening   ??? Drug use: No   ??? Sexual activity: Not on file   Other Topics Concern   ??? Not on file   Social History Narrative   ??? Not on file       FHX:   History reviewed. No pertinent family history.    Allergy:   Allergies   Allergen Reactions   ??? Imitrex [Sumatriptan Succinate] Anaphylaxis   ??? Amoxicillin Hives   ??? Bactrim [Sulfamethoprim] Hives   ??? Erythromycin Hives   ??? Keflex [Cephalexin] Hives   ??? Penicillins Hives   ??? Strawberry Hives   ??? Sulfa (Sulfonamide Antibiotics) Hives   ??? Zithromax [Azithromycin] Hives       Home Medications:     Medications Prior to Admission    Medication Sig   ??? levothyroxine (SYNTHROID) 125 mcg tablet Take  by mouth Daily (before breakfast).   ??? liothyronine (CYTOMEL) 25 mcg tablet Take 25 mcg by mouth daily. 1/2 tab in the morning & 1/2 tab in the evening   ??? Dexlansoprazole (DEXILANT) 60 mg CpDB Take  by mouth daily.   ??? raNITIdine hcl 300 mg cap Take  by mouth every evening.   ??? Cetirizine (ZYRTEC) 10 mg cap Take  by mouth nightly.   ??? escitalopram oxalate (LEXAPRO) 10 mg tablet Take 10 mg by mouth every evening.   ??? tamoxifen (NOLVADEX) 20 mg tablet Take 20 mg by mouth every evening.   ??? traZODone (DESYREL) 50 mg tablet Take 50 mg by mouth nightly.  Review of Systems:     Constitutional: No fevers, chills, weight loss, fatigue.   Skin: No rashes, pruritis, jaundice, ulcerations, erythema.   HENT: No headaches, nosebleeds, sinus pressure, rhinorrhea, sore throat.   Eyes: No visual changes, blurred vision, eye pain, photophobia, jaundice.   Cardiovascular: No chest pain, heart palpitations.   Respiratory: No cough, SOB, wheezing, chest discomfort, orthopnea.   Gastrointestinal: Reflux dysphagia epigastric pain dark stools    Genitourinary: No dysuria, bleeding, discharge, pyuria.   Musculoskeletal: No weakness, arthralgias, wasting.   Endo: No sweats.   Heme: No bruising, easy bleeding.   Allergies: As noted.   Neurological: Cranial nerves intact.  Alert and oriented. Gait not assessed.   Psychiatric:  No anxiety, depression, hallucinations.                 Visit Vitals  BP 137/89   Temp 98.8 ??F (37.1 ??C)   Resp 18   Ht 5' 2"  (1.575 m)   Wt 113.4 kg (250 lb 1 oz)   SpO2 96%   BMI 45.74 kg/m??       Physical Assessment:     constitutional: appearance: well developed, well nourished, normal habitus, no deformities, in no acute distress.   skin: inspection: no rashes, ulcers, icterus or other lesions; no clubbing or telangiectasias. palpation: no induration or subcutaneos nodules.   eyes: inspection: normal conjunctivae and lids; no jaundice pupils:  symmetrical, normoreactive to light, normal accommodation and size.   ENMT: mouth: normal oral mucosa,lips and gums; good dentition. oropharynx: normal tongue, hard and soft palate; posterior pharynx without erythema, exudate or lesions.   neck: no masses organomegaly or tenderness.   respiratory: effort: normal chest excursion; no intercostal retraction or accessory muscle use.   cardiovascular: abdominal aorta: normal size and position; no bruits. palpation: PMI of normal size and position; normal rhythm; no thrill or murmurs.   abdominal: abdomen: normal consistency; no tenderness or masses. hernias: no hernias appreciated. liver: normal size and consistency. spleen: not palpable.   rectal: hemoccult/guaiac: not performed.   musculoskeletal: no deformities or muscle wasting   lymphatic: axilae: not palpable. groin: not palpable. neck: within normal limits. other: not palpable.   neurologic: cranial nerves: II-XII normal.   psychiatric: judgement/insight: within normal limits. memory: within normal limits for recent and remote events. mood and affect: no evidence of depression, anxiety or agitation. orientation: oriented to time, space and person.        Basic Metabolic Profile   No results for input(s): NA, K, CL, CO2, BUN, GLU, CA, MG, PHOS in the last 72 hours.    No lab exists for component: CREAT      CBC w/Diff    No results for input(s): WBC, RBC, HGB, HCT, MCV, MCH, MCHC, RDW, PLT, HGBEXT, HCTEXT, PLTEXT in the last 72 hours.    No lab exists for component: MPV No results for input(s): GRANS, LYMPH, EOS, PRO, MYELO, METAS, BLAST in the last 72 hours.    No lab exists for component: MONO, BASO     Hepatic Function   No results for input(s): ALB, TP, TBILI, GPT, SGOT, AP, AML, LPSE in the last 72 hours.    No lab exists for component: DBILI       Jonell Cluck, MD, M.D.   [facility] [place]  [email or web address]

## 2017-07-19 NOTE — Other (Signed)
Patient armband removed and shredded  Patient confirmed by two identifiers with discharge instructions prior too being provided to patient and spouse.

## 2017-07-19 NOTE — Anesthesia Post-Procedure Evaluation (Signed)
Procedure(s):  ENDOSCOPY with biopsies.    Anesthesia Post Evaluation      Multimodal analgesia: multimodal analgesia used between 6 hours prior to anesthesia start to PACU discharge  Patient location during evaluation: bedside  Patient participation: complete - patient participated  Level of consciousness: awake  Pain management: adequate  Airway patency: patent  Anesthetic complications: no  Cardiovascular status: acceptable  Respiratory status: acceptable  Hydration status: acceptable        Visit Vitals  BP 129/83 (BP Patient Position: At rest)   Pulse 84   Temp 36.9 ??C (98.4 ??F)   Resp 17   Ht 5\' 2"  (1.575 m)   Wt 113.4 kg (250 lb 1 oz)   SpO2 97%   BMI 45.74 kg/m??

## 2017-07-19 NOTE — Anesthesia Pre-Procedure Evaluation (Signed)
Anesthetic History     PONV          Review of Systems / Medical History  Patient summary reviewed and pertinent labs reviewed    Pulmonary  Within defined limits                 Neuro/Psych   Within defined limits           Cardiovascular                    Comments: Threw up this AM   GI/Hepatic/Renal     GERD           Endo/Other      Hypothyroidism  Arthritis     Other Findings              Physical Exam    Airway  Mallampati: III  TM Distance: 4 - 6 cm  Neck ROM: normal range of motion   Mouth opening: Normal     Cardiovascular  Regular rate and rhythm,  S1 and S2 normal,  no murmur, click, rub, or gallop             Dental  No notable dental hx       Pulmonary  Breath sounds clear to auscultation               Abdominal  GI exam deferred       Other Findings            Anesthetic Plan    ASA: 2  Anesthesia type: MAC          Induction: Intravenous  Anesthetic plan and risks discussed with: Patient and Family

## 2017-07-22 ENCOUNTER — Encounter

## 2017-07-23 ENCOUNTER — Inpatient Hospital Stay: Admit: 2017-07-23 | Payer: BLUE CROSS/BLUE SHIELD | Attending: Gastroenterology | Primary: Family Medicine

## 2017-07-23 DIAGNOSIS — K2970 Gastritis, unspecified, without bleeding: Secondary | ICD-10-CM

## 2017-07-23 MED ORDER — TECHNETIUM TC 99M SULFUR COLLOID
Freq: Once | Status: AC
Start: 2017-07-23 — End: 2017-07-23
  Administered 2017-07-23: 18:00:00 via ORAL

## 2017-07-27 MED FILL — LACTATED RINGERS IV: INTRAVENOUS | Qty: 200

## 2017-07-27 MED FILL — PROPOFOL 10 MG/ML IV EMUL: 10 mg/mL | INTRAVENOUS | Qty: 140

## 2019-05-09 ENCOUNTER — Encounter: Primary: Family Medicine

## 2019-05-11 NOTE — Other (Signed)
1140 am Reviewed patients medical history with anesthesiologist Dr Gregus- verbalizes patient ok for surgery here at [facility].

## 2019-05-11 NOTE — Interval H&P Note (Signed)
[facility]  PREOPERATIVE INSTRUCTIONS    How to prepare:    Nothing to eat or drink after midnight the night before surgery, unless otherwise specified.  This includes gum and mints.  You may brush your teeth in the morning, however do not swallow any water.  Stop taking aspirin and aspirin products including Motrin, Advil, Ibuprofen, Aleve, Excedrin, BC Powder, fish oil, vitamins and herbals 2 weeks prior to surgery or as directed by your physician.  Tylenol/Acetaminophen are okay.  Do not take any medication the morning of your procedure without your physician's approval.  Please speak with the Pre-Admission testing nurse at [phone] regarding specific instructions about your medications.  Please remove all jewelry and body piercings and leave all valuables at home.  Remove nail polish and contact lenses.  You MUST make arrangements for a responsible adult to take you home after your surgery, analgesia or sedation.  It is strongly suggested that a responsible adult stay with you during the first 24 hours.  Please follow your surgeon's instructions regarding the [facility] need [facility].  If [facility] are [facility] due to any unforeseen circumstance or your are ill the morning of surgery and need to cancel or be delayed, please call [facility] at [phone] or [phone]  If you need to cancel your surgery prior to the day of surgery, call your surgeon.      What to bring with you:    Paperwork from your doctor's office that you have been given.  Insurance cards, a picture ID and a method to pay your insurance co-pay.  Wear comfortable, loose fitting clothing that will be easy for you to put back on after surgery.  Diabetics, please check your blood sugar on the day of surgery, just prior to coming to [facility] and report the results to per-operative nurse.  If you have insulin, do not take it but please bring it with you.  Hold Glucophage,  Metformin, Glucovance for 24 hours. Prior to surgery.  DO NOT take any diabetic medications the morning of surgery.  If you have asthma and use inhalers, please bring them with you.  If you have an Advanced Directive, please bring the necessary paperwork with you.              I have reviewed the above instructions:              Patient: Ariana Bishop  Date:     May 11, 2019 Time:   10:17 AM       RN:  Georgana Curio, RN  Date:     May 11, 2019 Time:   10:17 AM

## 2019-05-11 NOTE — Interval H&P Note (Signed)
1140 am Reviewed patients medical history with anesthesiologist Dr Randa Evens- verbalizes patient ok for surgery here at [facility].

## 2019-05-11 NOTE — Other (Signed)
[facility]    How to prepare:    Nothing to eat or drink after midnight the night before surgery, unless otherwise specified.  This includes gum and mints.  You may brush your teeth in the morning, however do not swallow any water.  Stop taking aspirin and aspirin products including Motrin, Advil, Ibuprofen, Aleve, Excedrin, BC Powder, fish oil, vitamins and herbals 2 weeks prior to surgery or as directed by your physician.  Tylenol/Acetaminophen are okay.  Do not take any medication the morning of your procedure without your physician's approval.  Please speak with the Pre-Admission testing nurse at [phone] regarding specific instructions about your medications.  Please remove all jewelry and body piercings and leave all valuables at home.  Remove nail polish and contact lenses.  You MUST make arrangements for a responsible adult to take you home after your surgery, analgesia or sedation.  It is strongly suggested that a responsible adult stay with you during the first 24 hours.  Please follow your surgeon's instructions regarding the time you need to arrive at [facility].  If you are late [facility] due to any unforeseen circumstance or your are ill the morning of surgery and need to cancel or be delayed, please call [facility] at [phone] or [phone]  If you need to cancel your surgery prior to the day of surgery, call your surgeon.      What to bring with you:    Paperwork from your doctor's office that you have been given.  Insurance cards, a picture ID and a method to pay your insurance co-pay.  Wear comfortable, loose fitting clothing that will be easy for you to put back on after surgery.  Diabetics, please check your blood sugar on the day of surgery, just prior to coming to [facility] and report the results to per-operative nurse.  If you have insulin, do not take it but please bring it with you.   Hold Glucophage, Metformin, Glucovance for 24 hours. Prior to surgery.  DO NOT take any diabetic medications the morning of surgery.  If you have asthma and use inhalers, please bring them with you.  If you have an Advanced Directive, please bring the necessary paperwork with you.              I have reviewed the above instructions:              Patient: Ariana Bishop  Date:     May 11, 2019 Time:   10:17 AM       RN:  Norm Parcel, RN  Date:     May 11, 2019 Time:   10:17 AM

## 2019-05-16 ENCOUNTER — Inpatient Hospital Stay: Payer: BLUE CROSS/BLUE SHIELD

## 2019-05-16 DIAGNOSIS — N858 Other specified noninflammatory disorders of uterus: Secondary | ICD-10-CM

## 2019-05-16 LAB — HCG URINE, QL. - POC
HCG, Pregnancy, Urine, POC: NEGATIVE
Pregnancy test,urine (POC): NEGATIVE

## 2019-05-16 MED ORDER — LACTATED RINGERS IV
INTRAVENOUS | Status: DC
Start: 2019-05-16 — End: 2019-05-16

## 2019-05-16 MED ORDER — PROPOFOL 10 MG/ML IV EMUL
10 mg/mL | INTRAVENOUS | Status: DC | PRN
Start: 2019-05-16 — End: 2019-05-16
  Administered 2019-05-16: 17:00:00 via INTRAVENOUS

## 2019-05-16 MED ORDER — KETOROLAC TROMETHAMINE 30 MG/ML INJECTION
30 mg/mL (1 mL) | INTRAMUSCULAR | Status: DC | PRN
Start: 2019-05-16 — End: 2019-05-16
  Administered 2019-05-16: 17:00:00 via INTRAVENOUS

## 2019-05-16 MED ORDER — ONDANSETRON (PF) 4 MG/2 ML INJECTION
4 mg/2 mL | INTRAMUSCULAR | Status: DC | PRN
Start: 2019-05-16 — End: 2019-05-16
  Administered 2019-05-16: 17:00:00 via INTRAVENOUS

## 2019-05-16 MED ORDER — LACTATED RINGERS IV
INTRAVENOUS | Status: DC
Start: 2019-05-16 — End: 2019-05-16
  Administered 2019-05-16 (×3): via INTRAVENOUS

## 2019-05-16 MED ORDER — FENTANYL CITRATE (PF) 50 MCG/ML IJ SOLN
50 mcg/mL | INTRAMUSCULAR | Status: DC | PRN
Start: 2019-05-16 — End: 2019-05-16
  Administered 2019-05-16 (×2): via INTRAVENOUS

## 2019-05-16 MED ORDER — LACTATED RINGERS IRRIGATION SOLN
Status: DC | PRN
Start: 2019-05-16 — End: 2019-05-16
  Administered 2019-05-16: 18:00:00

## 2019-05-16 MED ORDER — MIDAZOLAM 1 MG/ML IJ SOLN
1 mg/mL | INTRAMUSCULAR | Status: DC | PRN
Start: 2019-05-16 — End: 2019-05-16
  Administered 2019-05-16 (×2): via INTRAVENOUS

## 2019-05-16 MED ORDER — NALOXONE 0.4 MG/ML INJECTION
0.4 mg/mL | INTRAMUSCULAR | Status: DC | PRN
Start: 2019-05-16 — End: 2019-05-16

## 2019-05-16 MED ORDER — MEPERIDINE (PF) 25 MG/ML INJ SOLUTION
25 mg/ml | INTRAMUSCULAR | Status: DC | PRN
Start: 2019-05-16 — End: 2019-05-16

## 2019-05-16 MED ORDER — HYDROMORPHONE (PF) 1 MG/ML IJ SOLN
1 mg/mL | INTRAMUSCULAR | Status: DC | PRN
Start: 2019-05-16 — End: 2019-05-16

## 2019-05-16 MED ORDER — OXYCODONE 5 MG TAB
5 mg | ORAL | Status: DC | PRN
Start: 2019-05-16 — End: 2019-05-16
  Administered 2019-05-16: 18:00:00 via ORAL

## 2019-05-16 MED ORDER — FENTANYL CITRATE (PF) 50 MCG/ML IJ SOLN
50 mcg/mL | INTRAMUSCULAR | Status: DC | PRN
Start: 2019-05-16 — End: 2019-05-16
  Administered 2019-05-16 (×2): via INTRAVENOUS

## 2019-05-16 MED ORDER — EPHEDRINE SULFATE 50 MG/ML INJECTION SOLUTION
50 mg/mL | INTRAMUSCULAR | Status: DC | PRN
Start: 2019-05-16 — End: 2019-05-16
  Administered 2019-05-16: 17:00:00 via INTRAVENOUS

## 2019-05-16 MED ORDER — SCOPOLAMINE (1.3-1.5) MG 72 HR TRANSDERM PATCH
1 mg over 3 days | TRANSDERMAL | Status: DC
Start: 2019-05-16 — End: 2019-05-16

## 2019-05-16 MED ORDER — FLUMAZENIL 0.1 MG/ML IV SOLN
0.1 mg/mL | INTRAVENOUS | Status: DC | PRN
Start: 2019-05-16 — End: 2019-05-16

## 2019-05-16 MED ORDER — LIDOCAINE (PF) 20 MG/ML (2 %) IJ SOLN
20 mg/mL (2 %) | INTRAMUSCULAR | Status: DC | PRN
Start: 2019-05-16 — End: 2019-05-16
  Administered 2019-05-16: 16:00:00 via INTRAVENOUS

## 2019-05-16 MED ORDER — DEXMEDETOMIDINE 100 MCG/ML IV SOLN
100 mcg/mL | INTRAVENOUS | Status: DC | PRN
Start: 2019-05-16 — End: 2019-05-16
  Administered 2019-05-16: 17:00:00 via INTRAVENOUS

## 2019-05-16 MED ORDER — OXYCODONE-ACETAMINOPHEN 5 MG-325 MG TAB
5-325 mg | Freq: Once | ORAL | Status: DC | PRN
Start: 2019-05-16 — End: 2019-05-16

## 2019-05-16 MED ORDER — DEXAMETHASONE SODIUM PHOSPHATE 4 MG/ML IJ SOLN
4 mg/mL | INTRAMUSCULAR | Status: DC | PRN
Start: 2019-05-16 — End: 2019-05-16
  Administered 2019-05-16: 17:00:00 via INTRAVENOUS

## 2019-05-16 MED FILL — NALOXONE 0.4 MG/ML INJECTION: 0.4 mg/mL | INTRAMUSCULAR | Qty: 0.25

## 2019-05-16 MED FILL — FENTANYL CITRATE (PF) 50 MCG/ML IJ SOLN: 50 mcg/mL | INTRAMUSCULAR | Qty: 1

## 2019-05-16 MED FILL — OXYCODONE 5 MG TAB: 5 mg | ORAL | Qty: 2

## 2019-05-16 MED FILL — LACTATED RINGERS IV: INTRAVENOUS | Qty: 1000

## 2019-05-16 MED FILL — DEMEROL (PF) 25 MG/ML INJECTION SYRINGE: 25 mg/mL | INTRAMUSCULAR | Qty: 1

## 2019-05-16 MED FILL — FLUMAZENIL 0.1 MG/ML IV SOLN: 0.1 mg/mL | INTRAVENOUS | Qty: 2

## 2019-05-16 MED FILL — TRANSDERM-SCOP 1 MG OVER 3 DAYS TRANSDERMAL PATCH: 1 mg over 3 days | TRANSDERMAL | Qty: 1

## 2019-05-16 MED FILL — OXYCODONE-ACETAMINOPHEN 5 MG-325 MG TAB: 5-325 mg | ORAL | Qty: 2

## 2019-05-16 MED FILL — HYDROMORPHONE (PF) 1 MG/ML IJ SOLN: 1 mg/mL | INTRAMUSCULAR | Qty: 0.5

## 2019-05-16 NOTE — Anesthesia Pre-Procedure Evaluation (Signed)
Anesthetic History   No history of anesthetic complications  PONV          Review of Systems / Medical History  Patient summary reviewed and pertinent labs reviewed    Pulmonary  Within defined limits                 Neuro/Psych   Within defined limits          Comments: anxiety Cardiovascular    Hypertension: well controlled              Exercise tolerance: >4 METS  Comments: Threw up this AM   GI/Hepatic/Renal     GERD: well controlled           Endo/Other      Hypothyroidism: well controlled  Morbid obesity and arthritis     Other Findings              Physical Exam    Airway  Mallampati: III  TM Distance: 4 - 6 cm  Neck ROM: normal range of motion   Mouth opening: Normal     Cardiovascular  Regular rate and rhythm,  S1 and S2 normal,  no murmur, click, rub, or gallop             Dental  No notable dental hx       Pulmonary  Breath sounds clear to auscultation               Abdominal  GI exam deferred       Other Findings            Anesthetic Plan    ASA: 3  Anesthesia type: general          Induction: Intravenous  Anesthetic plan and risks discussed with: Patient

## 2019-05-16 NOTE — Anesthesia Post-Procedure Evaluation (Signed)
Procedure(s):  DILATATION AND CURETTAGE HYSTEROSCOPY.    general    Anesthesia Post Evaluation      Multimodal analgesia: multimodal analgesia used between 6 hours prior to anesthesia start to PACU discharge  Patient location during evaluation: PACU  Patient participation: complete - patient participated  Level of consciousness: awake  Pain management: adequate  Airway patency: patent  Anesthetic complications: no  Cardiovascular status: acceptable  Respiratory status: acceptable  Hydration status: acceptable  Post anesthesia nausea and vomiting:  controlled      INITIAL Post-op Vital signs:   Vitals Value Taken Time   BP 141/68 05/16/2019  2:12 PM   Temp 36.8 ??C (98.2 ??F) 05/16/2019  2:14 PM   Pulse 85 05/16/2019  2:15 PM   Resp 16 05/16/2019  2:12 PM   SpO2 94 % 05/16/2019  2:15 PM   Vitals shown include unvalidated device data.

## 2019-05-16 NOTE — Op Note (Signed)
Surgical op note reoperative diagnosis is thickened endometrium on tamoxifen    Postoperative diagnosis is the same with uterine fibroids    Surgeon Janeal Holmes, Brooke Bonito., MD    Anesthesia General CA    Estimated fluid loss was 200 cc    What was done patient was taken the operating room and after satisfactory general anesthesia was obtained the patient was placed in dorsolithotomy position prepped and draped in usual fashion for hysteroscopy and D&C a weighted speculum was placed in the patient's vagina and a tenaculum  placed on the anterior lip of the uterine cervix.  At this point an attempt was made to dilate the cervix with the dilators on the table which was unsuccessful and therefore lacrimal duct probes were used.    Starting with the smallest lacrimal duct and coming up to the largest the cervix was dilated then using Pratt dilators the uterus was or the uterine cervix was again dilated up to a #16 dilator    At this point the hysteroscope was advanced through the cervical loss into the uterine cavity revealed revealing a uterine fibroid on the posterior wall of the uterus and using the MyoSure of the uterine fibroid was removed.    At this point the uterine cavity was curetted productive of a scant amount of tissue there was noted to be adequate hemostasis and the scope was reinserted through the cervical loss in the uterine cavity and it appeared that there had been an adequate sampling of the uterus    At this point the patient was taken out of the dorsolithotomy position and taken to the recovery room in satisfactory condition sponge and instrument counts were correct and Dr. Monica Bishop dictating operative report on Ariana Bishop.

## 2019-05-16 NOTE — Anesthesia Pre-Procedure Evaluation (Signed)
Anesthetic History   No history of anesthetic complications  PONV          Review of Systems / Medical History  Patient summary reviewed and pertinent labs reviewed    Pulmonary  Within defined limits                 Neuro/Psych   Within defined limits          Comments: anxiety Cardiovascular    Hypertension: well controlled              Exercise tolerance: >4 METS  Comments: Threw up this AM   GI/Hepatic/Renal     GERD: well controlled           Endo/Other      Hypothyroidism: well controlled  Morbid obesity and arthritis     Other Findings              Physical Exam    Airway  Mallampati: III  TM Distance: 4 - 6 cm  Neck ROM: normal range of motion   Mouth opening: Normal     Cardiovascular  Regular rate and rhythm,  S1 and S2 normal,  no murmur, click, rub, or gallop             Dental  No notable dental hx       Pulmonary  Breath sounds clear to auscultation               Abdominal  GI exam deferred       Other Findings            Anesthetic Plan    ASA: 3  Anesthesia type: general          Induction: Intravenous  Anesthetic plan and risks discussed with: Patient

## 2019-05-16 NOTE — Anesthesia Post-Procedure Evaluation (Signed)
Procedure(s):  DILATATION AND CURETTAGE HYSTEROSCOPY.    general    Anesthesia Post Evaluation      Multimodal analgesia: multimodal analgesia used between 6 hours prior to anesthesia start to PACU discharge  Patient location during evaluation: PACU  Patient participation: complete - patient participated  Level of consciousness: awake  Pain management: adequate  Airway patency: patent  Anesthetic complications: no  Cardiovascular status: acceptable  Respiratory status: acceptable  Hydration status: acceptable  Post anesthesia nausea and vomiting:  controlled      INITIAL Post-op Vital signs:   Vitals Value Taken Time   BP 141/68 05/16/2019  2:12 PM   Temp 36.8 ??C (98.2 ??F) 05/16/2019  2:14 PM   Pulse 85 05/16/2019  2:15 PM   Resp 16 05/16/2019  2:12 PM   SpO2 94 % 05/16/2019  2:15 PM   Vitals shown include unvalidated device data.

## 2019-05-16 NOTE — Op Note (Signed)
Surgical op note reoperative diagnosis is thickened endometrium on tamoxifen    Postoperative diagnosis is the same with uterine fibroids    Surgeon Elvenia Godden Rector, Jr., MD    Anesthesia General CA    Estimated fluid loss was 200 cc    What was done patient was taken the operating room and after satisfactory general anesthesia was obtained the patient was placed in dorsolithotomy position prepped and draped in usual fashion for hysteroscopy and D&C a weighted speculum was placed in the patient's vagina and a tenaculum  placed on the anterior lip of the uterine cervix.  At this point an attempt was made to dilate the cervix with the dilators on the table which was unsuccessful and therefore lacrimal duct probes were used.    Starting with the smallest lacrimal duct and coming up to the largest the cervix was dilated then using Pratt dilators the uterus was or the uterine cervix was again dilated up to a #16 dilator    At this point the hysteroscope was advanced through the cervical loss into the uterine cavity revealed revealing a uterine fibroid on the posterior wall of the uterus and using the MyoSure of the uterine fibroid was removed.    At this point the uterine cavity was curetted productive of a scant amount of tissue there was noted to be adequate hemostasis and the scope was reinserted through the cervical loss in the uterine cavity and it appeared that there had been an adequate sampling of the uterus    At this point the patient was taken out of the dorsolithotomy position and taken to the recovery room in satisfactory condition sponge and instrument counts were correct and Dr. Rector dictating operative report on Ariana Bishop.

## 2019-05-17 ENCOUNTER — Inpatient Hospital Stay: Admit: 2019-05-17 | Payer: Self-pay | Primary: Family Medicine

## 2019-08-02 ENCOUNTER — Telehealth: Payer: Self-pay | Admitting: *Deleted

## 2019-08-02 NOTE — Telephone Encounter (Signed)
Records faxed to [facility] - release [phone]

## 2019-08-30 ENCOUNTER — Ambulatory Visit: Attending: "Endocrinology | Primary: Family Medicine

## 2019-08-30 ENCOUNTER — Ambulatory Visit
Admit: 2019-08-30 | Discharge: 2019-08-30 | Payer: PRIVATE HEALTH INSURANCE | Attending: "Endocrinology | Primary: Family Medicine

## 2019-08-30 DIAGNOSIS — E039 Hypothyroidism, unspecified: Secondary | ICD-10-CM

## 2019-08-30 MED ORDER — LEVOTHYROXINE 100 MCG TAB
100 mcg | ORAL_TABLET | Freq: Every day | ORAL | 11 refills | Status: DC
Start: 2019-08-30 — End: 2020-01-01

## 2019-08-30 MED ORDER — LIOTHYRONINE 25 MCG TAB
25 mcg | ORAL_TABLET | Freq: Two times a day (BID) | ORAL | 11 refills | Status: DC
Start: 2019-08-30 — End: 2020-01-01

## 2019-08-30 MED ORDER — DEXAMETHASONE 1 MG TAB
1 mg | ORAL_TABLET | ORAL | 0 refills | Status: DC
Start: 2019-08-30 — End: 2020-01-01

## 2019-08-30 NOTE — Progress Notes (Signed)
Ariana Bishop R. [name], MD, [facility]  [address], [address]  [place], [place]  [address]  Phone [phone]  Facsimile [phone]          Ariana Bishop is a 48 y.o. female seen 08/30/2019 at the request of Dr. Wilford Grist for the evaluation of hypothyroidism      History of Present Illness:    THYROID DISEASE    Presentation/Diagnosis: Hypothyroidism diagnosed in her 76s.  She previously saw an endocrinologist in [place], [place].    Symptoms: Denies recent weight gain, fatigue, constipation, cold intolerance, hair loss, dry skin.  She has not had menses since 2015 after starting chemotherapy.  Denies palpitations, tremors.  She reports heat intolerance and hot flashes related to tamoxifen.    Treatment: Takes generic levothyroxine and liothyronine with other medications.    Imaging: None.    Labs:  08/02/2019: Estradiol 46, FSH 21.8.  08/25/2019: TSH 1.040.    Pregnancy status: No pregnancy plans.      Past Medical History:   Diagnosis Date   ??? Anxiety    ??? Arthritis    ??? Breast cancer (HCC)     Status post neoadjuvant chemotherapy, bilateral mastectomy and radiation (Dr. Carmela Hurt)   ??? Depression    ??? GERD (gastroesophageal reflux disease)    ??? Hypertension    ??? Insomnia    ??? Obesity    ??? Primary hypothyroidism        Past Surgical History:   Procedure Laterality Date   ??? HX BREAST RECONSTRUCTION      7 surgeries   ??? HX CESAREAN SECTION     ??? HX MASTECTOMY Bilateral        Allergies:  Imitrex [sumatriptan succinate], Amoxicillin, Bactrim [sulfamethoprim], Erythromycin, Keflex [cephalexin], Penicillins, Strawberry, Sulfa (sulfonamide antibiotics), and Zithromax [azithromycin]    Medications:  Reviewed in chart.    Social History     Socioeconomic History   ??? Marital status: MARRIED     Spouse name: Not on file   ??? Number of children: Not on file   ??? Years of education: Not on file   ??? Highest education level: Not on file   Occupational History   ??? Not on file   Social  Needs   ??? Financial resource strain: Not on file   ??? Food insecurity     Worry: Not on file     Inability: Not on file   ??? Transportation needs     Medical: Not on file     Non-medical: Not on file   Tobacco Use   ??? Smoking status: Never Smoker   ??? Smokeless tobacco: Never Used   Substance and Sexual Activity   ??? Alcohol use: Yes     Comment: wine  in the evening   ??? Drug use: No   ??? Sexual activity: Not on file   Lifestyle   ??? Physical activity     Days per week: Not on file     Minutes per session: Not on file   ??? Stress: Not on file   Relationships   ??? Social Wellsite geologist on phone: Not on file     Gets together: Not on file     Attends religious service: Not on file     Active member of club or organization: Not on file     Attends meetings of clubs or organizations: Not on file     Relationship status:  Not on file   ??? Intimate partner violence     Fear of current or ex partner: Not on file     Emotionally abused: Not on file     Physically abused: Not on file     Forced sexual activity: Not on file   Other Topics Concern   ??? Not on file   Social History Narrative   ??? Not on file       Family History   Problem Relation Age of Onset   ??? Thyroid Disease Mother         Hypothyroidism   ??? Breast Cancer Paternal Grandmother    ??? Diabetes Paternal Grandmother    ??? Thyroid Cancer Cousin        Review of Systems   Constitutional: Positive for diaphoresis. Negative for fatigue and unexpected weight change.   HENT: Negative for trouble swallowing and voice change.    Eyes: Negative for pain and visual disturbance.   Respiratory: Negative for cough, shortness of breath and wheezing.    Cardiovascular: Negative for chest pain, palpitations and leg swelling.   Gastrointestinal: Negative for abdominal pain, constipation, diarrhea, nausea and vomiting.   Endocrine: Positive for heat intolerance. Negative for cold intolerance, polydipsia, polyphagia and polyuria.   Genitourinary: Negative for difficulty urinating and  flank pain.        Menses were irregular, heavy and painful prior to cessation.   Musculoskeletal: Positive for arthralgias and back pain. Negative for myalgias.   Skin: Negative for rash.        +Mild hirsutism under chin for the past 20 years.   Neurological: Negative for dizziness, tremors, light-headedness, numbness and headaches.   Hematological: Negative for adenopathy.   Psychiatric/Behavioral: Negative for dysphoric mood and sleep disturbance. The patient is not nervous/anxious.        Vital Signs:  Visit Vitals  BP 124/86   Pulse 98   Ht 5\' 2"  (1.575 m)   Wt 244 lb (110.7 kg)   SpO2 95%   BMI 44.63 kg/m??       Physical Exam  Constitutional:       Appearance: She is well-developed. She is obese.   HENT:      Head: Normocephalic and atraumatic.   Eyes:      Conjunctiva/sclera: Conjunctivae normal.      Pupils: Pupils are equal, round, and reactive to light.      Comments: No proptosis   Neck:      Thyroid: No thyromegaly.      Trachea: No tracheal deviation.   Cardiovascular:      Rate and Rhythm: Normal rate and regular rhythm.      Heart sounds: No murmur.      Comments: No edema  Pulmonary:      Effort: Pulmonary effort is normal.      Breath sounds: Normal breath sounds.   Abdominal:      General: Bowel sounds are normal. There is no distension.      Palpations: Abdomen is soft. There is no mass.      Tenderness: There is no abdominal tenderness.   Musculoskeletal: Normal range of motion.   Lymphadenopathy:      Cervical: No cervical adenopathy.   Skin:     General: Skin is warm and dry.      Findings: No rash.      Comments: Mild hirsutism under her chin.   Neurological:      Mental Status: She is alert and  oriented to person, place, and time.      Motor: No tremor.      Deep Tendon Reflexes: Reflexes are normal and symmetric.         Assessment and Plan:    1. Primary hypothyroidism  She reports that her previous physician had difficulty stabilizing her thyroid function tests.  She is clinically  euthyroid.  Her most recent TSH was also normal 08/2019.  Since she is taking combination therapy with levothyroxine and liothyronine, I will assess her free T4 and free T3 levels today as well.  I will also check thyroid peroxidase antibodies.  Follow-up in 4 months.    - levothyroxine (Synthroid) 100 mcg tablet; Take 1 Tab by mouth Daily (before breakfast).  Dispense: 30 Tab; Refill: 11  - liothyronine (Cytomel) 25 mcg tablet; Take 0.5 Tabs by mouth two (2) times a day.  Dispense: 30 Tab; Refill: 11    2. Hirsutism  She has mild hirsutism.  She also reports a history of irregular menses.  I suspect that she likely has polycystic ovarian syndrome.  I will exclude androgen producing tumors and nonclassical congenital adrenal hyperplasia.  I will so perform a 1 mg overnight dexamethasone suppression test to screen for Cushing's syndrome.  Oral contraceptive pills are obviously contraindicated given her history of breast cancer.  Spironolactone may be a good option for her, especially given her history of chlorthalidone induced hypokalemia.        Orders Placed This Encounter   ??? Free T4   ??? T3, FREE   ??? Thyroid Peroxidase Antibody   ??? Free testosterone by MS/equilibrium dialysis   ??? DHEA Sulfate   ??? 17OH Progesterone   ??? CORTISOL     Standing Status:   Future     Standing Expiration Date:   08/29/2020   ??? DEXAMETHASONE     Standing Status:   Future     Standing Expiration Date:   08/29/2020   ??? TSH     Standing Status:   Future     Standing Expiration Date:   08/29/2020   ??? Free T4     Standing Status:   Future     Standing Expiration Date:   08/29/2020   ??? T3, FREE     Standing Status:   Future     Standing Expiration Date:   08/29/2020   ??? cyclobenzaprine (FLEXERIL) 10 mg tablet   ??? metoclopramide HCl (REGLAN) 10 mg tablet   ??? levothyroxine (Synthroid) 100 mcg tablet     Sig: Take 1 Tab by mouth Daily (before breakfast).     Dispense:  30 Tab     Refill:  11   ??? liothyronine (Cytomel) 25 mcg tablet     Sig: Take  0.5 Tabs by mouth two (2) times a day.     Dispense:  30 Tab     Refill:  11   ??? dexAMETHasone (DECADRON) 1 mg tablet     Sig: 1 tablet at 11 PM the night prior to labs     Dispense:  1 Tab     Refill:  0       Current Outpatient Medications   Medication Sig Dispense Refill   ??? cyclobenzaprine (FLEXERIL) 10 mg tablet      ??? metoclopramide HCl (REGLAN) 10 mg tablet      ??? levothyroxine (Synthroid) 100 mcg tablet Take 1 Tab by mouth Daily (before breakfast). 30 Tab 11   ??? liothyronine (Cytomel) 25 mcg  tablet Take 0.5 Tabs by mouth two (2) times a day. 30 Tab 11   ??? dexAMETHasone (DECADRON) 1 mg tablet 1 tablet at 11 PM the night prior to labs 1 Tab 0   ??? chlorthalidone (HYGROTEN) 25 mg tablet Take 25 mg by mouth daily.     ??? magnesium oxide (MAG-OX) 400 mg tablet Take 400 mg by mouth daily.     ??? potassium chloride SR (KLOR-CON 10) 10 mEq tablet Take 10 mEq by mouth daily.     ??? Dexlansoprazole (DEXILANT) 60 mg CpDB Take  by mouth daily.     ??? Cetirizine (ZYRTEC) 10 mg cap Take  by mouth nightly.     ??? escitalopram oxalate (LEXAPRO) 10 mg tablet Take 10 mg by mouth every evening.     ??? tamoxifen (NOLVADEX) 20 mg tablet Take 20 mg by mouth every evening.     ??? traZODone (DESYREL) 50 mg tablet Take 50 mg by mouth nightly.         Follow-up and Dispositions    ?? Return in about 4 months (around 12/29/2019).

## 2019-08-30 NOTE — Progress Notes (Signed)
Progress  Notes by Governor Rooks, MD at 08/30/19 1120                Author: Governor Rooks, MD  Service: --  Author Type: Physician       Filed: 08/30/19 1208  Encounter Date: 08/30/2019  Status: Signed          Editor: Governor Rooks, MD (Physician)                       [name], MD, [facility]   [address], [address]   [place], [place]  [address]   Phone [phone]   Facsimile [phone]               Ariana Bishop is a 48 y.o.  female seen 08/30/2019  at the request of Dr. Wilford Grist for the evaluation of hypothyroidism         History of Present Illness:      THYROID DISEASE      Presentation/Diagnosis: Hypothyroidism diagnosed in her 83s.  She previously saw an endocrinologist in [place], [place].      Symptoms: Denies recent weight gain, fatigue, constipation, cold intolerance, hair loss, dry skin.  She has not had menses since 2015 after  starting chemotherapy.  Denies palpitations, tremors.  She reports heat intolerance and hot flashes related to tamoxifen.      Treatment: Takes generic levothyroxine and liothyronine with other medications.      Imaging: None.      Labs:   08/02/2019: Estradiol 46, FSH 21.8.   08/25/2019: TSH 1.040.      Pregnancy status: No pregnancy plans.           Past Medical History:        Diagnosis  Date         ?  Anxiety       ?  Arthritis       ?  Breast cancer [facility])            Status post neoadjuvant chemotherapy, bilateral mastectomy and radiation (Dr. Carmela Hurt)         ?  Depression       ?  GERD (gastroesophageal reflux disease)       ?  Hypertension       ?  Insomnia       ?  Obesity           ?  Primary hypothyroidism               Past Surgical History:         Procedure  Laterality  Date          ?  HX BREAST RECONSTRUCTION              7 surgeries          ?  HX CESAREAN SECTION              ?  HX MASTECTOMY  Bilateral             Allergies:   Imitrex [sumatriptan succinate], Amoxicillin, Bactrim  [sulfamethoprim], Erythromycin, Keflex [cephalexin], Penicillins, Strawberry, Sulfa (sulfonamide antibiotics),  and Zithromax [azithromycin]      Medications:   Reviewed in chart.        Social History          Socioeconomic History         ?  Marital status:  MARRIED              Spouse name:  Not on file         ?  Number of children:  Not on file     ?  Years of education:  Not on file     ?  Highest education level:  Not on file       Occupational History        ?  Not on file       Social Needs         ?  Financial resource strain:  Not on file        ?  Food insecurity              Worry:  Not on file         Inability:  Not on file        ?  Transportation needs              Medical:  Not on file         Non-medical:  Not on file       Tobacco Use         ?  Smoking status:  Never Smoker     ?  Smokeless tobacco:  Never Used       Substance and Sexual Activity         ?  Alcohol use:  Yes             Comment: wine  in the evening         ?  Drug use:  No     ?  Sexual activity:  Not on file       Lifestyle        ?  Physical activity              Days per week:  Not on file         Minutes per session:  Not on file         ?  Stress:  Not on file       Relationships        ?  Social Engineer, manufacturing systems on phone:  Not on file         Gets together:  Not on file         Attends religious service:  Not on file              Active member of club or organization:  Not on file              Attends meetings of clubs or organizations:  Not on file         Relationship status:  Not on file        ?  Intimate partner violence              Fear of current or ex partner:  Not on file         Emotionally abused:  Not on file         Physically abused:  Not on file         Forced sexual activity:  Not on file        Other Topics  Concern        ?  Not on file       Social  History Narrative        ?  Not on file             Family History         Problem  Relation  Age of Onset          ?  Thyroid Disease  Mother                 Hypothyroidism          ?  Breast Cancer  Paternal Grandmother       ?  Diabetes  Paternal Grandmother            ?  Thyroid Cancer  Cousin             Review of Systems    Constitutional: Positive for diaphoresis. Negative for fatigue and unexpected weight change.    HENT: Negative for trouble swallowing and voice change.     Eyes: Negative for pain and visual disturbance.    Respiratory: Negative for cough, shortness of breath and wheezing.     Cardiovascular: Negative for chest pain, palpitations and leg swelling.    Gastrointestinal: Negative for abdominal pain, constipation, diarrhea, nausea and vomiting.    Endocrine: Positive for heat intolerance. Negative for cold intolerance, polydipsia, polyphagia and polyuria.    Genitourinary: Negative for difficulty urinating and flank pain.         Menses were irregular, heavy and painful prior to cessation.    Musculoskeletal: Positive for arthralgias and back pain . Negative for myalgias.    Skin: Negative for rash.         +Mild hirsutism under chin for the past 20 years.    Neurological: Negative for dizziness, tremors, light-headedness, numbness and headaches.    Hematological: Negative for adenopathy.    Psychiatric/Behavioral: Negative for dysphoric mood and sleep disturbance. The patient is not nervous/anxious.           Vital Signs:   Visit Vitals      BP  124/86     Pulse  98     Ht   (1.575 m)     Wt  244 lb (110.7 kg)     SpO2  95%        BMI  44.63 kg/m??           Physical Exam   Constitutional :        Appearance: She is well-developed. She is obese.   HENT:       Head: Normocephalic and atraumatic.   Eyes :       Conjunctiva/sclera: Conjunctivae normal.      Pupils: Pupils are equal, round, and reactive to light.      Comments: No proptosis    Neck :       Thyroid: No thyromegaly.      Trachea: No tracheal deviation.    Cardiovascular:       Rate and Rhythm: Normal rate and regular rhythm.      Heart sounds: No murmur.      Comments:  No edema  Pulmonary:       Effort: Pulmonary effort is normal.      Breath sounds: Normal breath sounds.   Abdominal :      General: Bowel sounds are normal. There is no distension.      Palpations: Abdomen is soft. There is no mass.      Tenderness: There is no abdominal tenderness.  Musculoskeletal: Normal range of motion.     Lymphadenopathy:       Cervical: No cervical adenopathy.   Skin :      General: Skin is warm and dry.      Findings: No rash.      Comments: Mild hirsutism under her chin.   Neurological:       Mental Status: She is alert and oriented to person, place, and time.      Motor: No tremor.      Deep Tendon Reflexes: Reflexes are normal and symmetric.            Assessment and Plan:      1. Primary hypothyroidism   She reports that her previous physician had difficulty stabilizing her thyroid function tests.  She is clinically euthyroid.  Her most recent TSH was also normal 08/2019.  Since she is taking combination therapy with levothyroxine and liothyronine, I  will assess her free T4 and free T3 levels today as well.  I will also check thyroid peroxidase antibodies.  Follow-up in 4 months.      - levothyroxine (Synthroid) 100 mcg tablet; Take 1 Tab by mouth Daily (before breakfast).  Dispense: 30 Tab; Refill: 11   - liothyronine (Cytomel) 25 mcg tablet; Take 0.5 Tabs by mouth two (2) times a day.  Dispense: 30 Tab; Refill: 11      2. Hirsutism   She has mild hirsutism.  She also reports a history of irregular menses.  I suspect that she likely has polycystic ovarian syndrome.  I will exclude androgen producing tumors and nonclassical congenital adrenal hyperplasia.  I will so perform a 1 mg overnight  dexamethasone suppression test to screen for Cushing's syndrome.  Oral contraceptive pills are obviously contraindicated given her history of breast cancer.  Spironolactone may be a good option for her, especially given her history of chlorthalidone induced  hypokalemia.              Orders  Placed This Encounter        ?  Free T4     ?  T3, FREE     ?  Thyroid Peroxidase Antibody     ?  Free testosterone by MS/equilibrium dialysis     ?  DHEA Sulfate     ?  17OH Progesterone     ?  CORTISOL              Standing Status:    Future         Standing Expiration Date:    08/29/2020        ?  DEXAMETHASONE              Standing Status:    Future         Standing Expiration Date:    08/29/2020        ?  TSH              Standing Status:    Future         Standing Expiration Date:    08/29/2020        ?  Free T4              Standing Status:    Future         Standing Expiration Date:    08/29/2020        ?  T3, FREE              Standing Status:  Future         Standing Expiration Date:    08/29/2020        ?  cyclobenzaprine (FLEXERIL) 10 mg tablet     ?  metoclopramide HCl (REGLAN) 10 mg tablet     ?  levothyroxine (Synthroid) 100 mcg tablet             Sig: Take 1 Tab by mouth Daily (before breakfast).         Dispense:  30 Tab         Refill:  11        ?  liothyronine (Cytomel) 25 mcg tablet             Sig: Take 0.5 Tabs by mouth two (2) times a day.         Dispense:  30 Tab         Refill:  11        ?  dexAMETHasone (DECADRON) 1 mg tablet             Sig: 1 tablet at 11 PM the night prior to labs         Dispense:  1 Tab             Refill:  0             Current Outpatient Medications          Medication  Sig  Dispense  Refill           ?  cyclobenzaprine (FLEXERIL) 10 mg tablet           ?  metoclopramide HCl (REGLAN) 10 mg tablet           ?  levothyroxine (Synthroid) 100 mcg tablet  Take 1 Tab by mouth Daily (before breakfast).  30 Tab  11     ?  liothyronine (Cytomel) 25 mcg tablet  Take 0.5 Tabs by mouth two (2) times a day.  30 Tab  11     ?  dexAMETHasone (DECADRON) 1 mg tablet  1 tablet at 11 PM the night prior to labs  1 Tab  0     ?  chlorthalidone (HYGROTEN) 25 mg tablet  Take 25 mg by mouth daily.         ?  magnesium oxide (MAG-OX) 400 mg tablet  Take 400 mg by mouth daily.         ?   potassium chloride SR (KLOR-CON 10) 10 mEq tablet  Take 10 mEq by mouth daily.         ?  Dexlansoprazole (DEXILANT) 60 mg CpDB  Take  by mouth daily.         ?  Cetirizine (ZYRTEC) 10 mg cap  Take  by mouth nightly.         ?  escitalopram oxalate (LEXAPRO) 10 mg tablet  Take 10 mg by mouth every evening.         ?  tamoxifen (NOLVADEX) 20 mg tablet  Take 20 mg by mouth every evening.               ?  traZODone (DESYREL) 50 mg tablet  Take 50 mg by mouth nightly.                 Follow-up and Dispositions      ??  Return in about 4 months (around 12/29/2019).

## 2019-09-04 ENCOUNTER — Other Ambulatory Visit: Admit: 2019-09-04 | Discharge: 2019-09-04 | Payer: PRIVATE HEALTH INSURANCE | Primary: Family Medicine

## 2019-09-04 DIAGNOSIS — L680 Hirsutism: Secondary | ICD-10-CM

## 2019-09-04 NOTE — Progress Notes (Signed)
No additional comment

## 2019-09-11 LAB — CORTISOL
Cortisol, random: 1 ug/dL
Cortisol: 1 ug/dL

## 2019-09-11 LAB — DEXAMETHASONE: Dexamethasone: 309 ng/dL

## 2019-09-30 LAB — T4, FREE
T4 Free: 0.98 ng/dL (ref 0.82–1.77)
T4, Free: 0.98 ng/dL (ref 0.82–1.77)

## 2019-09-30 LAB — THYROID PEROXIDASE (TPO) AB
Thyroid Peroxidase Antibody: 13 IU/mL (ref 0–34)
Thyroid peroxidase Ab: 13 IU/mL (ref 0–34)

## 2019-09-30 LAB — 17-OH PROGESTERONE LCMS
17-OH PROGESTERONE, 004714: 283 ng/dL
17-OH Progesterone: 283 ng/dL

## 2019-09-30 LAB — DHEA SULFATE
DHEA SULFATE,DHEAS: 138 ug/dL (ref 41.2–243.7)
DHEA Sulfate: 138 ug/dL (ref 41.2–243.7)

## 2019-09-30 LAB — T3, FREE
T3, Free: 4.6 pg/mL — ABNORMAL HIGH (ref 2.0–4.4)
Triiodothyronine (T3), free: 4.6 pg/mL — ABNORMAL HIGH (ref 2.0–4.4)

## 2019-09-30 LAB — TESTOSTERONE FREE MS/DIALYSIS
% Free Testosterone (Dialysis): 0.9 %
Free Testosterone, Serum: 2.6 pg/mL
Testosterone: 29 ng/dL

## 2019-12-27 ENCOUNTER — Encounter: Admit: 2019-12-27 | Discharge: 2019-12-27 | Payer: PRIVATE HEALTH INSURANCE | Primary: Family Medicine

## 2019-12-27 DIAGNOSIS — E039 Hypothyroidism, unspecified: Secondary | ICD-10-CM

## 2019-12-28 LAB — T3, FREE
T3, Free: 4.6 pg/mL — ABNORMAL HIGH (ref 2.0–4.4)
Triiodothyronine (T3), free: 4.6 pg/mL — ABNORMAL HIGH (ref 2.0–4.4)

## 2019-12-28 LAB — T4, FREE
T4 Free: 1.06 ng/dL (ref 0.82–1.77)
T4, Free: 1.06 ng/dL (ref 0.82–1.77)

## 2019-12-28 LAB — TSH 3RD GENERATION
TSH: 0.416 u[IU]/mL — ABNORMAL LOW (ref 0.450–4.500)
TSH: 0.416 u[IU]/mL — ABNORMAL LOW (ref 0.450–4.500)

## 2020-01-01 ENCOUNTER — Ambulatory Visit: Attending: "Endocrinology | Primary: Family Medicine

## 2020-01-01 ENCOUNTER — Ambulatory Visit
Admit: 2020-01-01 | Discharge: 2020-01-01 | Payer: PRIVATE HEALTH INSURANCE | Attending: "Endocrinology | Primary: Family Medicine

## 2020-01-01 DIAGNOSIS — E039 Hypothyroidism, unspecified: Secondary | ICD-10-CM

## 2020-01-01 MED ORDER — LIOTHYRONINE 5 MCG TAB
5 mcg | ORAL_TABLET | Freq: Two times a day (BID) | ORAL | 3 refills | Status: DC
Start: 2020-01-01 — End: 2020-07-03

## 2020-01-01 MED ORDER — LEVOTHYROXINE 100 MCG TAB
100 mcg | ORAL_TABLET | Freq: Every day | ORAL | 3 refills | Status: DC
Start: 2020-01-01 — End: 2020-07-03

## 2020-01-01 MED ORDER — SPIRONOLACTONE 50 MG TAB
50 mg | ORAL_TABLET | Freq: Every day | ORAL | 3 refills | Status: DC
Start: 2020-01-01 — End: 2020-07-03

## 2020-01-01 NOTE — Progress Notes (Signed)
Josuha Fontanez R. [name], MD, [facility]  [address], [address]  [place], [place]  [address]  Phone [phone]  Facsimile [phone]          Ariana Bishop is a 49 y.o. female seen 01/01/2020 for follow up of hypothyroidism        ASSESSMENT AND PLAN:    1. Primary hypothyroidism  She reports that her previous physician had difficulty stabilizing her thyroid function tests.  She is slightly over replaced and I will decrease her liothyronine as below.  Follow-up in 4 months.    - levothyroxine (Synthroid) 100 mcg tablet; Take 1 Tab by mouth Daily (before breakfast).  Dispense: 90 Tab; Refill: 3  - liothyronine (Cytomel) 5 mcg tablet; Take 2 Tabs by mouth two (2) times a day.  Dispense: 360 Tab; Refill: 3    2. Hirsutism  She has mild hirsutism.  She also reports a history of irregular menses.  I suspect that she likely has polycystic ovarian syndrome.  Androgen producing tumor, nonclassical congenital adrenal hyperplasia and Cushing's syndrome were excluded.  Oral contraceptive pills are obviously contraindicated given her history of breast cancer.  I will therefore start her on spironolactone as below.  I will have her hold her chlorthalidone and potassium for now.  She will follow-up with her primary care physician early next month to ensure that her blood pressure is well controlled.    - spironolactone (ALDACTONE) 50 mg tablet; Take 1 Tab by mouth daily.  Dispense: 90 Tab; Refill: 3      Follow-up and Dispositions    ?? Return in about 6 months (around 07/02/2020).         HISTORY OF PRESENT ILLNESS:    THYROID DISEASE  ??  Presentation/Diagnosis: Hypothyroidism diagnosed in her 75s.  She previously saw an endocrinologist in [place], [place].  ??  Symptoms: See review of systems.      Treatment: Takes generic levothyroxine and liothyronine with other medications.  ??  Imaging: None.  ??  Labs:  08/02/2019: Estradiol 46, FSH 21.8.  08/25/2019: TSH 1.040.  08/30/2019: Free  T4 0.98, free T3 4.6, thyroid peroxidase antibodies 13, total testosterone 29, free testosterone 2.6, DHEA-S 138.0, 17-OH progesterone 283.  09/04/2019: Cortisol 1.0, dexamethasone 309.  12/27/2019: TSH 0.416, free T4 1.06, free T3 4.6.  ??  Pregnancy status: No pregnancy plans.  ??    Review of Systems   Constitutional: Negative for diaphoresis, fatigue and unexpected weight change.   Cardiovascular: Negative for palpitations.   Gastrointestinal: Negative for constipation and diarrhea.   Endocrine: Negative for cold intolerance.        She reports heat intolerance and hot flashes related to tamoxifen.   Genitourinary:        She has not had menses since 2015 after starting chemotherapy.   Skin:        Denies hair loss, dry skin.  She reports hirsutism, primarily under her chin.    Neurological: Negative for tremors.       Vital Signs:  Visit Vitals  BP 118/70 (BP 1 Location: Left upper arm, BP Patient Position: Sitting, BP Cuff Size: Large adult)   Pulse (!) 107   Ht 5\' 2"  (1.575 m)   Wt 245 lb (111.1 kg)   SpO2 96%   BMI 44.81 kg/m??     Wt Readings from Last 3 Encounters:   01/01/20 245 lb (111.1 kg)   08/30/19 244 lb (110.7 kg)  05/16/19 242 lb (109.8 kg)       Physical Exam  Constitutional:       Appearance: She is well-developed.   Neck:      Thyroid: No thyromegaly.   Cardiovascular:      Rate and Rhythm: Normal rate and regular rhythm.      Heart sounds: No murmur.   Pulmonary:      Effort: Pulmonary effort is normal.      Breath sounds: Normal breath sounds.   Abdominal:      General: Bowel sounds are normal. There is no distension.      Palpations: Abdomen is soft.      Tenderness: There is no abdominal tenderness.   Lymphadenopathy:      Cervical: No cervical adenopathy.   Neurological:      Motor: No tremor.           Orders Placed This Encounter   ??? TSH     Standing Status:   Future     Standing Expiration Date:   12/31/2020   ??? Free T4     Standing Status:   Future     Standing Expiration Date:   12/31/2020    ??? T3, FREE     Standing Status:   Future     Standing Expiration Date:   12/31/2020   ??? Basic Metabolic Panel (BMP) (BMET)     Standing Status:   Future     Standing Expiration Date:   12/31/2020   ??? diclofenac EC (VOLTAREN) 75 mg EC tablet   ??? doxycycline (VIBRAMYCIN) 100 mg capsule   ??? oxybutynin chloride XL (DITROPAN XL) 10 mg CR tablet   ??? levothyroxine (Synthroid) 100 mcg tablet     Sig: Take 1 Tab by mouth Daily (before breakfast).     Dispense:  90 Tab     Refill:  3   ??? liothyronine (Cytomel) 5 mcg tablet     Sig: Take 2 Tabs by mouth two (2) times a day.     Dispense:  360 Tab     Refill:  3   ??? spironolactone (ALDACTONE) 50 mg tablet     Sig: Take 1 Tab by mouth daily.     Dispense:  90 Tab     Refill:  3       Current Outpatient Medications   Medication Sig Dispense Refill   ??? diclofenac EC (VOLTAREN) 75 mg EC tablet      ??? doxycycline (VIBRAMYCIN) 100 mg capsule      ??? oxybutynin chloride XL (DITROPAN XL) 10 mg CR tablet      ??? levothyroxine (Synthroid) 100 mcg tablet Take 1 Tab by mouth Daily (before breakfast). 90 Tab 3   ??? liothyronine (Cytomel) 5 mcg tablet Take 2 Tabs by mouth two (2) times a day. 360 Tab 3   ??? spironolactone (ALDACTONE) 50 mg tablet Take 1 Tab by mouth daily. 90 Tab 3   ??? cyclobenzaprine (FLEXERIL) 10 mg tablet      ??? metoclopramide HCl (REGLAN) 10 mg tablet      ??? magnesium oxide (MAG-OX) 400 mg tablet Take 400 mg by mouth daily.     ??? Cetirizine (ZYRTEC) 10 mg cap Take  by mouth nightly.     ??? escitalopram oxalate (LEXAPRO) 10 mg tablet Take 10 mg by mouth every evening.     ??? tamoxifen (NOLVADEX) 20 mg tablet Take 20 mg by mouth every evening.     ??? traZODone (  DESYREL) 50 mg tablet Take 50 mg by mouth nightly.

## 2020-01-01 NOTE — Progress Notes (Signed)
Progress  Notes by Ariana Bishop, Ariana Bishop at 01/01/20 0840                Author: Governor Bishop, Ariana Bishop  Service: --  Author Type: Physician       Filed: 01/01/20 0959  Encounter Date: 01/01/2020  Status: Signed          Editor: Ariana Bishop, Ariana Bishop (Physician)                       Ariana Inch. Ariana Bishop, Ariana Bishop, [facility]   [address], [address]   [place], [place]  [address]   Phone [phone]   Facsimile [phone]               Ariana Bishop is a 49 y.o.  female seen 01/01/2020 for follow up of hypothyroidism            ASSESSMENT AND PLAN:      1. Primary hypothyroidism   She reports that her previous physician had difficulty stabilizing her thyroid function tests.  She is slightly over replaced and I will decrease her liothyronine as below.  Follow-up in 4 months.      - levothyroxine (Synthroid) 100 mcg tablet; Take 1 Tab by mouth Daily (before breakfast).  Dispense: 90 Tab; Refill: 3   - liothyronine (Cytomel) 5 mcg tablet; Take 2 Tabs by mouth two (2) times a day.  Dispense: 360 Tab; Refill: 3      2. Hirsutism   She has mild hirsutism.  She also reports a history of irregular menses.  I suspect that she likely has polycystic ovarian syndrome.  Androgen producing tumor, nonclassical congenital adrenal hyperplasia and Cushing's syndrome were excluded.  Oral contraceptive  pills are obviously contraindicated given her history of breast cancer.  I will therefore start her on spironolactone as below.  I will have her hold her chlorthalidone and potassium for now.  She will follow-up with her primary care physician early next  month to ensure that her blood pressure is well controlled.      - spironolactone (ALDACTONE) 50 mg tablet; Take 1 Tab by mouth daily.  Dispense: 90 Tab; Refill: 3           Follow-up and Dispositions      ??  Return in about 6 months (around 07/02/2020).                HISTORY OF PRESENT ILLNESS:      THYROID DISEASE   ??   Presentation/Diagnosis:  Hypothyroidism diagnosed in her 35s.  She previously saw an endocrinologist in [place], [place].   ??   Symptoms: See review of systems.        Treatment: Takes generic levothyroxine and liothyronine with other medications.   ??   Imaging: None.   ??   Labs:   08/02/2019: Estradiol 46, FSH 21.8.   08/25/2019: TSH 1.040.   08/30/2019: Free T4 0.98, free T3 4.6, thyroid peroxidase antibodies 13, total testosterone 29, free testosterone 2.6, DHEA-S 138.0, 17-OH  progesterone 283.   09/04/2019: Cortisol 1.0, dexamethasone 309.   12/27/2019: TSH 0.416, free T4 1.06, free T3 4.6.   ??   Pregnancy status: No pregnancy plans.   ??      Review of Systems    Constitutional: Negative for diaphoresis, fatigue and unexpected weight change.    Cardiovascular: Negative for palpitations.    Gastrointestinal: Negative for  constipation and diarrhea.    Endocrine: Negative for cold intolerance.         She reports heat intolerance and hot flashes related to tamoxifen.    Genitourinary:         She has not had menses since 2015 after starting chemotherapy.    Skin:         Denies hair loss, dry skin.  She reports hirsutism, primarily under her chin.     Neurological: Negative for tremors.          Vital Signs:   Visit Vitals      BP  118/70 (BP 1 Location: Left upper arm, BP Patient Position: Sitting, BP Cuff Size: Large adult)     Pulse  (!) 107     Ht  5\' 2"  (1.575 m)     Wt  245 lb (111.1 kg)     SpO2  96%        BMI  44.81 kg/m??          Wt Readings from Last 3 Encounters:        01/01/20  245 lb (111.1 kg)     08/30/19  244 lb (110.7 kg)        05/16/19  242 lb (109.8 kg)           Physical Exam   Constitutional :        Appearance: She is well-developed.   Neck :       Thyroid: No thyromegaly.   Cardiovascular :       Rate and Rhythm: Normal rate and regular rhythm.      Heart sounds: No murmur.    Pulmonary:       Effort: Pulmonary effort is normal.      Breath sounds: Normal breath sounds.   Abdominal :      General: Bowel sounds are  normal. There is no distension.      Palpations: Abdomen is soft.      Tenderness: There is no abdominal tenderness.     Lymphadenopathy:       Cervical: No cervical adenopathy.   Neurological:       Motor: No tremor.                 Orders Placed This Encounter        ?  TSH              Standing Status:    Future         Standing Expiration Date:    12/31/2020        ?  Free T4              Standing Status:    Future         Standing Expiration Date:    12/31/2020        ?  T3, FREE              Standing Status:    Future         Standing Expiration Date:    12/31/2020        ?  Basic Metabolic Panel (BMP) (BMET)              Standing Status:    Future         Standing Expiration Date:    12/31/2020        ?  diclofenac EC (VOLTAREN) 75 mg EC tablet     ?  doxycycline (VIBRAMYCIN) 100 mg capsule     ?  oxybutynin chloride XL (DITROPAN XL) 10 mg CR tablet     ?  levothyroxine (Synthroid) 100 mcg tablet             Sig: Take 1 Tab by mouth Daily (before breakfast).         Dispense:  90 Tab         Refill:  3        ?  liothyronine (Cytomel) 5 mcg tablet             Sig: Take 2 Tabs by mouth two (2) times a day.         Dispense:  360 Tab         Refill:  3        ?  spironolactone (ALDACTONE) 50 mg tablet             Sig: Take 1 Tab by mouth daily.         Dispense:  90 Tab             Refill:  3             Current Outpatient Medications          Medication  Sig  Dispense  Refill           ?  diclofenac EC (VOLTAREN) 75 mg EC tablet           ?  doxycycline (VIBRAMYCIN) 100 mg capsule           ?  oxybutynin chloride XL (DITROPAN XL) 10 mg CR tablet           ?  levothyroxine (Synthroid) 100 mcg tablet  Take 1 Tab by mouth Daily (before breakfast).  90 Tab  3     ?  liothyronine (Cytomel) 5 mcg tablet  Take 2 Tabs by mouth two (2) times a day.  360 Tab  3     ?  spironolactone (ALDACTONE) 50 mg tablet  Take 1 Tab by mouth daily.  90 Tab  3     ?  cyclobenzaprine (FLEXERIL) 10 mg tablet           ?  metoclopramide HCl  (REGLAN) 10 mg tablet           ?  magnesium oxide (MAG-OX) 400 mg tablet  Take 400 mg by mouth daily.         ?  Cetirizine (ZYRTEC) 10 mg cap  Take  by mouth nightly.         ?  escitalopram oxalate (LEXAPRO) 10 mg tablet  Take 10 mg by mouth every evening.         ?  tamoxifen (NOLVADEX) 20 mg tablet  Take 20 mg by mouth every evening.               ?  traZODone (DESYREL) 50 mg tablet  Take 50 mg by mouth nightly.

## 2020-05-28 LAB — HEMOGLOBIN A1C
Estimated Avg Glucose, External: 148 mg/dL
Hemoglobin A1C, External: 6.8 % — ABNORMAL HIGH (ref <5.7)

## 2020-06-28 ENCOUNTER — Encounter: Primary: Family Medicine

## 2020-06-28 LAB — BASIC METABOLIC PANEL
BUN: 14 mg/dL (ref 6–24)
Bun/Cre Ratio: 18 NA (ref 9–23)
CO2: 28 mmol/L (ref 20–29)
Calcium: 9.6 mg/dL (ref 8.7–10.2)
Chloride: 94 mmol/L — ABNORMAL LOW (ref 96–106)
Creatinine: 0.76 mg/dL (ref 0.57–1.00)
EGFR IF NonAfrican American: 94 mL/min/{1.73_m2} (ref >59)
GFR African American: 109 mL/min/{1.73_m2} (ref >59)
Glucose: 187 mg/dL — ABNORMAL HIGH (ref 65–99)
Potassium: 4.5 mmol/L (ref 3.5–5.2)
Sodium: 138 mmol/L (ref 134–144)

## 2020-06-28 LAB — TSH 3RD GENERATION
TSH: 1.05 u[IU]/mL (ref 0.450–4.500)
TSH: 1.05 u[IU]/mL (ref 0.450–4.500)

## 2020-06-28 LAB — T3, FREE
T3, Free: 5.8 pg/mL — ABNORMAL HIGH (ref 2.0–4.4)
Triiodothyronine (T3), free: 5.8 pg/mL — ABNORMAL HIGH (ref 2.0–4.4)

## 2020-06-28 LAB — T4, FREE
T4 Free: 1.17 ng/dL (ref 0.82–1.77)
T4, Free: 1.17 ng/dL (ref 0.82–1.77)

## 2020-06-28 LAB — METABOLIC PANEL, BASIC
BUN/Creatinine ratio: 18 (ref 9–23)
BUN: 14 mg/dL (ref 6–24)
CO2: 28 mmol/L (ref 20–29)
Calcium: 9.6 mg/dL (ref 8.7–10.2)
Chloride: 94 mmol/L — ABNORMAL LOW (ref 96–106)
Creatinine: 0.76 mg/dL (ref 0.57–1.00)
GFR est AA: 109 mL/min/{1.73_m2} (ref >59)
GFR est non-AA: 94 mL/min/{1.73_m2} (ref >59)
Glucose: 187 mg/dL — ABNORMAL HIGH (ref 65–99)
Potassium: 4.5 mmol/L (ref 3.5–5.2)
Sodium: 138 mmol/L (ref 134–144)

## 2020-07-03 ENCOUNTER — Ambulatory Visit: Attending: "Endocrinology | Primary: Family Medicine

## 2020-07-03 ENCOUNTER — Ambulatory Visit
Admit: 2020-07-03 | Discharge: 2020-07-03 | Payer: PRIVATE HEALTH INSURANCE | Attending: "Endocrinology | Primary: Family Medicine

## 2020-07-03 DIAGNOSIS — E039 Hypothyroidism, unspecified: Secondary | ICD-10-CM

## 2020-07-03 MED ORDER — SPIRONOLACTONE 100 MG TAB
100 mg | ORAL_TABLET | Freq: Every day | ORAL | 3 refills | Status: DC
Start: 2020-07-03 — End: 2020-12-30

## 2020-07-03 MED ORDER — LEVOTHYROXINE 100 MCG TAB
100 mcg | ORAL_TABLET | Freq: Every day | ORAL | 3 refills | Status: DC
Start: 2020-07-03 — End: 2020-12-30

## 2020-07-03 MED ORDER — LIOTHYRONINE 5 MCG TAB
5 mcg | ORAL_TABLET | Freq: Two times a day (BID) | ORAL | 3 refills | Status: DC
Start: 2020-07-03 — End: 2020-12-30

## 2020-07-03 NOTE — Progress Notes (Signed)
Progress  Notes by Governor Rooks, Ariana Bishop at 07/03/20 1600                Author: Governor Rooks, Ariana Bishop  Service: --  Author Type: Physician       Filed: 07/03/20 1607  Encounter Date: 07/03/2020  Status: Signed          Editor: Governor Rooks, Ariana Bishop (Physician)                       Ariana Inch. Cliffton Ariana Bishop, Ariana Bishop, Ariana Bishop   [address], [address]   [place], [place]  [address]   Phone [phone]   Facsimile [phone]               Ariana Bishop is a 49 y.o.  female seen 07/03/2020 for follow up of hypothyroidism            ASSESSMENT AND PLAN:      1. Primary hypothyroidism   She reports that her previous physician had difficulty stabilizing her thyroid function tests.  She is clinically and biochemically euthyroid.  Follow-up in 6 months.      - levothyroxine (Synthroid) 100 mcg tablet; Take 1 Tab by mouth Daily (before breakfast).  Dispense: 90 Tab; Refill: 3   - liothyronine (Cytomel) 5 mcg tablet; Take 2 Tabs by mouth two (2) times a day.  Dispense: 360 Tab; Refill: 3      2. Hirsutism   She has mild hirsutism.  She also reports a history of irregular menses.  I suspect that she likely has polycystic ovarian syndrome.  Androgen producing tumor, nonclassical congenital adrenal hyperplasia and Cushing's syndrome were excluded.  Oral contraceptive  pills are obviously contraindicated given her history of breast cancer.  She has had mild improvement in her hirsutism since starting spironolactone and I will increase the dose as below.  I will have her return in 2 weeks to check her serum potassium  level.        - spironolactone (ALDACTONE) 100 mg tablet; Take 1 Tab by mouth daily.  Dispense: 90 Tab; Refill: 3           Follow-up and Dispositions      ??  Return in about 6 months (around 01/01/2021).                HISTORY OF PRESENT ILLNESS:      THYROID DISEASE   ??   Presentation/Diagnosis: Hypothyroidism diagnosed in her 52s.  She previously saw an endocrinologist in  [place], [place].   ??   Symptoms: See review of systems.        Treatment: Takes generic levothyroxine and liothyronine with other medications.   ??   Imaging: None.   ??   Labs:   08/02/2019: Estradiol 46, FSH 21.8.   08/25/2019: TSH 1.040.   08/30/2019: Free T4 0.98, free T3 4.6, thyroid peroxidase antibodies 13, total testosterone 29, free testosterone 2.6, DHEA-S 138.0, 17-OH  progesterone 283.   09/04/2019: Cortisol 1.0, dexamethasone 309.   12/27/2019: TSH 0.416, free T4 1.06, free T3 4.6.   06/27/2020: TSH 1.050, free T4 1.17, free T3 5.8, potassium 4.5, creatinine 0.76 (GFR 94).   ??   Pregnancy status: No pregnancy plans.   ??      Review of Systems    Constitutional: Positive for fatigue (improving following COVID last month) . Negative for diaphoresis.  Weight decreased 4 pounds since last visit.    Cardiovascular: Negative for palpitations.    Gastrointestinal: Negative for constipation and diarrhea.    Endocrine: Negative for cold intolerance.         She reports hot flashes related to tamoxifen.    Genitourinary:         She has not had menses since 2015 after starting chemotherapy.    Ariana:         Denies hair loss, dry Ariana.  She reports hirsutism, primarily under her chin.  She has had a slight improvement since starting spironolactone.     Neurological: Negative for tremors.          Vital Signs:   Visit Vitals      BP  138/70     Pulse  78     Ht  5\' 2"  (1.575 m)     Wt  241 lb 9.6 oz (109.6 kg)     SpO2  100%        BMI  44.19 kg/m??          Wt Readings from Last 3 Encounters:        07/03/20  241 lb 9.6 oz (109.6 kg)     01/01/20  245 lb (111.1 kg)        08/30/19  244 lb (110.7 kg)           Physical Exam   Constitutional :        Appearance: She is well-developed.   Neck :       Thyroid: No thyromegaly.   Cardiovascular :       Rate and Rhythm: Normal rate and regular rhythm.      Heart sounds: No murmur heard.      Pulmonary:       Effort: Pulmonary effort is normal.      Breath sounds:  Normal breath sounds.   Abdominal :      General: Bowel sounds are normal. There is no distension.      Palpations: Abdomen is soft.      Tenderness: There is no abdominal tenderness.     Lymphadenopathy:       Cervical: No cervical adenopathy.   Neurological:       Motor: No tremor.                 Orders Placed This Encounter        ?  Basic Metabolic Panel (BMP) (BMET)              Standing Status:    Future         Standing Expiration Date:    01/01/2021        ?  TSH              Standing Status:    Future         Standing Expiration Date:    07/03/2021        ?  Free T4              Standing Status:    Future         Standing Expiration Date:    07/03/2021        ?  T3, FREE              Standing Status:    Future         Standing Expiration Date:    07/03/2021        ?  Basic Metabolic Panel (BMP) (BMET)              Standing Status:    Future         Standing Expiration Date:    07/03/2021        ?  amLODIPine (NORVASC) 5 mg tablet             Sig: Take 5 mg by mouth daily.        ?  hydroCHLOROthiazide (MICROZIDE) 12.5 mg capsule             Sig: Take 12.5 mg by mouth daily.        ?  hyoscyamine SL (LEVSIN/SL) 0.125 mg SL tablet             Sig: 0.125 mg by SubLINGual route three (3) times daily as needed.        ?  pantoprazole (PROTONIX) 40 mg tablet             Sig: Take 40 mg by mouth daily.        ?  levothyroxine (Synthroid) 100 mcg tablet             Sig: Take 1 Tablet by mouth Daily (before breakfast).         Dispense:  90 Tablet         Refill:  3        ?  liothyronine (Cytomel) 5 mcg tablet             Sig: Take 2 Tablets by mouth two (2) times a day.         Dispense:  360 Tablet         Refill:  3        ?  spironolactone (ALDACTONE) 100 mg tablet             Sig: Take 1 Tablet by mouth daily.         Dispense:  90 Tablet             Refill:  3             Current Outpatient Medications          Medication  Sig  Dispense  Refill           ?  amLODIPine (NORVASC) 5 mg tablet  Take 5 mg by mouth  daily.         ?  hydroCHLOROthiazide (MICROZIDE) 12.5 mg capsule  Take 12.5 mg by mouth daily.         ?  hyoscyamine SL (LEVSIN/SL) 0.125 mg SL tablet  0.125 mg by SubLINGual route three (3) times daily as needed.         ?  pantoprazole (PROTONIX) 40 mg tablet  Take 40 mg by mouth daily.         ?  levothyroxine (Synthroid) 100 mcg tablet  Take 1 Tablet by mouth Daily (before breakfast).  90 Tablet  3     ?  liothyronine (Cytomel) 5 mcg tablet  Take 2 Tablets by mouth two (2) times a day.  360 Tablet  3     ?  spironolactone (ALDACTONE) 100 mg tablet  Take 1 Tablet by mouth daily.  90 Tablet  3     ?  diclofenac EC (VOLTAREN) 75 mg EC tablet           ?  doxycycline (VIBRAMYCIN) 100 mg capsule           ?  oxybutynin chloride XL (DITROPAN XL) 10 mg CR tablet           ?  cyclobenzaprine (FLEXERIL) 10 mg tablet           ?  metoclopramide HCl (REGLAN) 10 mg tablet           ?  magnesium oxide (MAG-OX) 400 mg tablet  Take 400 mg by mouth daily.         ?  Cetirizine (ZYRTEC) 10 mg cap  Take  by mouth nightly.         ?  escitalopram oxalate (LEXAPRO) 10 mg tablet  Take 10 mg by mouth every evening.         ?  tamoxifen (NOLVADEX) 20 mg tablet  Take 20 mg by mouth every evening.               ?  traZODone (DESYREL) 50 mg tablet  Take 50 mg by mouth nightly.

## 2020-07-16 ENCOUNTER — Encounter: Admit: 2020-07-16 | Discharge: 2020-07-16 | Payer: PRIVATE HEALTH INSURANCE | Primary: Family Medicine

## 2020-07-16 DIAGNOSIS — L680 Hirsutism: Secondary | ICD-10-CM

## 2020-07-17 ENCOUNTER — Telehealth

## 2020-07-17 LAB — BASIC METABOLIC PANEL
BUN: 18 mg/dL (ref 6–24)
Bun/Cre Ratio: 20 NA (ref 9–23)
CO2: 26 mmol/L (ref 20–29)
Calcium: 9.7 mg/dL (ref 8.7–10.2)
Chloride: 96 mmol/L (ref 96–106)
Creatinine: 0.89 mg/dL (ref 0.57–1.00)
EGFR IF NonAfrican American: 78 mL/min/{1.73_m2} (ref >59)
GFR African American: 90 mL/min/{1.73_m2} (ref >59)
Glucose: 237 mg/dL — ABNORMAL HIGH (ref 65–99)
Potassium: 4.3 mmol/L (ref 3.5–5.2)
Sodium: 137 mmol/L (ref 134–144)

## 2020-07-17 LAB — METABOLIC PANEL, BASIC
BUN/Creatinine ratio: 20 (ref 9–23)
BUN: 18 mg/dL (ref 6–24)
CO2: 26 mmol/L (ref 20–29)
Calcium: 9.7 mg/dL (ref 8.7–10.2)
Chloride: 96 mmol/L (ref 96–106)
Creatinine: 0.89 mg/dL (ref 0.57–1.00)
GFR est AA: 90 mL/min/{1.73_m2} (ref >59)
GFR est non-AA: 78 mL/min/{1.73_m2} (ref >59)
Glucose: 237 mg/dL — ABNORMAL HIGH (ref 65–99)
Potassium: 4.3 mmol/L (ref 3.5–5.2)
Sodium: 137 mmol/L (ref 134–144)

## 2020-07-17 NOTE — Telephone Encounter (Signed)
Her potassium level was normal, but her blood glucose was greater than 200.  She needs to come and get fasting labs drawn.  Order placed.

## 2020-07-17 NOTE — Telephone Encounter (Signed)
Patient informed of lab results and was scheduled for fasting lab appointment.

## 2020-07-25 ENCOUNTER — Encounter: Admit: 2020-07-25 | Discharge: 2020-07-25 | Payer: PRIVATE HEALTH INSURANCE | Primary: Family Medicine

## 2020-07-26 ENCOUNTER — Telehealth

## 2020-07-26 LAB — COMPREHENSIVE METABOLIC PANEL
ALT: 33 IU/L — ABNORMAL HIGH (ref 0–32)
AST: 38 IU/L (ref 0–40)
Albumin/Globulin Ratio: 1.4 NA (ref 1.2–2.2)
Albumin: 4.2 g/dL (ref 3.8–4.8)
Alkaline Phosphatase: 93 IU/L (ref 44–121)
BUN: 18 mg/dL (ref 6–24)
Bun/Cre Ratio: 22 NA (ref 9–23)
CO2: 24 mmol/L (ref 20–29)
Calcium: 9.3 mg/dL (ref 8.7–10.2)
Chloride: 96 mmol/L (ref 96–106)
Creatinine: 0.82 mg/dL (ref 0.57–1.00)
EGFR IF NonAfrican American: 86 mL/min/{1.73_m2} (ref >59)
GFR African American: 99 mL/min/{1.73_m2} (ref >59)
Globulin, Total: 2.9 g/dL (ref 1.5–4.5)
Glucose: 190 mg/dL — ABNORMAL HIGH (ref 65–99)
Potassium: 4.9 mmol/L (ref 3.5–5.2)
Sodium: 137 mmol/L (ref 134–144)
Total Bilirubin: 0.4 mg/dL (ref 0.0–1.2)
Total Protein: 7.1 g/dL (ref 6.0–8.5)

## 2020-07-26 LAB — LIPID PANEL
Cholesterol, Total: 204 mg/dL — ABNORMAL HIGH (ref 100–199)
Cholesterol, total: 204 mg/dL — ABNORMAL HIGH (ref 100–199)
HDL Cholesterol: 45 mg/dL (ref >39)
HDL: 45 mg/dL (ref >39)
LDL Calculated: 133 mg/dL — ABNORMAL HIGH (ref 0–99)
LDL, calculated: 133 mg/dL — ABNORMAL HIGH (ref 0–99)
Triglyceride: 145 mg/dL (ref 0–149)
Triglycerides: 145 mg/dL (ref 0–149)
VLDL, calculated: 26 mg/dL (ref 5–40)
VLDL: 26 mg/dL (ref 5–40)

## 2020-07-26 LAB — METABOLIC PANEL, COMPREHENSIVE
A-G Ratio: 1.4 (ref 1.2–2.2)
ALT (SGPT): 33 IU/L — ABNORMAL HIGH (ref 0–32)
AST (SGOT): 38 IU/L (ref 0–40)
Albumin: 4.2 g/dL (ref 3.8–4.8)
Alk. phosphatase: 93 IU/L (ref 44–121)
BUN/Creatinine ratio: 22 (ref 9–23)
BUN: 18 mg/dL (ref 6–24)
Bilirubin, total: 0.4 mg/dL (ref 0.0–1.2)
CO2: 24 mmol/L (ref 20–29)
Calcium: 9.3 mg/dL (ref 8.7–10.2)
Chloride: 96 mmol/L (ref 96–106)
Creatinine: 0.82 mg/dL (ref 0.57–1.00)
GFR est AA: 99 mL/min/{1.73_m2} (ref >59)
GFR est non-AA: 86 mL/min/{1.73_m2} (ref >59)
GLOBULIN, TOTAL: 2.9 g/dL (ref 1.5–4.5)
Glucose: 190 mg/dL — ABNORMAL HIGH (ref 65–99)
Potassium: 4.9 mmol/L (ref 3.5–5.2)
Protein, total: 7.1 g/dL (ref 6.0–8.5)
Sodium: 137 mmol/L (ref 134–144)

## 2020-07-26 MED ORDER — METFORMIN SR 500 MG 24 HR TABLET
500 mg | ORAL_TABLET | Freq: Two times a day (BID) | ORAL | 3 refills | Status: AC
Start: 2020-07-26 — End: ?

## 2020-07-26 NOTE — Telephone Encounter (Signed)
Her fasting blood glucose was elevated and consistent with type 2 diabetes mellitus.  I ordered a hemoglobin A1c as well but the lab did not run it.  I will see if the lab can add it on to the blood they drew.  I would recommend that she start metformin for her newly diagnosed diabetes.

## 2020-07-31 NOTE — Telephone Encounter (Signed)
Left detailed message on lab results on personal voicemail. Also let her know that she needs to have a Hemoglobin A1c done and to call our office to set that up with our lab. Left our call back number if she has any further questions.

## 2020-09-11 LAB — EXTERNAL UNMAPPED PROCEDURES: Hemoglobin A1C, External: 7 % — ABNORMAL HIGH (ref <5.7)

## 2020-12-11 LAB — HEMOGLOBIN A1C
Estimated Avg Glucose, External: 131 mg/dL
Hemoglobin A1C, External: 6.2 % — ABNORMAL HIGH (ref <5.7)

## 2020-12-24 ENCOUNTER — Encounter: Admit: 2020-12-24 | Discharge: 2020-12-24 | Payer: PRIVATE HEALTH INSURANCE | Primary: Family Medicine

## 2020-12-24 DIAGNOSIS — L680 Hirsutism: Secondary | ICD-10-CM

## 2020-12-25 LAB — T4, FREE
T4 Free: 1.06 ng/dL (ref 0.82–1.77)
T4, Free: 1.06 ng/dL (ref 0.82–1.77)

## 2020-12-25 LAB — BASIC METABOLIC PANEL
BUN: 32 mg/dL — ABNORMAL HIGH (ref 6–24)
Bun/Cre Ratio: 23 NA (ref 9–23)
CO2: 22 mmol/L (ref 20–29)
Calcium: 9.1 mg/dL (ref 8.7–10.2)
Chloride: 98 mmol/L (ref 96–106)
Creatinine: 1.37 mg/dL — ABNORMAL HIGH (ref 0.57–1.00)
Est, Glomerular Filtration Rate: 48 mL/min/{1.73_m2} — ABNORMAL LOW (ref >59)
Glucose: 232 mg/dL — ABNORMAL HIGH (ref 65–99)
Potassium: 5.2 mmol/L (ref 3.5–5.2)
Sodium: 139 mmol/L (ref 134–144)

## 2020-12-25 LAB — T3, FREE
T3, Free: 4.2 pg/mL (ref 2.0–4.4)
Triiodothyronine (T3), free: 4.2 pg/mL (ref 2.0–4.4)

## 2020-12-25 LAB — TSH 3RD GENERATION
TSH: 0.258 u[IU]/mL — ABNORMAL LOW (ref 0.450–4.500)
TSH: 0.258 u[IU]/mL — ABNORMAL LOW (ref 0.450–4.500)

## 2020-12-25 LAB — METABOLIC PANEL, BASIC
BUN/Creatinine ratio: 23 (ref 9–23)
BUN: 32 mg/dL — ABNORMAL HIGH (ref 6–24)
CO2: 22 mmol/L (ref 20–29)
Calcium: 9.1 mg/dL (ref 8.7–10.2)
Chloride: 98 mmol/L (ref 96–106)
Creatinine: 1.37 mg/dL — ABNORMAL HIGH (ref 0.57–1.00)
Glucose: 232 mg/dL — ABNORMAL HIGH (ref 65–99)
Potassium: 5.2 mmol/L (ref 3.5–5.2)
Sodium: 139 mmol/L (ref 134–144)
eGFR: 48 mL/min/{1.73_m2} — ABNORMAL LOW (ref >59)

## 2020-12-30 ENCOUNTER — Ambulatory Visit: Attending: "Endocrinology | Primary: Family Medicine

## 2020-12-30 ENCOUNTER — Ambulatory Visit
Admit: 2020-12-30 | Discharge: 2020-12-30 | Payer: PRIVATE HEALTH INSURANCE | Attending: "Endocrinology | Primary: Family Medicine

## 2020-12-30 DIAGNOSIS — E039 Hypothyroidism, unspecified: Secondary | ICD-10-CM

## 2020-12-30 MED ORDER — LEVOTHYROXINE 100 MCG TAB
100 mcg | ORAL_TABLET | Freq: Every day | ORAL | 3 refills | Status: AC
Start: 2020-12-30 — End: ?

## 2020-12-30 MED ORDER — SPIRONOLACTONE 50 MG TAB
50 mg | ORAL_TABLET | Freq: Every day | ORAL | 3 refills | Status: AC
Start: 2020-12-30 — End: ?

## 2020-12-30 MED ORDER — LIOTHYRONINE 5 MCG TAB
5 mcg | ORAL_TABLET | Freq: Two times a day (BID) | ORAL | 3 refills | Status: AC
Start: 2020-12-30 — End: ?

## 2020-12-30 NOTE — Progress Notes (Signed)
Progress  Notes by Governor Rooks, MD at 12/30/20 0900                Author: Governor Rooks, MD  Service: --  Author Type: Physician       Filed: 12/30/20 0927  Encounter Date: 12/30/2020  Status: Signed          Editor: Governor Rooks, MD (Physician)                       [name], MD, [facility]   [address]   [place], [place]  [address]   Phone [phone]   Facsimile [phone]               Ariana Bishop is a 50 y.o.  female seen 12/30/2020 for follow up of hypothyroidism            ASSESSMENT AND PLAN:      1. Primary hypothyroidism   She reports that her previous physician had difficulty stabilizing her thyroid function tests.  She is currently slightly over replaced, but she is not having any thyrotoxic symptoms.  I am therefore inclined to continue her current dose of levothyroxine  and liothyronine for now.  Follow-up in 6 months.      - levothyroxine (Synthroid) 100 mcg tablet; Take 1 Tab by mouth Daily (before breakfast).  Dispense: 90 Tab; Refill: 3   - liothyronine (Cytomel) 5 mcg tablet; Take 2 Tabs by mouth two (2) times a day.  Dispense: 360 Tab; Refill: 3      2. Hirsutism   She has mild hirsutism.  She also reports a history of irregular menses.  I suspect that she likely has polycystic ovarian syndrome.  Androgen producing tumor, nonclassical congenital adrenal hyperplasia and Cushing's syndrome were excluded.  Oral contraceptive  pills are obviously contraindicated given her history of breast cancer.  She has had mild improvement in her hirsutism on spironolactone 100 mg daily.  She reports that her primary care physician decreased her spironolactone to 50 mg daily due to hypotension.   If her hirsutism worsens, that it may be reasonable to increase her spironolactone and stop one of her other antihypertensives.      - spironolactone (ALDACTONE) 50 mg tablet; Take 1 Tab by mouth daily.  Dispense: 90 Tab; Refill: 3            Follow-up and Dispositions      ??  Return in about 6 months (around 07/01/2021).                HISTORY OF PRESENT ILLNESS:      THYROID DISEASE   ??   Presentation/Diagnosis: Hypothyroidism diagnosed in her 1s.  She previously saw an endocrinologist in [place], [place].   ??   Symptoms: See review of systems.        Treatment: Takes generic levothyroxine and liothyronine with other medications.   ??   Imaging: None.   ??   Labs:   08/02/2019: Estradiol 46, FSH 21.8.   08/25/2019: TSH 1.040.   08/30/2019: Free T4 0.98, free T3 4.6, thyroid peroxidase antibodies 13, total testosterone 29, free testosterone 2.6, DHEA-S 138.0, 17-OH  progesterone 283.   09/04/2019: Cortisol 1.0, dexamethasone 309.   12/27/2019: TSH 0.416, free T4 1.06, free T3 4.6.   06/27/2020: TSH 1.050, free T4 1.17, free T3 5.8, potassium 4.5, creatinine 0.76 (GFR 94).  07/25/2020: Glucose 190, creatinine 0.82 (GFR 86), total cholesterol 204, triglycerides 145, HDL 45, LDL 133, VLDL 26.   12/11/2020: Hemoglobin A1c 6.2, potassium 5.7, glucose 172, creatinine 1.64 (GFR 37), urine microalbumin/creatinine 78.   12/24/2020: TSH 0.258, free T4 1.06, free T3 4.2, potassium 5.2, glucose 232, creatinine 1.37 (GFR 48).   ??   Pregnancy status: No pregnancy plans.   ??      Review of Systems    Constitutional: Negative for diaphoresis and fatigue.         Weight decreased 6 pounds since last visit.    Cardiovascular: Negative for palpitations.    Gastrointestinal: Negative for constipation and diarrhea.    Endocrine: Negative for cold intolerance.         She reports hot flashes related to tamoxifen.    Genitourinary:         She has not had menses since 2015 after starting chemotherapy.    Skin:         Denies hair loss, dry skin.  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  She had a mild improvement on the higher dose of spironolactone 100 mg daily but her PCP just decreased  her dose to 50 mg daily because her BP was too low.    Neurological:  Negative for tremors.          Vital Signs:   Visit Vitals      BP  122/80     Pulse  85     Ht  5\' 2"  (1.575 m)     Wt  235 lb (106.6 kg)     SpO2  94%        BMI  42.98 kg/m??          Wt Readings from Last 3 Encounters:        12/30/20  235 lb (106.6 kg)     07/03/20  241 lb 9.6 oz (109.6 kg)        01/01/20  245 lb (111.1 kg)           Physical Exam   Constitutional :        Appearance: She is well-developed.   Neck :       Thyroid: No thyromegaly.   Cardiovascular :       Rate and Rhythm: Normal rate and regular rhythm.      Heart sounds: No murmur heard.        Pulmonary:       Effort: Pulmonary effort is normal.      Breath sounds: Normal breath sounds.   Abdominal :      General: Bowel sounds are normal. There is no distension.      Palpations: Abdomen is soft.      Tenderness: There is no abdominal tenderness.     Lymphadenopathy:       Cervical: No cervical adenopathy.   Neurological:       Motor: No tremor.                 Orders Placed This Encounter        ?  TSH              Standing Status:    Future         Standing Expiration Date:    12/30/2021        ?  Free T4              Standing Status:  Future         Standing Expiration Date:    12/30/2021        ?  T3, FREE              Standing Status:    Future         Standing Expiration Date:    12/30/2021        ?  Basic Metabolic Panel (BMP) (BMET)              Standing Status:    Future         Standing Expiration Date:    12/30/2021        ?  lisinopriL (PRINIVIL, ZESTRIL) 5 mg tablet     ?  levothyroxine (Synthroid) 100 mcg tablet             Sig: Take 1 Tablet by mouth Daily (before breakfast).         Dispense:  90 Tablet         Refill:  3        ?  liothyronine (Cytomel) 5 mcg tablet             Sig: Take 2 Tablets by mouth two (2) times a day.         Dispense:  360 Tablet         Refill:  3        ?  spironolactone (ALDACTONE) 50 mg tablet             Sig: Take 1 Tablet by mouth daily.         Dispense:  90 Tablet             Refill:  3              Current Outpatient Medications          Medication  Sig  Dispense  Refill           ?  lisinopriL (PRINIVIL, ZESTRIL) 5 mg tablet           ?  levothyroxine (Synthroid) 100 mcg tablet  Take 1 Tablet by mouth Daily (before breakfast).  90 Tablet  3     ?  liothyronine (Cytomel) 5 mcg tablet  Take 2 Tablets by mouth two (2) times a day.  360 Tablet  3     ?  spironolactone (ALDACTONE) 50 mg tablet  Take 1 Tablet by mouth daily.  90 Tablet  3     ?  metFORMIN ER (GLUCOPHAGE XR) 500 mg tablet  Take 1 Tablet by mouth two (2) times daily (with meals).  180 Tablet  3     ?  hydroCHLOROthiazide (MICROZIDE) 12.5 mg capsule  Take 12.5 mg by mouth daily.         ?  hyoscyamine SL (LEVSIN/SL) 0.125 mg SL tablet  0.125 mg by SubLINGual route three (3) times daily as needed.         ?  pantoprazole (PROTONIX) 40 mg tablet  Take 40 mg by mouth daily.         ?  diclofenac EC (VOLTAREN) 75 mg EC tablet           ?  oxybutynin chloride XL (DITROPAN XL) 10 mg CR tablet           ?  cyclobenzaprine (FLEXERIL) 10 mg tablet           ?  metoclopramide HCl (REGLAN) 10  mg tablet           ?  Cetirizine (ZYRTEC) 10 mg cap  Take  by mouth nightly.         ?  escitalopram oxalate (LEXAPRO) 10 mg tablet  Take 10 mg by mouth every evening.         ?  tamoxifen (NOLVADEX) 20 mg tablet  Take 20 mg by mouth every evening.               ?  traZODone (DESYREL) 50 mg tablet  Take 50 mg by mouth nightly.

## 2020-12-31 ENCOUNTER — Encounter: Attending: "Endocrinology | Primary: Family Medicine

## 2021-02-18 ENCOUNTER — Encounter

## 2021-04-18 LAB — HEMOGLOBIN A1C
Estimated Avg Glucose, External: 146 mg/dL
Hemoglobin A1C, External: 6.7 % — ABNORMAL HIGH (ref 4.8–5.6)

## 2021-07-02 ENCOUNTER — Ambulatory Visit
Admit: 2021-07-02 | Discharge: 2021-07-02 | Payer: PRIVATE HEALTH INSURANCE | Attending: "Endocrinology | Primary: Family Medicine

## 2021-07-02 ENCOUNTER — Encounter: Attending: "Endocrinology | Primary: Family Medicine

## 2021-07-02 DIAGNOSIS — E039 Hypothyroidism, unspecified: Secondary | ICD-10-CM

## 2021-07-02 MED ORDER — LEVOTHYROXINE SODIUM 100 MCG PO TABS
100 MCG | ORAL_TABLET | Freq: Every day | ORAL | 3 refills | Status: DC
Start: 2021-07-02 — End: 2021-12-31

## 2021-07-02 MED ORDER — SPIRONOLACTONE 50 MG PO TABS
50 MG | ORAL_TABLET | Freq: Every day | ORAL | 3 refills | Status: AC
Start: 2021-07-02 — End: 2021-12-31

## 2021-07-02 MED ORDER — LIOTHYRONINE SODIUM 5 MCG PO TABS
5 MCG | ORAL_TABLET | Freq: Two times a day (BID) | ORAL | 3 refills | Status: DC
Start: 2021-07-02 — End: 2021-12-31

## 2021-07-02 NOTE — Progress Notes (Signed)
Ariana Hannen R. [name], MD, [facility]  [address]  [place], [place]  [address]  Phone [phone]  Facsimile [phone]          Ariana Bishop is a 50 y.o. female seen 07/02/2021 for follow up of hypothyroidism        ASSESSMENT AND PLAN:    1. Primary hypothyroidism  She reports that her previous physician had difficulty stabilizing her thyroid function tests.  She is currently slightly over replaced, but she is not having any thyrotoxic symptoms.  I am therefore inclined to continue her current dose of levothyroxine and liothyronine for now.  Follow-up in 6 months.    - levothyroxine (SYNTHROID) 100 MCG tablet; Take 1 tablet by mouth every morning (before breakfast)  Dispense: 90 tablet; Refill: 3  - liothyronine (CYTOMEL) 5 MCG tablet; Take 2 tablets by mouth 2 times daily  Dispense: 360 tablet; Refill: 3    2. Hirsutism  She has mild hirsutism.  She also reports a history of irregular menses.  I suspect that she likely has polycystic ovarian syndrome.  Androgen producing tumor, nonclassical congenital adrenal hyperplasia and Cushing's syndrome were excluded.  Oral contraceptive pills are obviously contraindicated given her history of breast cancer.  She had a mild improvement in her hirsutism on spironolactone 100 mg daily.  She reports that her primary care physician decreased her spironolactone to 50 mg daily due to hypotension.  I have recommended that she try increasing her spironolactone to 100 gm daily and stopping her lisinopril.      - spironolactone (ALDACTONE) 50 MG tablet; Take 2 tablets by mouth daily  Dispense: 180 tablet; Refill: 3      Follow-up and Dispositions    Return in about 6 months (around 12/31/2021).         HISTORY OF PRESENT ILLNESS:    THYROID DISEASE     Presentation/Diagnosis: Hypothyroidism diagnosed in her 84s.  She previously saw an endocrinologist in [place], [place].     Symptoms: See review of systems.       Treatment:  Takes generic levothyroxine and liothyronine with other medications.     Imaging: None.     Labs:  08/02/2019: Estradiol 46, FSH 21.8.  08/25/2019: TSH 1.040.  08/30/2019: Free T4 0.98, free T3 4.6, thyroid peroxidase antibodies 13, total testosterone 29, free testosterone 2.6, DHEA-S 138.0, 17-OH progesterone 283.  09/04/2019: Cortisol 1.0, dexamethasone 309.  12/27/2019: TSH 0.416, free T4 1.06, free T3 4.6.  06/27/2020: TSH 1.050, free T4 1.17, free T3 5.8, potassium 4.5, creatinine 0.76 (GFR 94).  07/25/2020: Glucose 190, creatinine 0.82 (GFR 86), total cholesterol 204, triglycerides 145, HDL 45, LDL 133, VLDL 26.  12/11/2020: Hemoglobin A1c 6.2, potassium 5.7, glucose 172, creatinine 1.64 (GFR 37), urine microalbumin/creatinine 78.  12/24/2020: TSH 0.258, free T4 1.06, free T3 4.2, potassium 5.2, glucose 232, creatinine 1.37 (GFR 48).  06/20/2021: TSH 0.228, free T4 1.44, free T3 4.4, sodium 141, potassium 4.2, creatinine 0.65 (GFR 109).     Pregnancy status: No pregnancy plans.      Review of Systems   Constitutional:  Negative for diaphoresis and fatigue.        Weight decreased 32 pounds since last visit through the [employer].   Cardiovascular:  Negative for palpitations.   Gastrointestinal:  Negative for constipation and diarrhea.   Endocrine: Positive for cold intolerance. Negative for heat intolerance.   Genitourinary:  She has not had menses since 2015 after starting chemotherapy.    Skin:         Denies hair loss, dry skin.  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  She had a mild improvement on the higher dose of spironolactone 100 mg daily but her PCP decreased her dose to 50 mg daily because her BP was too low.    Neurological:  Negative for tremors.       Vital Signs:  BP 118/74    Pulse 86    Wt 203 lb 9.6 oz (92.4 kg)    SpO2 100%    BMI 37.24 kg/m??     Wt Readings from Last 3 Encounters:   07/02/21 203 lb 9.6 oz (92.4 kg)   12/30/20 235 lb (106.6 kg)   07/03/20 241 lb 9.6 oz  (109.6 kg)       Physical Exam  Constitutional:       General: She is not in acute distress.  Neck:      Thyroid: No thyroid mass or thyromegaly.   Cardiovascular:      Rate and Rhythm: Normal rate and regular rhythm.   Lymphadenopathy:      Cervical: No cervical adenopathy.   Neurological:      Motor: No tremor.         Orders Placed This Encounter   Procedures    TSH     Standing Status:   Future     Standing Expiration Date:   07/02/2022    T4, Free     Standing Status:   Future     Standing Expiration Date:   07/02/2022    T3, Free     Standing Status:   Future     Standing Expiration Date:   07/02/2022    Basic Metabolic Panel     Standing Status:   Future     Standing Expiration Date:   07/02/2022       Current Outpatient Medications   Medication Sig Dispense Refill    levothyroxine (SYNTHROID) 100 MCG tablet Take 1 tablet by mouth every morning (before breakfast) 90 tablet 3    liothyronine (CYTOMEL) 5 MCG tablet Take 2 tablets by mouth 2 times daily 360 tablet 3    spironolactone (ALDACTONE) 50 MG tablet Take 2 tablets by mouth daily 180 tablet 3    Cetirizine HCl 10 MG CAPS Take by mouth      escitalopram (LEXAPRO) 10 MG tablet Take 10 mg by mouth every evening      Hyoscyamine Sulfate SL 0.125 MG SUBL Place 0.125 mg under the tongue 3 times daily as needed As needed      metFORMIN (GLUCOPHAGE-XR) 500 MG extended release tablet Take 500 mg by mouth 2 times daily (with meals)      tamoxifen (NOLVADEX) 20 MG tablet Take 20 mg by mouth every evening      traZODone (DESYREL) 50 MG tablet Take 50 mg by mouth       No current facility-administered medications for this visit.

## 2021-07-22 ENCOUNTER — Encounter

## 2021-10-02 LAB — HEMOGLOBIN A1C
Estimated Avg Glucose, External: 97 mg/dL
Hemoglobin A1C, External: 5 % (ref 4.8–5.6)

## 2021-12-23 LAB — BASIC METABOLIC PANEL (7)
BUN/Creatinine Ratio: 41 — ABNORMAL HIGH (ref 9–23)
BUN: 28 mg/dL — ABNORMAL HIGH (ref 6–24)
CO2: 22 mmol/L (ref 20–29)
Chloride: 99 mmol/L (ref 96–106)
Creatinine: 0.68 mg/dL (ref 0.57–1.00)
Est, Glomerular Filtration Rate: 107 mL/min/{1.73_m2} (ref >59)
Glucose: 101 mg/dL — ABNORMAL HIGH (ref 70–99)
Potassium: 5 mmol/L (ref 3.5–5.2)
Sodium: 137 mmol/L (ref 134–144)

## 2021-12-23 LAB — TSH + FREE T4 PANEL
T4 Free: 1.18 ng/dL (ref 0.82–1.77)
TSH: 0.634 u[IU]/mL (ref 0.450–4.500)

## 2021-12-23 LAB — T3, FREE: T3, Free: 4.4 pg/mL (ref 2.0–4.4)

## 2021-12-31 ENCOUNTER — Ambulatory Visit
Admit: 2021-12-31 | Discharge: 2021-12-31 | Payer: PRIVATE HEALTH INSURANCE | Attending: "Endocrinology | Primary: Family Medicine

## 2021-12-31 DIAGNOSIS — E039 Hypothyroidism, unspecified: Secondary | ICD-10-CM

## 2021-12-31 MED ORDER — SPIRONOLACTONE 50 MG PO TABS
50 MG | ORAL_TABLET | Freq: Every day | ORAL | 3 refills | Status: AC
Start: 2021-12-31 — End: 2022-12-30

## 2021-12-31 MED ORDER — LEVOTHYROXINE SODIUM 100 MCG PO TABS
100 MCG | ORAL_TABLET | Freq: Every day | ORAL | 3 refills | Status: AC
Start: 2021-12-31 — End: 2022-12-30

## 2021-12-31 MED ORDER — LIOTHYRONINE SODIUM 5 MCG PO TABS
5 MCG | ORAL_TABLET | Freq: Two times a day (BID) | ORAL | 3 refills | Status: AC
Start: 2021-12-31 — End: 2022-12-30

## 2021-12-31 NOTE — Progress Notes (Signed)
Corbin Hott R. [name], MD, [facility]  [address], [address]  [place], [place]  [address]  Phone [phone]  Facsimile [phone]          Ariana Bishop is a 51 y.o. female seen 12/31/2021 for follow up of hypothyroidism        ASSESSMENT AND PLAN:    1. Primary hypothyroidism  She reports that her previous physician had difficulty stabilizing her thyroid function tests.  She is currently clinically and biochemically euthyroid.  Follow-up in 1 year.    - levothyroxine (SYNTHROID) 100 MCG tablet; Take 1 tablet by mouth every morning (before breakfast)  Dispense: 90 tablet; Refill: 3  - liothyronine (CYTOMEL) 5 MCG tablet; Take 2 tablets by mouth 2 times daily  Dispense: 360 tablet; Refill: 3    2. Hirsutism  She has mild hirsutism.  She also reports a history of irregular menses.  I suspect that she likely has polycystic ovarian syndrome.  Androgen producing tumor, nonclassical congenital adrenal hyperplasia and Cushing's syndrome were excluded.  Oral contraceptive pills are obviously contraindicated given her history of breast cancer.  She had a mild improvement in her hirsutism on spironolactone 100 mg daily.  She reports that her primary care physician decreased her spironolactone to 50 mg daily due to hypotension.      - spironolactone (ALDACTONE) 50 MG tablet; Take 1 tablet by mouth daily  Dispense: 90 tablet; Refill: 3      Follow-up and Dispositions    Return in about 1 year (around 01/01/2023).         HISTORY OF PRESENT ILLNESS:    THYROID DISEASE     Presentation/Diagnosis: Hypothyroidism diagnosed in her 13s.  She previously saw an endocrinologist in [place], [place].     Symptoms: See review of systems.       Treatment: Takes generic levothyroxine and liothyronine with other medications.     Imaging: None.     Labs:  08/02/2019: Estradiol 46, FSH 21.8.  08/25/2019: TSH 1.040.  08/30/2019: Free T4 0.98, free T3 4.6, thyroid peroxidase antibodies 13, total  testosterone 29, free testosterone 2.6, DHEA-S 138.0, 17-OH progesterone 283.  09/04/2019: Cortisol 1.0, dexamethasone 309.  12/27/2019: TSH 0.416, free T4 1.06, free T3 4.6.  06/27/2020: TSH 1.050, free T4 1.17, free T3 5.8, potassium 4.5, creatinine 0.76 (GFR 94).  07/25/2020: Glucose 190, creatinine 0.82 (GFR 86), total cholesterol 204, triglycerides 145, HDL 45, LDL 133, VLDL 26.  12/11/2020: Hemoglobin A1c 6.2, potassium 5.7, glucose 172, creatinine 1.64 (GFR 37), urine microalbumin/creatinine 78.  12/24/2020: TSH 0.258, free T4 1.06, free T3 4.2, potassium 5.2, glucose 232, creatinine 1.37 (GFR 48).  06/20/2021: TSH 0.228, free T4 1.44, free T3 4.4, sodium 141, potassium 4.2, creatinine 0.65 (GFR 109).  12/22/2021: TSH 0.634, free T4 1.18, free T3 4.4, sodium 137, potassium 5.0, creatinine 0.68 (GFR 107).     Pregnancy status: No pregnancy plans.      Review of Systems   Constitutional:  Negative for diaphoresis and fatigue.        Weight decreased 17 pounds since last visit ([employer] diet).   Cardiovascular:  Negative for palpitations.   Gastrointestinal:  Negative for constipation and diarrhea.   Endocrine: Negative for cold intolerance and heat intolerance.   Genitourinary:         She has not had menses since 2015 after starting chemotherapy.    Skin:         Denies hair loss, dry  skin.  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  She had a more improvement on the higher dose of spironolactone 100 mg daily but her dose was decreased to 50 mg daily because her BP was too low.    Neurological:  Negative for tremors.       Vital Signs:  BP (!) 141/82 (Site: Left Upper Arm, Position: Sitting)   Pulse 86   Wt 186 lb (84.4 kg)   BMI 34.02 kg/m     Wt Readings from Last 3 Encounters:   12/31/21 186 lb (84.4 kg)   07/02/21 203 lb 9.6 oz (92.4 kg)   12/30/20 235 lb (106.6 kg)       Physical Exam  Constitutional:       General: She is not in acute distress.  Neck:      Thyroid: No thyroid mass or  thyromegaly.   Cardiovascular:      Rate and Rhythm: Normal rate and regular rhythm.   Lymphadenopathy:      Cervical: No cervical adenopathy.   Neurological:      Motor: No tremor.         Orders Placed This Encounter   Procedures    TSH     Standing Status:   Future     Standing Expiration Date:   01/01/2024    T4, Free     Standing Status:   Future     Standing Expiration Date:   01/01/2024    T3, Free     Standing Status:   Future     Standing Expiration Date:   01/01/2024    Basic Metabolic Panel     Standing Status:   Future     Standing Expiration Date:   01/01/2024       Current Outpatient Medications   Medication Sig Dispense Refill    levothyroxine (SYNTHROID) 100 MCG tablet Take 1 tablet by mouth every morning (before breakfast) 90 tablet 3    liothyronine (CYTOMEL) 5 MCG tablet Take 2 tablets by mouth 2 times daily 360 tablet 3    spironolactone (ALDACTONE) 50 MG tablet Take 1 tablet by mouth daily 90 tablet 3    Cetirizine HCl 10 MG CAPS Take by mouth      escitalopram (LEXAPRO) 10 MG tablet Take 1 tablet by mouth every evening      metFORMIN (GLUCOPHAGE-XR) 500 MG extended release tablet Take 1 tablet by mouth 2 times daily (with meals)      tamoxifen (NOLVADEX) 20 MG tablet Take 1 tablet by mouth every evening      traZODone (DESYREL) 50 MG tablet Take 1 tablet by mouth       No current facility-administered medications for this visit.

## 2022-02-18 LAB — HEMOGLOBIN A1C
Estimated Avg Glucose, External: 100 mg/dL
Hemoglobin A1C, External: 5.1 % (ref 4.8–5.6)

## 2022-06-26 LAB — HEMOGLOBIN A1C
Estimated Avg Glucose, External: 120 mg/dL
Hemoglobin A1C, External: 5.8 % — ABNORMAL HIGH (ref 4.8–5.6)

## 2022-09-14 ENCOUNTER — Encounter

## 2022-11-28 ENCOUNTER — Encounter

## 2022-12-01 LAB — BASIC METABOLIC PANEL
BUN/Creatinine Ratio: 25 — ABNORMAL HIGH (ref 9–23)
BUN: 21 mg/dL (ref 6–24)
CO2: 24 mmol/L (ref 20–29)
Calcium: 10.3 mg/dL — ABNORMAL HIGH (ref 8.7–10.2)
Chloride: 98 mmol/L (ref 96–106)
Creatinine: 0.85 mg/dL (ref 0.57–1.00)
Est, Glomerular Filtration Rate: 84 mL/min/{1.73_m2} (ref >59)
Glucose: 126 mg/dL — ABNORMAL HIGH (ref 70–99)
Potassium: 5.3 mmol/L — ABNORMAL HIGH (ref 3.5–5.2)
Sodium: 139 mmol/L (ref 134–144)

## 2022-12-01 LAB — T4, FREE: T4 Free: 1.19 ng/dL (ref 0.82–1.77)

## 2022-12-01 LAB — TSH: TSH: 0.2 u[IU]/mL — ABNORMAL LOW (ref 0.450–4.500)

## 2022-12-01 LAB — T3, FREE: T3, Free: 6.8 pg/mL — ABNORMAL HIGH (ref 2.0–4.4)

## 2022-12-11 LAB — HEMOGLOBIN A1C
Estimated Avg Glucose, External: 120 mg/dL
Hemoglobin A1C, External: 5.8 % — ABNORMAL HIGH (ref 4.8–5.6)

## 2022-12-30 ENCOUNTER — Ambulatory Visit
Admit: 2022-12-30 | Discharge: 2022-12-30 | Payer: PRIVATE HEALTH INSURANCE | Attending: "Endocrinology | Primary: Family Medicine

## 2022-12-30 DIAGNOSIS — E039 Hypothyroidism, unspecified: Secondary | ICD-10-CM

## 2022-12-30 MED ORDER — LIOTHYRONINE SODIUM 5 MCG PO TABS
5 MCG | ORAL_TABLET | ORAL | 3 refills | Status: AC
Start: 2022-12-30 — End: 2023-03-19

## 2022-12-30 MED ORDER — LEVOTHYROXINE SODIUM 100 MCG PO TABS
100 MCG | ORAL_TABLET | Freq: Every day | ORAL | 3 refills | Status: AC
Start: 2022-12-30 — End: 2023-03-19

## 2022-12-30 NOTE — Progress Notes (Signed)
Dalilah Curlin R. [name], MD, [facility]  [address]  [place], [place]  [address]  Phone [phone]  Facsimile [phone]          Ariana Bishop is a 52 y.o. female seen 12/30/2022 for follow up of hypothyroidism        ASSESSMENT AND PLAN:    1. Primary hypothyroidism  She reports that her previous physician had difficulty stabilizing her thyroid function tests.  She is currently over replaced and I will decrease her liothyronine as below.  Follow-up in 3 months.    - levothyroxine (SYNTHROID) 100 MCG tablet; Take 1 tablet by mouth every morning (before breakfast)  Dispense: 90 tablet; Refill: 3  - liothyronine (CYTOMEL) 5 MCG tablet; 2 tablets in the morning and 1 tablet in the afternoon  Dispense: 270 tablet; Refill: 3    2. Hirsutism  She has mild hirsutism.  She also reports a history of irregular menses.  I suspect that she likely has polycystic ovarian syndrome.  Androgen producing tumor, nonclassical congenital adrenal hyperplasia and Cushing's syndrome were excluded.  Oral contraceptive pills are obviously contraindicated given her history of breast cancer.  She had a mild improvement in her hirsutism on spironolactone 100 mg daily.  She reports that her primary care physician decreased her spironolactone to 50 mg daily due to hypotension.  Her potassium level is currently elevated.  Coupled with the fact that she has not noted any significant improvement in her hirsutism, I will have her discontinue spironolactone.    3. Hypercalcemia  Her serum calcium level was elevated on her most recent labs and I will perform a limited workup with her next lab draw.      Follow-up and Dispositions    Return in about 3 months (around 03/31/2023), or Friday afternoon is okay.         HISTORY OF PRESENT ILLNESS:    THYROID DISEASE     Presentation/Diagnosis: Hypothyroidism diagnosed in her 26s.  She previously saw an endocrinologist in [place], [place].     Symptoms:  See review of systems.       Treatment: Takes generic levothyroxine and liothyronine with other medications.     Imaging: None.     Labs:  08/02/2019: Estradiol 46, FSH 21.8.  08/25/2019: TSH 1.040.  08/30/2019: Free T4 0.98, free T3 4.6, thyroid peroxidase antibodies 13, total testosterone 29, free testosterone 2.6, DHEA-S 138.0, 17-OH progesterone 283.  09/04/2019: Cortisol 1.0, dexamethasone 309.  12/27/2019: TSH 0.416, free T4 1.06, free T3 4.6.  06/27/2020: TSH 1.050, free T4 1.17, free T3 5.8, potassium 4.5, creatinine 0.76 (GFR 94).  07/25/2020: Glucose 190, creatinine 0.82 (GFR 86), total cholesterol 204, triglycerides 145, HDL 45, LDL 133, VLDL 26.  12/11/2020: Hemoglobin A1c 6.2, potassium 5.7, glucose 172, creatinine 1.64 (GFR 37), urine microalbumin/creatinine 78.  12/24/2020: TSH 0.258, free T4 1.06, free T3 4.2, potassium 5.2, glucose 232, creatinine 1.37 (GFR 48).  06/20/2021: TSH 0.228, free T4 1.44, free T3 4.4, sodium 141, potassium 4.2, creatinine 0.65 (GFR 109).  12/22/2021: TSH 0.634, free T4 1.18, free T3 4.4, sodium 137, potassium 5.0, creatinine 0.68 (GFR 107).  11/30/2022: TSH 0.200, free T4 1.19, free T3 6.8, sodium 139, potassium 5.3, creatinine 0.85 (GFR 84), calcium 10.3.     Pregnancy status: No pregnancy plans.      Review of Systems   Constitutional:  Negative for diaphoresis and fatigue.        Weight increased 39  pounds since last visit; she stopped the [employer].  Her PCP started her on Mounjaro 4-5 months ago.   Cardiovascular:  Negative for palpitations.   Gastrointestinal:  Negative for constipation and diarrhea.   Endocrine: Negative for cold intolerance and heat intolerance.   Genitourinary:         She has not had menses since 2015 after starting chemotherapy.    Skin:         Denies hair loss, dry skin.  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  She had a more improvement on the higher dose of spironolactone 100 mg daily but her dose was decreased to 50 mg daily  because her BP was too low.    Neurological:  Negative for tremors.         Vital Signs:  BP (!) 140/80 (Site: Right Upper Arm, Position: Sitting, Cuff Size: Large Adult)   Pulse 74   Wt 102.1 kg (225 lb)   BMI 41.15 kg/m     Wt Readings from Last 3 Encounters:   12/30/22 102.1 kg (225 lb)   12/31/21 84.4 kg (186 lb)   07/02/21 92.4 kg (203 lb 9.6 oz)       Physical Exam  Constitutional:       General: She is not in acute distress.  Neck:      Thyroid: No thyroid mass or thyromegaly.   Cardiovascular:      Rate and Rhythm: Normal rate and regular rhythm.   Lymphadenopathy:      Cervical: No cervical adenopathy.   Neurological:      Motor: No tremor.           Orders Placed This Encounter   Procedures    TSH     Standing Status:   Future     Standing Expiration Date:   12/30/2023    T4, Free     Standing Status:   Future     Standing Expiration Date:   12/30/2023    T3, Free     Standing Status:   Future     Standing Expiration Date:   12/30/2023    Comprehensive Metabolic Panel     Standing Status:   Future     Standing Expiration Date:   12/30/2023    Calcium, Ionized     Standing Status:   Future     Standing Expiration Date:   12/30/2023    PTH, Intact     Standing Status:   Future     Standing Expiration Date:   12/30/2023    Phosphorus     Standing Status:   Future     Standing Expiration Date:   12/30/2023    Vitamin D 25 Hydroxy     Standing Status:   Future     Standing Expiration Date:   12/30/2023       Current Outpatient Medications   Medication Sig Dispense Refill    amLODIPine (NORVASC) 5 MG tablet Take 1 tablet by mouth daily      chlorthalidone (HYGROTON) 25 MG tablet       EPINEPHrine (EPIPEN 2-PAK) 0.3 MG/0.3ML SOAJ injection       oxyBUTYnin (DITROPAN-XL) 10 MG extended release tablet       MOUNJARO 5 MG/0.5ML SOPN SC injection INJECT 0.5 ML (5 MG) UNDER THE SKIN ONCE A WEEK      Cetirizine HCl 10 MG CAPS Take by mouth      escitalopram (LEXAPRO) 10 MG tablet  Take 1 tablet by mouth every evening       metFORMIN (GLUCOPHAGE-XR) 500 MG extended release tablet Take 1 tablet by mouth 2 times daily (with meals)      tamoxifen (NOLVADEX) 20 MG tablet Take 1 tablet by mouth every evening      traZODone (DESYREL) 50 MG tablet Take 1 tablet by mouth      levothyroxine (SYNTHROID) 100 MCG tablet Take 1 tablet by mouth every morning (before breakfast) 90 tablet 3    liothyronine (CYTOMEL) 5 MCG tablet 2 tablets in the morning and 1 tablet in the afternoon 270 tablet 3     No current facility-administered medications for this visit.

## 2023-01-01 ENCOUNTER — Encounter: Payer: PRIVATE HEALTH INSURANCE | Primary: Family Medicine

## 2023-01-04 ENCOUNTER — Ambulatory Visit: Payer: PRIVATE HEALTH INSURANCE | Primary: Family Medicine

## 2023-03-16 LAB — TSH: TSH: 0.264 u[IU]/mL — ABNORMAL LOW (ref 0.450–4.500)

## 2023-03-16 LAB — COMPREHENSIVE METABOLIC PANEL
ALT: 24 IU/L (ref 0–32)
AST: 26 IU/L (ref 0–40)
Albumin: 4.1 g/dL (ref 3.9–4.9)
Alkaline Phosphatase: 59 IU/L (ref 44–121)
BUN/Creatinine Ratio: 19 (ref 9–23)
BUN: 15 mg/dL (ref 6–24)
CO2: 24 mmol/L (ref 20–29)
Calcium: 9.2 mg/dL (ref 8.7–10.2)
Chloride: 101 mmol/L (ref 96–106)
Creatinine: 0.77 mg/dL (ref 0.57–1.00)
Est, Glom Filt Rate: 95 mL/min/{1.73_m2} (ref >59)
Globulin, Total: 2.1 g/dL (ref 1.5–4.5)
Glucose: 112 mg/dL — ABNORMAL HIGH (ref 70–99)
Potassium: 4.8 mmol/L (ref 3.5–5.2)
Sodium: 136 mmol/L (ref 134–144)
Total Bilirubin: 0.3 mg/dL (ref 0.0–1.2)
Total Protein: 6.2 g/dL (ref 6.0–8.5)

## 2023-03-16 LAB — PTH, INTACT: Pth Intact: 41 pg/mL (ref 15–65)

## 2023-03-16 LAB — T3: T3, Total: 151 ng/dL (ref 71–180)

## 2023-03-16 LAB — PHOSPHORUS: Phosphorus: 3.5 mg/dL (ref 3.0–4.3)

## 2023-03-16 LAB — T4: Thyroxine (T4): 8.6 ug/dL (ref 4.5–12.0)

## 2023-03-16 LAB — VITAMIN D 25 HYDROXY: Vit D, 25-Hydroxy: 25.9 ng/mL — ABNORMAL LOW (ref 30.0–100.0)

## 2023-03-17 LAB — CALCIUM+CALCIUM, IONIZED: Calcium, Ionized: 4.9 mg/dL (ref 4.5–5.6)

## 2023-03-19 ENCOUNTER — Ambulatory Visit
Admit: 2023-03-19 | Discharge: 2023-03-19 | Payer: PRIVATE HEALTH INSURANCE | Attending: "Endocrinology | Primary: Family Medicine

## 2023-03-19 DIAGNOSIS — E039 Hypothyroidism, unspecified: Secondary | ICD-10-CM

## 2023-03-19 MED ORDER — LEVOTHYROXINE SODIUM 100 MCG PO TABS
100 | ORAL_TABLET | Freq: Every day | ORAL | 3 refills | Status: AC
Start: 2023-03-19 — End: ?

## 2023-03-19 MED ORDER — CHOLECALCIFEROL 50 MCG (2000 UT) PO CAPS
50 | ORAL_CAPSULE | Freq: Every day | ORAL | 0 refills | Status: AC
Start: 2023-03-19 — End: ?

## 2023-03-19 MED ORDER — LIOTHYRONINE SODIUM 5 MCG PO TABS
5 | ORAL_TABLET | ORAL | 3 refills | Status: AC
Start: 2023-03-19 — End: ?

## 2023-03-19 NOTE — Progress Notes (Signed)
Nereyda Bowler R. [name], MD, [facility]  [address]  [place], [place]  [address]  Phone [phone]  Facsimile [phone]          Ariana Bishop is a 52 y.o. female seen 03/19/2023 for follow up of hypothyroidism        ASSESSMENT AND PLAN:    1. Primary hypothyroidism  She reports that her previous physician had difficulty stabilizing her thyroid function tests.  She is currently over replaced and I will decrease her liothyronine as below.  Follow-up in 4 months.    - levothyroxine (SYNTHROID) 100 MCG tablet; Take 1 tablet by mouth every morning (before breakfast)  Dispense: 90 tablet; Refill: 3  - liothyronine (CYTOMEL) 5 MCG tablet; 1 tablet in the morning and 1 tablet in the afternoon  Dispense: 180 tablet; Refill: 3    2. Hirsutism  She has mild hirsutism.  She also reports a history of irregular menses.  I suspect that she likely has polycystic ovarian syndrome.  Androgen producing tumor, nonclassical congenital adrenal hyperplasia and Cushing's syndrome were excluded.  Oral contraceptive pills are obviously contraindicated given her history of breast cancer.  She had a mild improvement in her hirsutism on spironolactone 100 mg daily.  She reports that her primary care physician decreased her spironolactone to 50 mg daily due to hypotension.  Her potassium level became elevated but she did not have any significant improvement in her hirsutism on spironolactone 50 mg daily.  I therefore had her discontinue her spironolactone.    3. Hypercalcemia  Her most recent serum calcium and parathyroid hormone levels were normal.    4. Vitamin D deficiency  I will have her start over-the-counter vitamin D3 as below.    - vitamin D (CHOLECALCIFEROL) 50 MCG (2000 UT) CAPS capsule; Take 1 capsule by mouth daily  Dispense: 1 capsule; Refill: 0      Follow-up and Dispositions    Return in about 4 months (around 07/20/2023).         HISTORY OF PRESENT ILLNESS:    THYROID  DISEASE     Presentation/Diagnosis: Hypothyroidism diagnosed in her 85s.  She previously saw an endocrinologist in [place], [place].     Symptoms: See review of systems.       Treatment: Takes generic levothyroxine and liothyronine with other medications.     Imaging: None.     Labs:  08/02/2019: Estradiol 46, FSH 21.8.  08/25/2019: TSH 1.040.  08/30/2019: Free T4 0.98, free T3 4.6, thyroid peroxidase antibodies 13, total testosterone 29, free testosterone 2.6, DHEA-S 138.0, 17-OH progesterone 283.  09/04/2019: Cortisol 1.0, dexamethasone 309.  12/27/2019: TSH 0.416, free T4 1.06, free T3 4.6.  06/27/2020: TSH 1.050, free T4 1.17, free T3 5.8, potassium 4.5, creatinine 0.76 (GFR 94).  07/25/2020: Glucose 190, creatinine 0.82 (GFR 86), total cholesterol 204, triglycerides 145, HDL 45, LDL 133, VLDL 26.  12/11/2020: Hemoglobin A1c 6.2, potassium 5.7, glucose 172, creatinine 1.64 (GFR 37), urine microalbumin/creatinine 78.  12/24/2020: TSH 0.258, free T4 1.06, free T3 4.2, potassium 5.2, glucose 232, creatinine 1.37 (GFR 48).  06/20/2021: TSH 0.228, free T4 1.44, free T3 4.4, sodium 141, potassium 4.2, creatinine 0.65 (GFR 109).  12/22/2021: TSH 0.634, free T4 1.18, free T3 4.4, sodium 137, potassium 5.0, creatinine 0.68 (GFR 107).  11/30/2022: TSH 0.200, free T4 1.19, free T3 6.8, sodium 139, potassium 5.3, creatinine 0.85 (GFR 84), calcium 10.3.  03/15/2023: TSH 0.264, total T4 8.6, total T3  151, calcium 9.2, ionized calcium 4.1, phosphorus 3.5, creatinine 0.77 (GFR 95), PTH 41, 25-OH vitamin D 25.9, potassium 4.8.     Pregnancy status: No pregnancy plans.      Review of Systems   Constitutional:  Positive for fatigue (mild). Negative for diaphoresis and unexpected weight change.   Cardiovascular:  Negative for palpitations.   Gastrointestinal:  Negative for constipation and diarrhea.   Endocrine: Negative for cold intolerance and heat intolerance.   Genitourinary:         She has not had menses since 2015 after starting  chemotherapy.    Skin:         Denies hair loss, dry skin.  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  She had more improvement on the higher dose of spironolactone 100 mg daily but her dose was decreased to 50 mg daily because her BP was too low.  Spironolactone was stopped and she has not had a significant worsening of her hirsutism.   Neurological:  Negative for tremors.         Vital Signs:  BP 122/84 (Site: Left Upper Arm, Position: Sitting, Cuff Size: Large Adult)   Pulse 94   Wt 102.3 kg (225 lb 9.6 oz)   SpO2 94%   BMI 41.26 kg/m     Wt Readings from Last 3 Encounters:   03/19/23 102.3 kg (225 lb 9.6 oz)   12/30/22 102.1 kg (225 lb)   12/31/21 84.4 kg (186 lb)       Physical Exam  Constitutional:       General: She is not in acute distress.  Neck:      Thyroid: No thyroid mass or thyromegaly.   Cardiovascular:      Rate and Rhythm: Normal rate and regular rhythm.   Lymphadenopathy:      Cervical: No cervical adenopathy.   Neurological:      Motor: No tremor.           Orders Placed This Encounter   Procedures    TSH     Standing Status:   Future     Standing Expiration Date:   03/18/2024    T4, Free     Standing Status:   Future     Standing Expiration Date:   03/18/2024    T3, Free     Standing Status:   Future     Standing Expiration Date:   03/18/2024    Vitamin D 25 Hydroxy     Standing Status:   Future     Standing Expiration Date:   03/18/2024       Current Outpatient Medications   Medication Sig Dispense Refill    MOUNJARO 10 MG/0.5ML SOPN SC injection Inject 0.5 mLs into the skin once a week      lisinopril (PRINIVIL;ZESTRIL) 20 MG tablet Take 1 tablet by mouth daily      levothyroxine (SYNTHROID) 100 MCG tablet Take 1 tablet by mouth every morning (before breakfast) 90 tablet 3    liothyronine (CYTOMEL) 5 MCG tablet 1 tablet in the morning and 1 tablet in the afternoon 180 tablet 3    vitamin D (CHOLECALCIFEROL) 50 MCG (2000 UT) CAPS capsule Take 1 capsule by mouth daily 1 capsule 0     Cetirizine HCl 10 MG CAPS Take by mouth      escitalopram (LEXAPRO) 10 MG tablet Take 1 tablet by mouth every evening      metFORMIN (GLUCOPHAGE-XR) 500 MG extended release tablet Take  1 tablet by mouth 2 times daily (with meals)      tamoxifen (NOLVADEX) 20 MG tablet Take 1 tablet by mouth every evening      traZODone (DESYREL) 50 MG tablet Take 1 tablet by mouth       No current facility-administered medications for this visit.

## 2023-04-02 LAB — HEMOGLOBIN A1C
Estimated Avg Glucose, External: 111 mg/dL
Hemoglobin A1C, External: 5.5 % (ref 4.8–5.6)

## 2023-07-06 LAB — HEMOGLOBIN A1C
Estimated Avg Glucose, External: 117 mg/dL
Hemoglobin A1C, External: 5.7 % — ABNORMAL HIGH (ref 4.8–5.6)

## 2023-09-21 ENCOUNTER — Ambulatory Visit
Admit: 2023-09-21 | Discharge: 2023-09-21 | Payer: PRIVATE HEALTH INSURANCE | Attending: "Endocrinology | Admitting: "Endocrinology | Primary: Family Medicine

## 2023-09-21 VITALS — BP 104/72 | HR 78 | Resp 20 | Ht 62.0 in | Wt 228.0 lb

## 2023-09-21 DIAGNOSIS — E039 Hypothyroidism, unspecified: Principal | ICD-10-CM

## 2023-09-21 MED ORDER — CHOLECALCIFEROL 50 MCG (2000 UT) PO CAPS
50 | ORAL_CAPSULE | Freq: Every day | ORAL | 0 refills | Status: DC
Start: 2023-09-21 — End: 2024-03-28

## 2023-09-21 MED ORDER — LIOTHYRONINE SODIUM 5 MCG PO TABS
5 | ORAL_TABLET | ORAL | 3 refills | 90.00000 days | Status: DC
Start: 2023-09-21 — End: 2024-03-28

## 2023-09-21 MED ORDER — LEVOTHYROXINE SODIUM 100 MCG PO TABS
100 | ORAL_TABLET | Freq: Every day | ORAL | 3 refills | Status: DC
Start: 2023-09-21 — End: 2024-03-28

## 2023-09-21 NOTE — Progress Notes (Signed)
 Hanson Medeiros R. Teresa, MD, [facility]  [address]  [place], [place]  [address]  Phone [phone]  Facsimile [phone]          Ariana Bishop is a 53 y.o. female seen 09/21/2023 for follow up of hypothyroidism        ASSESSMENT AND PLAN:    1. Primary hypothyroidism  She reports that her previous physician had difficulty stabilizing her thyroid function tests.  She is currently clinically and biochemically euthyroid.  Follow-up in 6 months.    - levothyroxine  (SYNTHROID ) 100 MCG tablet; Take 1 tablet by mouth every morning (before breakfast)  Dispense: 90 tablet; Refill: 3  - liothyronine  (CYTOMEL ) 5 MCG tablet; 1 tablet in the morning and 1 tablet in the afternoon  Dispense: 180 tablet; Refill: 3    2. Vitamin D deficiency  Vitamin D level normal 08/2023.    - vitamin D (CHOLECALCIFEROL ) 50 MCG (2000 UT) CAPS capsule; Take 1 capsule by mouth daily  Dispense: 1 capsule; Refill: 0      Follow-up and Dispositions    Return in about 6 months (around 03/20/2024).         HISTORY OF PRESENT ILLNESS:    THYROID DISEASE     Presentation/Diagnosis: Hypothyroidism diagnosed in her 85s.  She previously saw an endocrinologist in [place], [place].     Symptoms: See review of systems.       Treatment: Takes generic levothyroxine  and liothyronine  with other medications.     Imaging:   Thyroid ultrasound 09/14/2023: Right lobe 3.6 x 1.0 x 1.0 cm, left lobe 4.3 x 1.1 x 0.8 cm.  Thyroid echotexture is heterogeneous.  No thyroid nodules.  No abnormal lymph nodes.     Labs:  08/02/2019: Estradiol 46, FSH 21.8.  08/25/2019: TSH 1.040.  08/30/2019: Free T4 0.98, free T3 4.6, thyroid peroxidase antibodies 13, total testosterone 29, free testosterone 2.6, DHEA-S 138.0, 17-OH progesterone 283.  09/04/2019: Cortisol 1.0, dexamethasone 309.  12/27/2019: TSH 0.416, free T4 1.06, free T3 4.6.  06/27/2020: TSH 1.050, free T4 1.17, free T3 5.8, potassium 4.5, creatinine 0.76 (GFR  94).  07/25/2020: Glucose 190, creatinine 0.82 (GFR 86), total cholesterol 204, triglycerides 145, HDL 45, LDL 133, VLDL 26.  12/11/2020: Hemoglobin A1c 6.2, potassium 5.7, glucose 172, creatinine 1.64 (GFR 37), urine microalbumin/creatinine 78.  12/24/2020: TSH 0.258, free T4 1.06, free T3 4.2, potassium 5.2, glucose 232, creatinine 1.37 (GFR 48).  06/20/2021: TSH 0.228, free T4 1.44, free T3 4.4, sodium 141, potassium 4.2, creatinine 0.65 (GFR 109).  12/22/2021: TSH 0.634, free T4 1.18, free T3 4.4, sodium 137, potassium 5.0, creatinine 0.68 (GFR 107).  11/30/2022: TSH 0.200, free T4 1.19, free T3 6.8, sodium 139, potassium 5.3, creatinine 0.85 (GFR 84), calcium 10.3.  03/15/2023: TSH 0.264, total T4 8.6, total T3 151, calcium 9.2, ionized calcium 4.1, phosphorus 3.5, creatinine 0.77 (GFR 95), PTH 41, 25-OH vitamin D 25.9, potassium 4.8.  07/06/2023: TSH 0.699.  09/13/2023: TSH 0.589, free T4 1.50, free T3 5.1, 25-OH vitamin D 59.9.     Pregnancy status: No pregnancy plans.      Review of Systems   Constitutional:  Positive for fatigue. Negative for diaphoresis.        Weight increased 3 pounds since last visit.  She started [employer] last week.   Cardiovascular:  Negative for palpitations.   Gastrointestinal:  Negative for constipation and diarrhea.   Endocrine: Negative for cold intolerance and heat  intolerance.   Genitourinary:         She has not had menses since 2015 after starting chemotherapy.    Skin:         She reports hair thinning, dry skin.  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  She had more improvement on the higher dose of spironolactone  100 mg daily but her dose was decreased to 50 mg daily because her BP was too low.  Spironolactone  was stopped and she has not had a significant worsening of her hirsutism.   Neurological:  Negative for tremors.         Vital Signs:  BP 104/72 (Site: Left Upper Arm, Position: Sitting, Cuff Size: Medium Adult)   Pulse 78   Resp 20   Ht 1.575 m (5'  2)   Wt 103.4 kg (228 lb)   SpO2 98%   BMI 41.70 kg/m     Wt Readings from Last 3 Encounters:   09/21/23 103.4 kg (228 lb)   03/19/23 102.3 kg (225 lb 9.6 oz)   12/30/22 102.1 kg (225 lb)       Physical Exam  Constitutional:       General: She is not in acute distress.  Neck:      Thyroid: No thyroid mass or thyromegaly.   Cardiovascular:      Rate and Rhythm: Normal rate and regular rhythm.   Lymphadenopathy:      Cervical: No cervical adenopathy.   Neurological:      Motor: No tremor.           Orders Placed This Encounter   Procedures    TSH     Standing Status:   Future     Standing Expiration Date:   09/20/2024    T4, Free     Standing Status:   Future     Standing Expiration Date:   09/20/2024    T3, Free     Standing Status:   Future     Standing Expiration Date:   09/20/2024    Vitamin D 25 Hydroxy     Standing Status:   Future     Standing Expiration Date:   09/20/2024       Current Outpatient Medications   Medication Sig Dispense Refill    MOUNJARO 12.5 MG/0.5ML SOAJ INJECT 0.5 ML (12.5 MG) UNDER THE SKIN EVERY 7 DAYS      VEOZAH 45 MG TABS Take 1 tablet by mouth daily      diclofenac (VOLTAREN) 75 MG EC tablet TAKE 1 TABLET (75 MG) BY MOUTH 2 (TWO) TIMES A DAY      levothyroxine  (SYNTHROID ) 100 MCG tablet Take 1 tablet by mouth every morning (before breakfast) 90 tablet 3    liothyronine  (CYTOMEL ) 5 MCG tablet 1 tablet in the morning and 1 tablet in the afternoon 180 tablet 3    vitamin D (CHOLECALCIFEROL ) 50 MCG (2000 UT) CAPS capsule Take 1 capsule by mouth daily 1 capsule 0    lisinopril (PRINIVIL;ZESTRIL) 20 MG tablet Take 1 tablet by mouth daily      Cetirizine HCl 10 MG CAPS Take by mouth      escitalopram (LEXAPRO) 10 MG tablet Take 1 tablet by mouth every evening      metFORMIN (GLUCOPHAGE-XR) 500 MG extended release tablet Take 1 tablet by mouth 2 times daily (with meals)      traZODone (DESYREL) 50 MG tablet Take 1 tablet by mouth  No current facility-administered medications for this visit.

## 2023-11-02 LAB — HEMOGLOBIN A1C
Estimated Avg Glucose, External: 114 mg/dL
Hemoglobin A1C, External: 5.6 % (ref 4.8–5.6)

## 2024-02-11 LAB — HEMOGLOBIN A1C
Estimated Avg Glucose, External: 123 mg/dL
Hemoglobin A1C, External: 5.9 % — ABNORMAL HIGH (ref 4.8–5.6)

## 2024-02-27 ENCOUNTER — Encounter

## 2024-03-21 LAB — TSH: TSH: 3.06 u[IU]/mL (ref 0.450–4.500)

## 2024-03-21 LAB — T4, FREE: T4 Free: 0.99 ng/dL (ref 0.82–1.77)

## 2024-03-21 LAB — T3, FREE: T3, Free: 3 pg/mL (ref 2.0–4.4)

## 2024-03-25 LAB — VITAMIN D 25 HYDROXY: Vit D, 25-Hydroxy: 54.6 ng/mL (ref 30.0–100.0)

## 2024-03-28 ENCOUNTER — Ambulatory Visit
Admit: 2024-03-28 | Discharge: 2024-03-28 | Payer: PRIVATE HEALTH INSURANCE | Attending: "Endocrinology | Primary: Family Medicine

## 2024-03-28 VITALS — BP 114/76 | HR 100 | Resp 20 | Ht 62.0 in | Wt 228.0 lb

## 2024-03-28 DIAGNOSIS — E039 Hypothyroidism, unspecified: Principal | ICD-10-CM

## 2024-03-28 MED ORDER — LIOTHYRONINE SODIUM 5 MCG PO TABS
5 | ORAL_TABLET | ORAL | 3 refills | 90.00000 days | Status: AC
Start: 2024-03-28 — End: ?

## 2024-03-28 MED ORDER — LEVOTHYROXINE SODIUM 100 MCG PO TABS
100 | ORAL_TABLET | Freq: Every day | ORAL | 3 refills | 90.00000 days | Status: AC
Start: 2024-03-28 — End: ?

## 2024-03-28 MED ORDER — CHOLECALCIFEROL 50 MCG (2000 UT) PO CAPS
50 | ORAL_CAPSULE | Freq: Every day | ORAL | 0 refills | Status: AC
Start: 2024-03-28 — End: ?

## 2024-03-28 NOTE — Progress Notes (Signed)
 Sharlet Notaro R. Teresa, MD, [facility]  [address], [address]  [place], [place]  [address]  Phone [phone]  Facsimile [phone]          Ariana Bishop is a 53 y.o. female seen 03/28/2024 for follow up of hypothyroidism        ASSESSMENT AND PLAN:    1. Primary hypothyroidism  She reports that her previous physician had difficulty stabilizing her thyroid function tests.  She is currently clinically and biochemically euthyroid.  Follow-up in 1 year.    - levothyroxine  (SYNTHROID ) 100 MCG tablet; Take 1 tablet by mouth every morning (before breakfast)  Dispense: 90 tablet; Refill: 3  - liothyronine  (CYTOMEL ) 5 MCG tablet; 1 tablet in the morning and 1 tablet in the afternoon  Dispense: 180 tablet; Refill: 3    2. Vitamin D deficiency  Vitamin D level normal 03/2024.    - vitamin D (CHOLECALCIFEROL ) 50 MCG (2000 UT) CAPS capsule; Take 1 capsule by mouth daily  Dispense: 1 capsule; Refill: 0      Follow-up and Dispositions    Return in about 1 year (around 03/28/2025).         HISTORY OF PRESENT ILLNESS:    THYROID DISEASE     Presentation/Diagnosis: Hypothyroidism diagnosed in her 108s.  She previously saw an endocrinologist in [place], [place].     Symptoms: See review of systems.       Treatment: Takes generic levothyroxine  and liothyronine  with other medications.     Imaging:   Thyroid ultrasound 09/14/2023: Right lobe 3.6 x 1.0 x 1.0 cm, left lobe 4.3 x 1.1 x 0.8 cm.  Thyroid echotexture is heterogeneous.  No thyroid nodules.  No abnormal lymph nodes.     Labs:  08/02/2019: Estradiol 46, FSH 21.8.  08/25/2019: TSH 1.040.  08/30/2019: Free T4 0.98, free T3 4.6, thyroid peroxidase antibodies 13, total testosterone 29, free testosterone 2.6, DHEA-S 138.0, 17-OH progesterone 283.  09/04/2019: Cortisol 1.0, dexamethasone 309.  12/27/2019: TSH 0.416, free T4 1.06, free T3 4.6.  06/27/2020: TSH 1.050, free T4 1.17, free T3 5.8, potassium 4.5, creatinine 0.76 (GFR  94).  07/25/2020: Glucose 190, creatinine 0.82 (GFR 86), total cholesterol 204, triglycerides 145, HDL 45, LDL 133, VLDL 26.  12/11/2020: Hemoglobin A1c 6.2, potassium 5.7, glucose 172, creatinine 1.64 (GFR 37), urine microalbumin/creatinine 78.  12/24/2020: TSH 0.258, free T4 1.06, free T3 4.2, potassium 5.2, glucose 232, creatinine 1.37 (GFR 48).  06/20/2021: TSH 0.228, free T4 1.44, free T3 4.4, sodium 141, potassium 4.2, creatinine 0.65 (GFR 109).  12/22/2021: TSH 0.634, free T4 1.18, free T3 4.4, sodium 137, potassium 5.0, creatinine 0.68 (GFR 107).  11/30/2022: TSH 0.200, free T4 1.19, free T3 6.8, sodium 139, potassium 5.3, creatinine 0.85 (GFR 84), calcium 10.3.  03/15/2023: TSH 0.264, total T4 8.6, total T3 151, calcium 9.2, ionized calcium 4.1, phosphorus 3.5, creatinine 0.77 (GFR 95), PTH 41, 25-OH vitamin D 25.9, potassium 4.8.  07/06/2023: TSH 0.699.  09/13/2023: TSH 0.589, free T4 1.50, free T3 5.1, 25-OH vitamin D 59.9.  03/20/2024: TSH 3.060, free T4 0.99, free T3 3.0, 25-OH vitamin D 54.6.     Pregnancy status: No pregnancy plans.      Review of Systems   Constitutional:  Negative for diaphoresis, fatigue and unexpected weight change.   Cardiovascular:  Negative for palpitations.   Gastrointestinal:  Negative for constipation and diarrhea.   Endocrine: Negative for cold intolerance and heat intolerance.   Genitourinary:  She has not had menses since 2015 after starting chemotherapy.    Skin:         She reports hair thinning.  Denies dry skin.  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  She had more improvement on the higher dose of spironolactone  100 mg daily but her dose was decreased to 50 mg daily because her BP was too low.  Spironolactone  was stopped and she has not had a significant worsening of her hirsutism.   Neurological:  Negative for tremors.       Vital Signs:  BP 114/76 (BP Site: Left Upper Arm, Patient Position: Sitting, BP Cuff Size: Large Adult)   Pulse 100   Resp 20    Ht 1.575 m (5' 2)   Wt 103.4 kg (228 lb)   SpO2 98%   BMI 41.70 kg/m     Wt Readings from Last 3 Encounters:   03/28/24 103.4 kg (228 lb)   09/21/23 103.4 kg (228 lb)   03/19/23 102.3 kg (225 lb 9.6 oz)       Physical Exam  Constitutional:       General: She is not in acute distress.  Neck:      Thyroid: No thyroid mass or thyromegaly.   Cardiovascular:      Rate and Rhythm: Normal rate and regular rhythm.   Lymphadenopathy:      Cervical: No cervical adenopathy.   Neurological:      Motor: No tremor.         Orders Placed This Encounter   Procedures    TSH     Standing Status:   Future     Expected Date:   03/28/2025     Expiration Date:   03/28/2026    T4, Free     Standing Status:   Future     Expected Date:   03/28/2025     Expiration Date:   03/28/2026    T3, Free     Standing Status:   Future     Expected Date:   03/28/2025     Expiration Date:   03/28/2026    Vitamin D 25 Hydroxy     Standing Status:   Future     Expected Date:   03/28/2025     Expiration Date:   03/28/2026       Current Outpatient Medications   Medication Sig Dispense Refill    levothyroxine  (SYNTHROID ) 100 MCG tablet Take 1 tablet by mouth every morning (before breakfast) 90 tablet 3    liothyronine  (CYTOMEL ) 5 MCG tablet 1 tablet in the morning and 1 tablet in the afternoon 180 tablet 3    vitamin D (CHOLECALCIFEROL ) 50 MCG (2000 UT) CAPS capsule Take 1 capsule by mouth daily 1 capsule 0    Tirzepatide (MOUNJARO) 15 MG/0.5ML SOAJ pen       diclofenac (VOLTAREN) 75 MG EC tablet TAKE 1 TABLET (75 MG) BY MOUTH 2 (TWO) TIMES A DAY      lisinopril (PRINIVIL;ZESTRIL) 20 MG tablet Take 1 tablet by mouth daily      Cetirizine HCl 10 MG CAPS Take by mouth      escitalopram (LEXAPRO) 10 MG tablet Take 1 tablet by mouth every evening      metFORMIN (GLUCOPHAGE-XR) 500 MG extended release tablet Take 1 tablet by mouth 2 times daily (with meals)      traZODone (DESYREL) 50 MG tablet Take 1 tablet by mouth      VEOZAH 45 MG  TABS Take 1 tablet by mouth  daily (Patient not taking: Reported on 03/28/2024)       No current facility-administered medications for this visit.
